# Patient Record
Sex: Male | Born: 1945
Health system: Southern US, Community
[De-identification: ages and names within clinical notes are randomized; demographics above are authoritative.]

## PROBLEM LIST (undated history)

## (undated) DIAGNOSIS — I2699 Other pulmonary embolism without acute cor pulmonale: Secondary | ICD-10-CM

## (undated) DIAGNOSIS — Z8489 Family history of other specified conditions: Secondary | ICD-10-CM

## (undated) DIAGNOSIS — R42 Dizziness and giddiness: Secondary | ICD-10-CM

## (undated) DIAGNOSIS — M87059 Idiopathic aseptic necrosis of unspecified femur: Secondary | ICD-10-CM

## (undated) DIAGNOSIS — Z7901 Long term (current) use of anticoagulants: Secondary | ICD-10-CM

## (undated) DIAGNOSIS — I214 Non-ST elevation (NSTEMI) myocardial infarction: Secondary | ICD-10-CM

## (undated) DIAGNOSIS — Z87442 Personal history of urinary calculi: Secondary | ICD-10-CM

## (undated) DIAGNOSIS — T883XXA Malignant hyperthermia due to anesthesia, initial encounter: Secondary | ICD-10-CM

## (undated) DIAGNOSIS — I82409 Acute embolism and thrombosis of unspecified deep veins of unspecified lower extremity: Secondary | ICD-10-CM

## (undated) DIAGNOSIS — E785 Hyperlipidemia, unspecified: Secondary | ICD-10-CM

## (undated) HISTORY — DX: Dizziness and giddiness: R42

## (undated) HISTORY — DX: Long term (current) use of anticoagulants: Z79.01

## (undated) HISTORY — PX: CATARACT EXTRACTION, BILATERAL: SHX1313

## (undated) HISTORY — DX: Other pulmonary embolism without acute cor pulmonale: I26.99

## (undated) HISTORY — DX: Acute embolism and thrombosis of unspecified deep veins of unspecified lower extremity: I82.409

---

## 2003-03-24 HISTORY — PX: COLONOSCOPY: SHX174

## 2004-02-28 ENCOUNTER — Ambulatory Visit (HOSPITAL_COMMUNITY): Admission: RE | Admit: 2004-02-28 | Discharge: 2004-02-28 | Payer: Self-pay | Admitting: Gastroenterology

## 2004-06-12 ENCOUNTER — Ambulatory Visit: Payer: Self-pay | Admitting: Cardiology

## 2004-06-17 ENCOUNTER — Ambulatory Visit: Payer: Self-pay

## 2006-03-23 HISTORY — PX: CERVICAL FUSION: SHX112

## 2006-07-15 ENCOUNTER — Inpatient Hospital Stay (HOSPITAL_COMMUNITY): Admission: RE | Admit: 2006-07-15 | Discharge: 2006-07-16 | Payer: Self-pay | Admitting: Neurosurgery

## 2007-03-24 DIAGNOSIS — I2699 Other pulmonary embolism without acute cor pulmonale: Secondary | ICD-10-CM

## 2007-03-24 DIAGNOSIS — I82409 Acute embolism and thrombosis of unspecified deep veins of unspecified lower extremity: Secondary | ICD-10-CM

## 2007-03-24 HISTORY — DX: Other pulmonary embolism without acute cor pulmonale: I26.99

## 2007-03-24 HISTORY — PX: BACK SURGERY: SHX140

## 2007-03-24 HISTORY — DX: Acute embolism and thrombosis of unspecified deep veins of unspecified lower extremity: I82.409

## 2007-11-08 ENCOUNTER — Emergency Department (HOSPITAL_COMMUNITY): Admission: EM | Admit: 2007-11-08 | Discharge: 2007-11-08 | Payer: Self-pay | Admitting: Emergency Medicine

## 2007-11-08 ENCOUNTER — Encounter: Admission: RE | Admit: 2007-11-08 | Discharge: 2007-11-08 | Payer: Self-pay | Admitting: Specialist

## 2007-11-11 ENCOUNTER — Inpatient Hospital Stay (HOSPITAL_COMMUNITY): Admission: AD | Admit: 2007-11-11 | Discharge: 2007-11-15 | Payer: Self-pay | Admitting: Family Medicine

## 2008-02-03 ENCOUNTER — Ambulatory Visit (HOSPITAL_COMMUNITY): Payer: Self-pay | Admitting: Oncology

## 2008-02-03 ENCOUNTER — Encounter (HOSPITAL_COMMUNITY): Admission: RE | Admit: 2008-02-03 | Discharge: 2008-03-04 | Payer: Self-pay | Admitting: Oncology

## 2008-02-13 ENCOUNTER — Ambulatory Visit: Payer: Self-pay | Admitting: Vascular Surgery

## 2008-02-28 ENCOUNTER — Ambulatory Visit: Payer: Self-pay | Admitting: Surgery

## 2008-02-28 ENCOUNTER — Ambulatory Visit (HOSPITAL_COMMUNITY): Admission: RE | Admit: 2008-02-28 | Discharge: 2008-02-28 | Payer: Self-pay | Admitting: Surgery

## 2008-03-02 ENCOUNTER — Ambulatory Visit: Payer: Self-pay | Admitting: Oncology

## 2008-03-02 ENCOUNTER — Inpatient Hospital Stay (HOSPITAL_COMMUNITY): Admission: AD | Admit: 2008-03-02 | Discharge: 2008-03-04 | Payer: Self-pay | Admitting: Specialist

## 2008-03-06 ENCOUNTER — Encounter (HOSPITAL_COMMUNITY): Admission: RE | Admit: 2008-03-06 | Discharge: 2008-04-05 | Payer: Self-pay | Admitting: Oncology

## 2008-03-23 HISTORY — PX: COLONOSCOPY: SHX174

## 2008-04-02 ENCOUNTER — Ambulatory Visit (HOSPITAL_COMMUNITY): Payer: Self-pay | Admitting: Oncology

## 2008-04-02 ENCOUNTER — Encounter (HOSPITAL_COMMUNITY): Admission: RE | Admit: 2008-04-02 | Discharge: 2008-05-02 | Payer: Self-pay | Admitting: Oncology

## 2008-04-16 ENCOUNTER — Ambulatory Visit: Payer: Self-pay | Admitting: Surgery

## 2008-05-15 ENCOUNTER — Ambulatory Visit (HOSPITAL_COMMUNITY): Admission: RE | Admit: 2008-05-15 | Discharge: 2008-05-15 | Payer: Self-pay | Admitting: Surgery

## 2008-05-15 ENCOUNTER — Ambulatory Visit: Payer: Self-pay | Admitting: Surgery

## 2008-09-01 ENCOUNTER — Emergency Department (HOSPITAL_COMMUNITY): Admission: EM | Admit: 2008-09-01 | Discharge: 2008-09-01 | Payer: Self-pay | Admitting: Emergency Medicine

## 2008-11-05 ENCOUNTER — Encounter: Payer: Self-pay | Admitting: Internal Medicine

## 2008-11-09 ENCOUNTER — Encounter (HOSPITAL_COMMUNITY): Admission: RE | Admit: 2008-11-09 | Discharge: 2008-12-09 | Payer: Self-pay | Admitting: Oncology

## 2008-11-09 ENCOUNTER — Ambulatory Visit (HOSPITAL_COMMUNITY): Payer: Self-pay | Admitting: Oncology

## 2008-11-23 ENCOUNTER — Ambulatory Visit: Payer: Self-pay | Admitting: Internal Medicine

## 2008-11-23 ENCOUNTER — Encounter: Payer: Self-pay | Admitting: Internal Medicine

## 2008-11-23 ENCOUNTER — Ambulatory Visit (HOSPITAL_COMMUNITY): Admission: RE | Admit: 2008-11-23 | Discharge: 2008-11-23 | Payer: Self-pay | Admitting: Internal Medicine

## 2008-11-29 ENCOUNTER — Encounter: Payer: Self-pay | Admitting: Internal Medicine

## 2009-06-27 IMAGING — CR DG CHEST 2V
2 series · 2 of 2 positions shown · non-contrast
Comparison: Portable chest x-ray of 11/12/2007

CLINICAL DATA: Preop for lumbar spine surgery

CHEST - 2 VIEW

[w chest pa *]
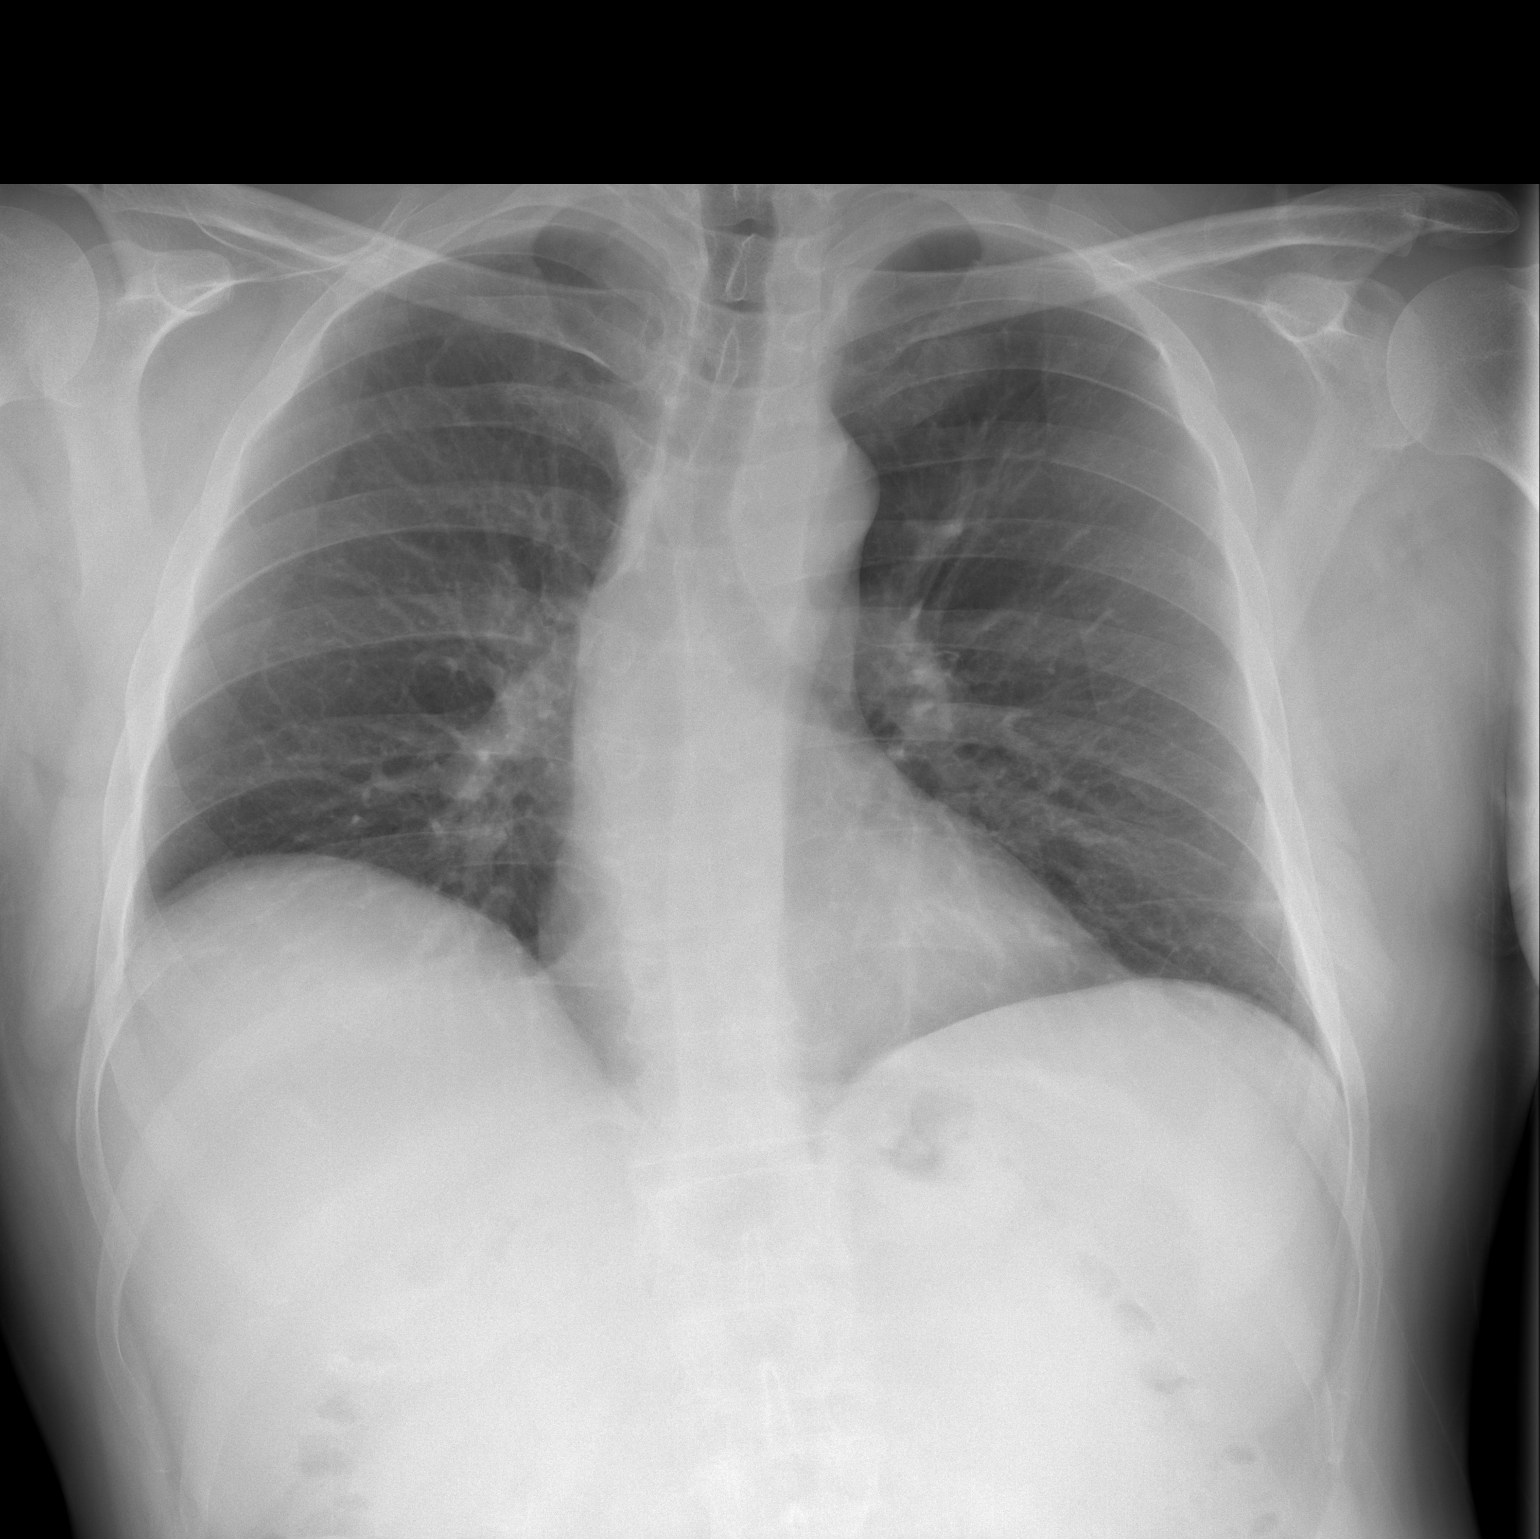

[w chest lat *]
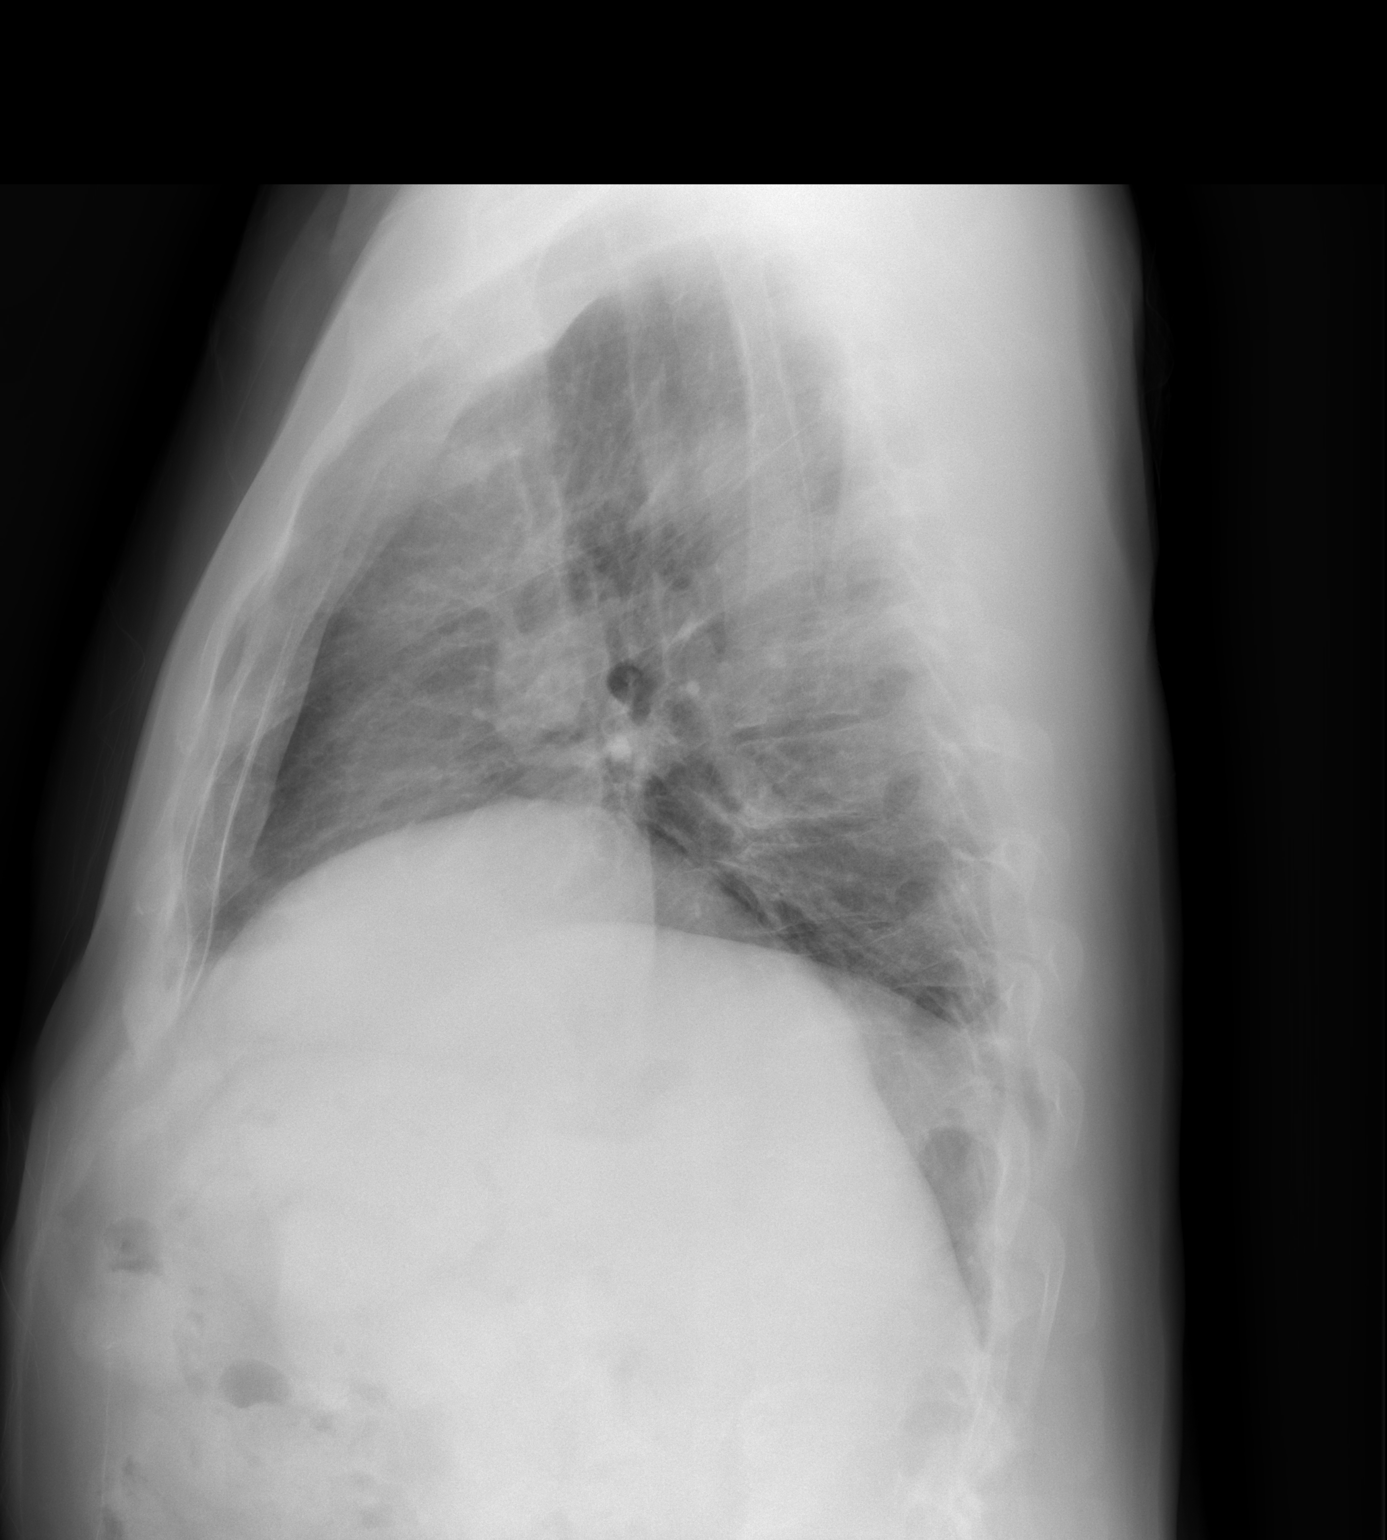

[2 of 2 positions shown; findings below may reference images not displayed]

FINDINGS: No active infiltrate or effusion is seen.  A vague
nodular opacity anteriorly in the retrosternal air space on the
lateral view probably is due to bony overlap, with no nodules seen
on the frontal view.  Attention on follow-up chest x-ray is
recommended.  The heart is within normal limits in size.  No acute
bony abnormality is seen.
IMPRESSION: No active lung disease.  Compare with follow-up chest x-ray.

## 2009-11-07 ENCOUNTER — Ambulatory Visit: Payer: Self-pay | Admitting: Surgery

## 2010-03-15 ENCOUNTER — Emergency Department (HOSPITAL_COMMUNITY)
Admission: EM | Admit: 2010-03-15 | Discharge: 2010-03-15 | Payer: Self-pay | Source: Home / Self Care | Admitting: Emergency Medicine

## 2010-06-02 LAB — DIFFERENTIAL
Basophils Relative: 0 % (ref 0–1)
Lymphocytes Relative: 31 % (ref 12–46)
Monocytes Absolute: 0.4 10*3/uL (ref 0.1–1.0)
Monocytes Relative: 9 % (ref 3–12)
Neutro Abs: 2.7 10*3/uL (ref 1.7–7.7)

## 2010-06-02 LAB — COMPREHENSIVE METABOLIC PANEL
Albumin: 3.9 g/dL (ref 3.5–5.2)
Alkaline Phosphatase: 60 U/L (ref 39–117)
BUN: 14 mg/dL (ref 6–23)
CO2: 29 mEq/L (ref 19–32)
Calcium: 9.1 mg/dL (ref 8.4–10.5)
Chloride: 104 mEq/L (ref 96–112)
GFR calc Af Amer: >60 mL/min (ref >60)
GFR calc non Af Amer: >60 mL/min (ref >60)
Glucose, Bld: 111 mg/dL — ABNORMAL HIGH (ref 70–99)

## 2010-06-02 LAB — CBC
Hemoglobin: 14.5 g/dL (ref 13.0–17.0)
MCHC: 35.1 g/dL (ref 30.0–36.0)
MCV: 84.8 fL (ref 78.0–100.0)
RBC: 4.87 MIL/uL (ref 4.22–5.81)
WBC: 4.7 10*3/uL (ref 4.0–10.5)

## 2010-06-28 LAB — PROTIME-INR
INR: 2.3 — ABNORMAL HIGH (ref 0.00–1.49)
Prothrombin Time: 25.4 seconds — ABNORMAL HIGH (ref 11.6–15.2)

## 2010-07-08 LAB — PROTIME-INR
INR: 1 (ref 0.00–1.49)
Prothrombin Time: 13.7 seconds (ref 11.6–15.2)

## 2010-07-08 LAB — POCT I-STAT, CHEM 8
Chloride: 104 mEq/L (ref 96–112)
HCT: 41 % (ref 39.0–52.0)
Hemoglobin: 13.9 g/dL (ref 13.0–17.0)
Sodium: 139 mEq/L (ref 135–145)
TCO2: 26 mmol/L (ref 0–100)

## 2010-08-05 NOTE — Procedures (Signed)
DUPLEX DEEP VENOUS EXAM - LOWER EXTREMITY   INDICATION:  Preop for IVC filter placement.   HISTORY:  Edema:  Yes.  Trauma/Surgery:  No.  Pain:  No.  PE:  Yes.  Previous DVT:  Yes.  Anticoagulants:  Yes (Coumadin).  Other:   DUPLEX EXAM:                CFV   SFV   PopV  PTV    GSV                R  L  R  L  R  L  R   L  R  L  Thrombosis    0  0  0  +  0  +  0   +  0  0  Spontaneous   +  +  +  0  +  0  +   0  +  +  Phasic        +  +  +  0  +  0  +   0  +  +  Augmentation  +  +  +  0  +  0  +   0  +  +  Compressible  +  +  +  0  +  0  +   0  +  +  Competent     +  +  +  0  +  0  +   0  +  +   Legend:  + - yes  o - no  p - partial  D - decreased   IMPRESSION:  1. Acute DVT with no flow noted in the left superficial femoral vein,      popliteal, posterior tibial and peroneal veins.  2. No evidence of DVT noted in the right leg.  3. Bilateral common femoral vein appears patent with phasic flow.     _____________________________  Quita Skye Hart Rochester, M.D.   MG/MEDQ  D:  02/13/2008  T:  02/13/2008  Job:  [id number]

## 2010-08-05 NOTE — H&P (Signed)
NAMEJACOBI, [name]               ACCOUNT NO.:  [id number]   MEDICAL RECORD NO.:  [id number]          PATIENT TYPE:  INP   LOCATION:  A335                          FACILITY:  [facility]   PHYSICIAN:  Donna Bernard, M.D.DATE OF BIRTH:  05-07-1945   DATE OF ADMISSION:  11/11/2007  DATE OF DISCHARGE:  LH                              HISTORY & PHYSICAL   CHIEF COMPLAINT:  Back pain, leg pain, and clots in legs.   SUBJECTIVE:  This patient is a 65 year old white male with relatively  benign prior medical history.  He presented to the office today of  admission as a new patient.  He had several concerns.  His most pressing  concern was unknown DVT in his left leg.  He had been seen several days  prior in [facility].  Ultrasound that time showed  extensive venous thrombosis in the leg and extending on through the  thigh.  The patient was started on Coumadin and Lovenox injections at  home.  He was followed at the family doctor's office in [place].  They  were still working on adjusting his dosaging.  The patient notes  compliance with medication, which include currently Coumadin 5 mg daily  and Lovenox 50 mg subcu b.i.d.  On further history, the patient notes he  has had significant sciatica pain since June.  He has been unable to  work since June, his usual job as a Archivist.  He has had severe  back pain radiating from his hip, went down to his left leg and foot.  At times, the pain is some quite severe.  He has received multiple  epidural injections for this without much help.  He recently has been  seen Dr. Jene Every in [place] with intention to do surgery on  this ruptured disk area soon.  On further history, the patient notes  that a week prior to his visit to the [facility], he had chest pain with  several days in duration posterior left side on the chest.  When pain  was difficult, it was quite sharp and definitely pleuritic.  The patient  notes over the  past week, he has had sporadic cough and when he does at  times it is productive of sputum and at times blood tinged.  The patient  currently feels no shortness of breath nor lightheadedness.   The patient 3 years ago had a reportedly negative colonoscopy.  He also  had a PSA, prostate, and lipid profile at that time   PRIOR SURGERIES:  None.   FAMILY HISTORY:  Positive for hypertension and coronary artery disease.  The patient has a sibling with cirrhosis from fatty liver.   ALLERGIES:  None known.   MEDICATIONS:  As noted above.   PRIOR SURGERIES:  None recent.   SOCIAL HISTORY:  The patient is married with children.  He is a Scientist, clinical (histocompatibility and immunogenetics).  He notes no tobacco or alcohol use.   REVIEW OF SYSTEMS:  Otherwise negative.   PHYSICAL EXAMINATION:  VITAL SIGNS:  Blood pressure 134/82, afebrile in  the office,  but the patient did spike a temperature of 100.8 on his  first measurement in the hospital, and pulse rate 90.  GENERAL:  Alert, some mild obesity.  HEENT:  Normal.  NECK:  Supple.  LUNGS:  Left basilar crackles.  No wheezes.  No tachypnea.  HEART:  No significant murmurs.  Heart rate in the range of 90, normal  rhythm.  ABDOMEN:  Benign.  EXTREMITIES:  Left leg, edema evident both in the upper leg and in the  calf.  Veins are distended diffusely.  Homans sign is negative.  There  is some tenderness along the medial thigh.  Straight leg raise is  distinctly positive on the left side.   LABORATORY DATA:  The patient was sent for labs.  CBC, white blood count  is normal and MET-7 is good.  INR 1.5.  EKG normal sinus rhythm.  No  significant ST-T changes.  CT angio of the chest reveal left lower lobe  pulmonary infarction, embolus, and a right lung pulmonary embolus, O2  sat 96% on room air.   IMPRESSION:  1. Multiple pulmonary emboli.  2. Severe deep venous thrombosis with significant changes of edema,      tenderness and pain.  3. Anticoagulation with having yet to  achieve full oral anticoagulant      status.  4. Ruptured disk.   PLAN:  Admit for Lovenox subcu, appropriate Coumadin dosaging, close  INR, strict bedrest, IV fluids, nasal cannula oxygen, and IV pain  medicine.  We will touch base with vascular surgeon to make sure we are  doing everything perfectly right.  Further orders as noted in the chart.      Donna Bernard, M.D.  Electronically Signed     WSL/MEDQ  D:  11/12/2007  T:  11/13/2007  Job:  [id number]   cc:   Jene Every, M.D.  Fax: [phone]

## 2010-08-05 NOTE — Op Note (Signed)
[name], [name]               ACCOUNT NO.:  [id number]   MEDICAL RECORD NO.:  [id number]          PATIENT TYPE:  OIB   LOCATION:  1611                         FACILITY:  [facility]   PHYSICIAN:  Jene Every, M.D.    DATE OF BIRTH:  1945-04-22   DATE OF PROCEDURE:  03/01/2008  DATE OF DISCHARGE:                               OPERATIVE REPORT   PREOPERATIVE DIAGNOSIS:  Spinal stenosis, HNP (herniated nucleus  pulposus) at 4-5 left.   POSTOPERATIVE DIAGNOSIS:  Spinal stenosis, HNP (herniated nucleus  pulposus) at 4-5 left.   PROCEDURE:  1. Central decompression L4-5.  2. Bilateral hemilaminotomy lateral recess decompression foraminotomy      of L5.  3. Microdiskectomy of 4-5.   ANESTHESIA:  General.   SURGEON:  Jene Every, M.D.   ASSISTANT:  Georges Lynch. Gioffre, M.D.   BRIEF HISTORY AND INDICATION:  A 65 year old with bilateral lower  extremity radiculopathy L4-5 nerve root distribution, spinal stenosis,  HNP, positive neural tension signs with EHL weakness refractory to  conservative treatment.  The patient had a pulmonary embolism  approximately 4 or 5 months ago, came off his Coumadin 1 week prior to  the procedure.  Coags normal.  Had a Greenfield filter placed.  Indicated for decompression.  Risks and benefits discussed including  bleeding, infection, damage to neurovascular structures, CSF leakage,  epidural fibrosis, adjacent segment disease, need for fusion in future,  anesthetic complications, etc.   TECHNIQUE:  With the patient in supine position after induction of  adequate general anesthesia and 1 gram of Kefzol lumbar region was  prepped and draped in the usual sterile fashion.  Two 18 gauge spinal  needles utilized to localize 4-5 interspace confirmed with x-ray.  Incision was made from the spinous process of 4-5.  Subcutaneous tissue  was dissected.  Electrocautery utilized to achieve hemostasis.  Dorsolumbar fascia identified and divided in line with skin  incision.  Paraspinous muscle elevated from lamina of 4 and 5.  Kochers were placed  and the spinous process of 4 and 5 confirmed by x-ray.  Operating  microscope draped and brought into the surgical field.  A central  decompression was performed and partial removal of the lamina of the  spinous process of 4 and 5.  Hemilaminotomy bilaterally was performed  with the 2 mL Kerrison detaching ligamentum flavum, preserving the pars  at 4, also detaching the cephalad edge of 5 bilaterally.  Severe  stenosis was noted bilaterally especially in the lateral recess of 4-5  on the left.  Epidural venous plexus was noted and then small HNP focal  compressing the 5 root.  The lateral recess was decompressed from the  medial border of the pedicle with the 2 mm Kerrison.  A foraminotomy of  5 was performed and in addition this was performed on the contralateral  side.  There was no disk herniation at 4-5 on the right.  Following this  there was good restoration of the sac and good mobility of the fiber at  least a centimeter medial to the pedicle and a hockey stick probe was  placed  freely out the foramen of 4 and 5 and cephalad to the 4 pedicle.  No residual stenosis was noted.  Disk space was copiously irrigated with  antibiotic irrigation.  Inspection revealed no evidence of CSF leakage  or active bleeding.  Bone wax placed in the cancellous surfaces.  Wound  was copiously irrigated.  Thrombin-soaked Gelfoam into the laminotomy  defect.  McCullough retractor was removed.  Paraspinous muscle was  inspected and no evidence of active bleeding.  Copiously irrigated.  Dorsolumbar fascia reapproximated with #1 Vicryl interrupted figure-of-  eight sutures.  Subcutaneous tissue reapproximated with 2-0 Vicryl  simple sutures.  Skin was reapproximated with staples.  The wound was  dressed sterilely.  The patient was placed supine on the hospital bed,  extubated without difficulty and transported to the  recovery room in  satisfactory condition.  The patient tolerated the procedure with no  complications.  Minimal blood loss.      Jene Every, M.D.  Electronically Signed     JB/MEDQ  D:  03/01/2008  T:  03/01/2008  Job:  [id number]   cc:   Ladona Horns. Mariel Sleet, MD  Fax: [phone]

## 2010-08-05 NOTE — Op Note (Signed)
[name], [name]               ACCOUNT NO.:  [id number]   MEDICAL RECORD NO.:  [id number]          PATIENT TYPE:  AMB   LOCATION:  SDS                          FACILITY:  [facility]   PHYSICIAN:  VDurene Cal IV, MDDATE OF BIRTH:  08/19/1945   DATE OF PROCEDURE:  DATE OF DISCHARGE:  02/28/2008                               OPERATIVE REPORT   PREOPERATIVE DIAGNOSIS:  Deep vein thrombosis/pulmonary embolism.   POSTOPERATIVE DIAGNOSIS:  Deep vein thrombosis/pulmonary embolism.   PROCEDURE PERFORMED:  1. Ultrasound access right common femoral vein.  2. Catheter in inferior vena cava.  3. Inferior vena cavogram.  4. Inferior vena cava filter placement.   INDICATIONS:  This is a 65 year old gentleman who comes in for  preoperative filter placement for back surgery.  He has been bridged  from his Coumadin with Lovenox.  Risks and benefits were discussed.  Informed consent was signed.   PROCEDURE:  The patient was identified in the holding area and taken to  room 8.  He was placed supine on the table.  The right groin was prepped  and draped in a standard sterile fashion.  The time-out was called.  The  right femoral vein was evaluated with ultrasound and was found to be  easily compressible and widely patent.  1% lidocaine was used for local  anesthesia.  Using an 18-gauge needle, the right common femoral vein was  accessed under ultrasound guidance.  A 035 Benson wire was advanced in  retrograde fashion into the inferior vena cava under fluoroscopic  visualization.  Over the wire, an Omni flush catheter was placed at the  IVC bifurcation and an IVC venogram was performed.  There was no IVC  thrombus.  There were no aberrant renal veins.  The diameter of the  inferior vena cava was acceptable.  Next, over a Rosen wire, the filter  introducing sheath was advanced at the level of L1.  The introducer and  wire were removed and a Bard G2x was advanced through the sheath.  The  filter  was then deployed with the tip of the filter between the L1-L2  vertebral body.  Filter was deployed in straight position.  Next, the  sheath was withdrawn and manual pressure was held for hemostasis.   The patient was taken to the holding area in stable condition.  There  were no complications.   IMPRESSION:  Successful placement of a Bard G2x IVC filter.  If the  filter needs to be removed, it should be done so within the next 3  months.           ______________________________  V. Charlena Cross, MD  Electronically Signed     VWB/MEDQ  D:  02/28/2008  T:  02/28/2008  Job:  [id number]   cc:   Ladona Horns. Mariel Sleet, MD  Leighton Roach Truett Perna, M.D.  Jene Every, M.D.

## 2010-08-05 NOTE — Op Note (Signed)
NAMEDEAKEN, JURGENS               ACCOUNT NO.:  [id number]   MEDICAL RECORD NO.:  [id number]          PATIENT TYPE:  AMB   LOCATION:  SDS                          FACILITY:  [facility]   PHYSICIAN:  VDurene Cal IV, MDDATE OF BIRTH:  1945/06/25   DATE OF PROCEDURE:  05/15/2008  DATE OF DISCHARGE:                               OPERATIVE REPORT   PREOPERATIVE DIAGNOSIS:  Deep venous thrombosis/pulmonary embolism.   POSTOPERATIVE DIAGNOSIS:  Deep venous thrombosis/pulmonary embolism.   PROCEDURE PERFORMED:  1. Inferior vena cava filter removal.  2. Right internal jugular vein access with ultrasound.  3. Inferior venacavogram.  4. Catheter in inferior vena cava.   INDICATIONS:  Mr. Platte is a 65 year old gentleman who had undergone  placement of a Bard G2X IVC filter prior to his back surgery.  He had  been maintained on Coumadin for the history of DVT and PE.  He has  successfully come through his operation and is now ready for IVC filter  removal.  Ultrasound in the office revealed no new DVT in his lower  extremities.  He comes in today for filter removal.   PROCEDURE IN DETAIL:  The patient was identified in the holding and  taken to room 8.  He was placed supine on the table.  The right neck was  prepped and draped in standard sterile fashion.  Time-out was called.  The right internal jugular vein was evaluated with ultrasound and found  to be widely patent, easily compressible, and was accessed under  ultrasound guidance with a micropuncture needle.  An 0.018 mandrill wire  was advanced into the right atrium under fluoroscopic visualization, and  a micropuncture sheath was placed.  Next, a Bentson wire was placed.  The micropuncture catheter was removed, and an Omni flush catheter was  advanced bareback over the wire and used to navigate the wire into the  inferior vena cava.  The Omni flush catheter was placed into the IVC  bifurcation, and IVC venogram was performed.  This  revealed no evidence  of IVC thrombus.   At this point, the Omni flush catheter was removed, and a 7-French  Terumo sheath was placed.  This was followed by 25-mm snare.  The snare  was used to grasp the hook of the filter, which was then withdrawn into  the sheath without difficulty.  The filter was then removed.  There were  no complications.  The 7-French sheath was removed at the bedside.  Manual compression was held until hemostasis was achieved.  There were  no complications.   IMPRESSION:  1. No evidence of inferior vena cava thrombus.  2. Successful removal inferior vena cava filter.           ______________________________  V. Charlena Cross, MD  Electronically Signed     VWB/MEDQ  D:  05/15/2008  T:  05/15/2008  Job:  [id number]   cc:   Ladona Horns. Mariel Sleet, MD  Leighton Roach Truett Perna, M.D.  Jene Every, M.D.

## 2010-08-05 NOTE — Discharge Summary (Signed)
[name], [name]               ACCOUNT NO.:  [id number]   MEDICAL RECORD NO.:  [id number]          PATIENT TYPE:  INP   LOCATION:  1611                         [facility]   PHYSICIAN:  Jene Every, M.D.    DATE OF BIRTH:  1945-08-23   DATE OF ADMISSION:  03/01/2008  DATE OF DISCHARGE:  03/04/2008                               DISCHARGE SUMMARY   ADMISSION DIAGNOSES:  1. Spinal stenosis L4-5.  2. Recent history of deep venous thrombosis and pulmonary embolism      currently with a filter placed.  3. Hypokalemia.   DISCHARGE DIAGNOSES:  1. Spinal stenosis L4-5.  2. Recent history of deep venous thrombosis and pulmonary embolism      currently with a filter placed.  3. Hypokalemia.  4. Status post lumbar decompression L4-5.   HISTORY:  [name] is a pleasant 65 year old gentleman who noted onset  of back and lower extremity symptoms.  Studies revealed fairly severe  stenosis at 4-5.  While waiting to undergo surgical intervention the  patient unfortunately developed a blood clot that progressed to  bilateral PEs.  He underwent anticoagulation therapy.  Unfortunately  noted worsening of symptoms.  Consulted with hematology and vascular  surgeon.  A Greenfield was placed.  He was weaned from his  anticoagulants and surgery was scheduled.   PROCEDURE:  The patient was taken to the OR, underwent lumbar  decompression L4-5.  Surgeon Dr. Jene Every.  Assistant Ranee Gosselin.  Anesthesia general.  Complications none.  Consults with PT,  OT, and Hematology.   LABORATORY:  Preoperative CBC showed a white cell count 4.7, hemoglobin  14.9, hematocrit 45.0.  These were monitored throughout the hospital  course.  White cell count remained within normal range.  Hemoglobin was  stable, at time of discharge 12.8, hematocrit 37.9.  Coagulation studies  done preoperatively which showed PT 12.8, INR of 1.0, PTT of 33.  This  was repeated at time of discharge with INR of 1.1.   Routine chemistries  were obtained preoperatively showed sodium 141, potassium 4.0, BUN and  creatinine within normal range.  These remained stable throughout the  hospital stay.  Routine liver function tests showed slightly elevated  ALT at 54.  Cardiac markers were negative.  Preoperative urinalysis was  negative.  Preoperative EKG showed normal sinus rhythm, left ventricular  hypertrophy noted, inferior infarct age undetermined.  Preoperative  chest x-ray showed no active lung disease.   HOSPITAL COURSE:  The patient was admitted, taken to the OR and  underwent the above stated procedure without difficulty.  He was then  transferred to the PACU and then to the orthopedic floor for continued  postoperative care.  Hematology was consulted due to his history of DVT  and PE and the resumption of his Lovenox and Coumadin.  On postop day #1  the patient did complain of some low back pain.  He had been out of bed  without significant difficulty.  He was voiding.  He did have some mild  nausea secondary to medication.  He denied any headache or dizziness.  Vital signs were stable.  He was afebrile.  Lab work was stable.  PT and  OT was initiated and slowly advanced.  Hematology did follow along with  [place].  Currently he has an IVC filter in place.  Coumadin and Lovenox were  started postop day #2.  Discharge planning was initiated.  During the  hospital stay the patient continued do well.  He was stable.  No signs  or symptoms of DVT.  Incision remained clean and dry.  Motor and  neurovascular function remained intact.  Compartments soft.  On postop  day #3 it was felt the patient was stable to be discharged home.   DISPOSITION:  The patient is stable for discharge home with any home  health therapy needs.   ACTIVITIES:  He is to walk as tolerated using back precautions.   DIET:  High fiber, low carb.   DISCHARGE MEDICATIONS:  1. Lovenox 40 mg subcu daily until therapeutic on Coumadin.   2. Currently he is on Coumadin 5 mg daily.   He is to follow up with Dr. Shelle [name] in approximately 10 to 14 days for  suture removal.  He is to follow up with Dr. Mariel [name] for management of  his Coumadin.   WOUND CARE:  Change dressing daily.  Okay for him to shower.   CONDITION ON DISCHARGE:  Stable.   FINAL DIAGNOSIS:  Doing well status post lumbar decompression L4-5.      [name], P.A.      Jene Every, M.D.  Electronically Signed    CS/MEDQ  D:  04/04/2008  T:  04/04/2008  Job:  [id number]

## 2010-08-05 NOTE — Procedures (Signed)
DUPLEX DEEP VENOUS EXAM - LOWER EXTREMITY   INDICATION:  Follow up left lower extremity DVT.   HISTORY:  Edema:  Left lower extremity.  Trauma/Surgery:  IVC filter on 02/28/08 by Dr. Myra Gianotti.  Pain:  No.  PE:  Yes.  Previous DVT:  On 02/13/08, left SFV, pop V, PTV, and peroneal V.  Anticoagulants:  Coumadin.  Other:   DUPLEX EXAM:                CFV   SFV   PopV   PTV   GSV                R  L  R  L  R  L   R  L  R  L  Thrombosis    o  o  o  +  o  +   o  +  o  o  Spontaneous   +  +  +  o  +  D   +  P  +  +  Phasic        +  +  +  o  +  D   +  P  +  +  Augmentation  +  +  +  o  +  D   +  P  +  +  Compressible  +  +  +  o  +  P   +  P  +  +  Competent     +  +  +  o  +  D   +  P  +  +   Legend:  + - yes  o - no  p - partial  D - decreased   IMPRESSION:  1. Right lower extremity shows no evidence of deep venous thrombosis.  2. Left lower extremity shows evidence of total occlusion, chronic      deep venous thrombosis in superficial femoral vein, and partially      occluding chronic in profunda, popliteal, posterior tibial, and      peroneal veins.  3. All other imaged veins appear patent.        _____________________________  V. Charlena Cross, MD   AS/MEDQ  D:  04/16/2008  T:  04/16/2008  Job:  [id number]

## 2010-08-05 NOTE — Assessment & Plan Note (Signed)
OFFICE VISIT   [name], Steven L  DOB:  1945/11/23                                       04/16/2008  CHART#:[id number]   REASON FOR VISIT:  Follow-up IVC filter.   HISTORY:  This is a 65 year old gentleman who is status post bilateral  pulmonary emboli and left leg DVT in August 2009.  He had been on  Coumadin.  He was scheduled to have lumbar laminectomy by Dr. Shelle Iron  which was performed on December 10th.  Prior to that, he underwent  placement of an IVC filter on February 28, 2008.  A Bard G2 X filter was  placed.  Patient successfully underwent his surgery and now comes in  today for possible filter removal.   PHYSICAL EXAMINATION:  Vital Signs:  Blood pressure is 152/88, pulse  106.  He is well-appearing, in no distress.  Legs are warm and well  perfused.  Cardiovascular:  Regular rate and rhythm.   DIAGNOSTIC STUDIES:  Ultrasound shows no evidence of DVT in the right  leg, there is evidence of a total occlusion and chronic DVT in the left  superficial femoral vein and a partially occluding chronic thrombus  within the profunda, popliteal, posterior tibial and peroneal veins.   ASSESSMENT/PLAN:  Deep vein thrombosis, pulmonary embolism.   Plan:  The patient has successfully recovered from his lumbar  laminectomy.  I think it is safe to proceed with IVC filter removal.  I  am scheduling this for February 23rd.  I have told him to stop taking  his Coumadin on February 18th, I have given him a prescription for  Lovenox, he will start taking his Lovenox on February 21st.  I have  given him 10 doses.  We will restart his Coumadin and Lovenox the night  following his filter removal.   Juleen China IV, MD  Electronically Signed   VWB/MEDQ  D:  04/16/2008  T:  04/17/2008  Job:  [id number]   cc:   Jene Every, M.D.  Ladona Horns. Mariel Sleet, MD  Donna Bernard, M.D.

## 2010-08-05 NOTE — Consult Note (Signed)
VASCULAR SURGERY CONSULTATION   Rainey, Juddson L  DOB:  26-Nov-1945                                       02/13/2008  CHART#:[id number]   The patient is a 65 year old male patient who suffered a pulmonary  embolism and left leg deep vein thrombosis in August of this year, 2009.  He was in [place] in early August and then drove by car to [place],  and then returned from [place] to [place] by plane.  Shortly  thereafter, he saw Dr. Shelle Iron on August 10, who scheduled lumbar  laminectomy surgery on August 26.  He developed chest pain and left leg  swelling, and diagnosis of pulmonary embolism by CT angiography of the  chest, as well as a left common superficial femoral, profunda femoral,  and popliteal DVT in the left leg.  He has been treated with Lovenox and  then Coumadin appropriately since that time, and now is scheduled for  his lumbar surgery by Dr. Shelle Iron.  He was referred for evaluation for  placement of a temporary IVC filter for protection in the perioperative  period, since the Coumadin will be discontinued.  He has no history of  previous clotting problems, pulmonary embolism, deep vein thrombosis, or  other vascular-type problems.   PAST MEDICAL HISTORY:  Negative for diabetes, hypertension, coronary  artery disease, chronic obstructive pulmonary disease, or stroke.   PREVIOUS SURGERY:  Includes cervical spine surgery by Dr. Franky Macho in  2008.   FAMILY HISTORY:  Positive for coronary artery disease, diabetes, and  stroke in his mother.  Otherwise, unremarkable.   SOCIAL HISTORY:  He is married and has 2 children.  Works as a Scientist, clinical (histocompatibility and immunogenetics).  He has never used tobacco.  He does not use alcohol.   REVIEW OF SYSTEMS:  Unremarkable.  No specific complaints, other than  the present illness.   MEDICATIONS:  Include Coumadin and potassium.   ALLERGIES:  None known.   PHYSICAL EXAM:  Blood pressure 141/91, heart rate is 87, respirations  14,  temperature 97.2.  Generally, he is a healthy-appearing middle-aged  male in no apparent distress.  Alert and oriented x3.  Neck is supple.  3+ carotid pulses palpable.  No bruits are audible.  Neurologic exam is  normal.  No palpable adenopathy in the neck.  Chest clear to  auscultation.  Cardiovascular exam is a regular rhythm.  No murmurs.  Abdomen is soft and nontender with no masses.  He has 3+ femoral,  popliteal, dorsalis pedis, and posterior tibial pulses palpable  bilaterally.  Left leg has 1 to 2+ edema from the mid thigh to the  ankle.  He has a lower leg elastic compression stocking in place on the  left.  The right leg is free of edema or ischemic changes, or chronic  venous insufficiency changes.   Venous duplex exam was performed in our office today, which reveals  continued thrombosis of the left superficial femoral, common femoral,  profunda femoris venous systems on the left side.  The right leg is free  of any venous obstruction, and there is phasic flow, suggesting a patent  proximal system (IVC).   I think he is a good candidate for insertion of a removable IVC filter  prior to his lumbar surgery.  I will coordinate this with Dr. Durene Cal, who will perform  the procedure, and Dr. Shelle Iron, and will  organize discontinuing his Coumadin about 7 days prior to his surgical  procedure.  The IVC filter can then be removed in a few months, barring  no embolic complications.   Quita Skye Hart Rochester, M.D.  Electronically Signed  JDL/MEDQ  D:  02/13/2008  T:  02/14/2008  Job:  [id number]   cc:   Ladona Horns. Mariel Sleet, MD  Leighton Roach Truett Perna, M.D.  Jene Every, M.D.

## 2010-08-08 NOTE — Discharge Summary (Signed)
[name], [name]               ACCOUNT NO.:  [id number]   MEDICAL RECORD NO.:  [id number]          PATIENT TYPE:  INP   LOCATION:  A335                          FACILITY:  [facility]   PHYSICIAN:  Donna Bernard, M.D.DATE OF BIRTH:  [birth year]   DATE OF ADMISSION:  11/11/2007  DATE OF DISCHARGE:  08/25/2009LH                               DISCHARGE SUMMARY   FINAL DIAGNOSES:  1. Pulmonary embolus.  2. Deep vein thrombosis.  3. Anticoagulation.  4. Fatty liver.  5. Sciatica with disk disease.   FINAL DISPOSITION:  The patient discharged to home.   DISCHARGE MEDICATIONS:  1. Coumadin 5 mg one-half tablets Tuesday, Thursday, Saturday, 1      tablet all other days.  2. Vicodin p.r.n. for pain.  3. Follow up in the office next week.  4. Avoid anti-inflammatories meds and education information given      regarding use of Coumadin.   INITIAL HISTORY AND PHYSICAL:  Please see H&P as dictated.   HOSPITAL COURSE:  This patient is a 65 year old male who presents to my  office the day of admission as a new patient.  He had several concerns  and history of DVT in his leg and extended all the way up to his thigh  and his groin.  On further history, the patient had intermittent  coughing and had noted some sharp pain.  The pain was worse with deep  breath.  The patient noted he had been coughing up some blood at times.  We went ahead and did a scan of the chest.  This revealed a small  pulmonary infarction in left base and pulmonary embolus in both lung  fields.  The patient was placed on therapeutic Lovenox injections.  Coumadin was initiated.  Appropriate analgesia was administered over the  next several days.  The patient improves on the day of discharge.      Donna Bernard, M.D.  Electronically Signed     WSL/MEDQ  D:  12/15/2007  T:  12/16/2007  Job:  [id number]

## 2010-08-08 NOTE — Op Note (Signed)
[name], [name]               ACCOUNT NO.:  [id number]   MEDICAL RECORD NO.:  [id number]          PATIENT TYPE:  INP   LOCATION:  3172                         FACILITY:  [facility]   PHYSICIAN:  Coletta Memos, M.D.     DATE OF BIRTH:  Jun 16, 1945   DATE OF PROCEDURE:  07/15/2006  DATE OF DISCHARGE:                               OPERATIVE REPORT   PREOPERATIVE DIAGNOSIS:  1. Displaced disc, left C6-C7.  2. Spondylosis C5-C6.  3. Left cervical C7 radiculopathy.   POSTOPERATIVE DIAGNOSES:  1. Displaced disc, left C6-C7.  2. Spondylosis C5-C6.  3. Left cervical C7 radiculopathy.   PROCEDURE:  1. Anterior cervical decompression C5-C6 and C6-C7.  2. Arthrodesis C5-C6 with 6 mm allograft and 7 mm allograft placed at      C6-C7.  3. Anterior instrumentation Vector plate with 14 mm screws two screws      at C5, C6 and C7.   COMPLICATIONS:  None.   SURGEON:  Coletta Memos, M.D.   ASSISTANT:  None.   ANESTHESIA:  General endotracheal.   INDICATIONS:  [name] is a 65 year old gentleman who presented  with severe pain in the left upper extremity and weakness in the left  triceps.  MRI showed a large herniated disc at C6-C7 and spurring and  foraminal narrowing on the left side at C5-C6.  I therefore recommended  and he agreed to undergo operative decompression at both levels.   OPERATIVE NOTE:  [name] was brought to the operating room,  intubated, and placed under a general anesthetic without difficulty.  He  had his neck positioned on a horseshoe headrest in a neutral position.  His neck was prepped and he was draped in a sterile fashion.  I  infiltrated 4 mL 0.5% lidocaine with 1:200,000 epinephrine into the  cervical region where my incision would be placed.  This started at the  midline and extended to the medial border of the sternocleidomastoid at  the level of the cricothyroid membrane and cricoid cartilage.  I opened  the skin with a #10 blade and took this  down to the platysma sharply.  I  dissected in the plane superior to the platysma rostrally and caudally.  I opened the platysma in a horizontal fashion using Metzenbaum scissors.  I then dissected in a plane inferior to the platysma rostrally and  caudally.  With both sharp and blunt dissection, I dissected an  avascular plane between the sternocleidomastoid muscle and the medial  strap muscles.  I retracted the omohyoid laterally.  I then was able to  expose the cervical spine.  I placed spinal needles for localization.  Once I confirmed the C5-C6 level, I then reflected the longus colli  muscles from C5 to C7 bilaterally.  I placed a self-retaining retractor.  I opened both disc spaces with a #15 blade and used a curet and  pituitary rongeurs to remove some disc material.  I then placed  distraction pins, one of C5 and the other at C6.  I brought the  microscope into the operative field and I proceeded with the  decompression of C5-C6.   I decompressed the C5-C6 disc space using a high speed drill, Kerrison  punches, and a pituitary rongeur.  I also used curets to remove soft  tissues from the bony edges.  There was a great deal of narrowing and  osteophyte formation present so I did quite a bit of drilling.  I was  able fully decompressed the disc space and both C6 nerve roots without  difficulty.  I irrigated the wound.  I then prepared the disc space for  arthrodesis.  I performed an arthrodesis at C5-C6 first by drilling down  the endplates and removing all soft tissue.  I then placed a 6 mm  Synthes ACF graft into the disc space.  I then turned my attention to  the C6-C7 level.   I removed the distraction pin at C5 and placed it into the vertebral  body of C7.  I distracted the C6-C7 disc space and proceeded with the  decompression of the C6-C7 level.  I decompressed the C6-C7 level using  a high speed drill, Kerrison punches, curets, along with pituitary  rongeurs.  I removed  multiple fragments of disc material overlying the  C7 nerve root on the left side.  I fully decompressed both C7 nerve  roots using the Kerrison punch and drill.  After thorough decompression  of the spinal canal, spinal cord, and of the C7 nerve roots, I then  turned my attention to arthrodesis.  I prepared the endplates for  arthrodesis by using a drill and curets.  I removed soft tissue.  I then  placed a 7 mm graft Synthes ACF into the disc space for the arthrodesis.   After placing both bone graft, I placed the anterior instrumentation.  I  used a 32 mm plate from [place] system.  This was by Synthes.  Two screws  were placed in C6, two screws in C5, two screws in C7.  The plate, plugs  and screws appeared to be in good position.  I could see the C5-C6 space  well on the x-ray but the C6-C7 space was obscured by soft tissue.  I  irrigated the wound.  I did achieve hemostasis.  I then closed the wound  in a layered fashion using Vicryl sutures to reapproximate the platysma  and subcuticular layer on the skin.  I used Dermabond for a sterile  dressing.  The patient was awakened, extubated, moving all extremities  postoperatively.           ______________________________  Coletta Memos, M.D.     KC/MEDQ  D:  07/15/2006  T:  07/15/2006  Job:  [id number]

## 2010-08-08 NOTE — Op Note (Signed)
NAMEKINGSTEN, ENFIELD               ACCOUNT NO.:  [id number]   MEDICAL RECORD NO.:  [id number]          PATIENT TYPE:  AMB   LOCATION:  ENDO                         FACILITY:  [facility]   PHYSICIAN:  John C. Madilyn Fireman, M.D.    DATE OF BIRTH:  1945/05/30   DATE OF PROCEDURE:  02/28/2004  DATE OF DISCHARGE:                                 OPERATIVE REPORT   PROCEDURE:  Colonoscopy.   INDICATIONS FOR PROCEDURE:  Average risk colon cancer screening in a 65-year-  old patient with no previous screening.   DESCRIPTION OF PROCEDURE:  The patient was placed in the left lateral  decubitus position and placed on the pulse monitor with continuous low-flow  oxygen delivered by nasal cannula.  He was sedated with 75 mcg IV fentanyl  and 8 mg IV Versed.  The Olympus video colonoscope was inserted into the  rectum and advanced to the cecum, confirmed by transillumination at  McBurney's point and visualization of the ileocecal valve and appendiceal  orifice.  The prep was good.  The cecum, ascending, transverse, descending,  and sigmoid colon all appeared normal with no masses, polyps, diverticula,  or other mucosal abnormalities.  The rectum likewise appeared normal, and  retroflexed view of the anus revealed no obvious internal hemorrhoids.  The  scope was then withdrawn, and the patient returned to the recovery room in  stable condition.  He tolerated the procedure well, and there were no  immediate complications.   IMPRESSION:  Normal colonoscopy.   PLAN:  Next colon screening by sigmoidoscopy in 5 years.      JCH/MEDQ  D:  02/28/2004  T:  02/28/2004  Job:  [id number]   cc:   Ernestina Penna, M.D.  [address] [place]  [place] [address]  Fax: [phone]

## 2010-12-23 LAB — COMPREHENSIVE METABOLIC PANEL
ALT: 26
Albumin: 4.1
BUN: 12
Chloride: 105
GFR calc non Af Amer: >60
Glucose, Bld: 89
Potassium: 3.3 — ABNORMAL LOW

## 2010-12-23 LAB — PROTEIN C, TOTAL: Protein C, Total: 50 % — ABNORMAL LOW (ref 70–140)

## 2010-12-23 LAB — CBC
HCT: 42.6
RBC: 5.03

## 2010-12-23 LAB — CARDIOLIPIN ANTIBODIES, IGG, IGM, IGA: Anticardiolipin IgA: 12 — ABNORMAL LOW (ref <13)

## 2010-12-23 LAB — LUPUS ANTICOAGULANT PANEL: dRVVT Incubated 1:1 Mix: 38.7 (ref 36.1–47.0)

## 2010-12-23 LAB — PROTEIN S, TOTAL: Protein S Ag, Total: 71 % (ref 70–140)

## 2010-12-23 LAB — BETA-2-GLYCOPROTEIN I ABS, IGG/M/A
Beta-2 Glyco I IgG: 5 U/mL (ref <20)
Beta-2-Glycoprotein I IgA: <4 U/mL (ref <10)
Beta-2-Glycoprotein I IgM: <4 U/mL (ref <10)

## 2010-12-23 LAB — DIFFERENTIAL
Basophils Absolute: 0
Eosinophils Relative: 2
Lymphocytes Relative: 42
Lymphs Abs: 2.2
Monocytes Absolute: 0.5

## 2010-12-23 LAB — ANTITHROMBIN III: AntiThromb III Func: 87 (ref 76–126)

## 2010-12-26 LAB — CBC
HCT: 39.8 % (ref 39.0–52.0)
HCT: 45 % (ref 39.0–52.0)
Hemoglobin: 12.8 g/dL — ABNORMAL LOW (ref 13.0–17.0)
Hemoglobin: 13.4 g/dL (ref 13.0–17.0)
Hemoglobin: 14.9 g/dL (ref 13.0–17.0)
MCHC: 33.1 g/dL (ref 30.0–36.0)
MCHC: 33.6 g/dL (ref 30.0–36.0)
MCHC: 33.7 g/dL (ref 30.0–36.0)
MCV: 84.9 fL (ref 78.0–100.0)
Platelets: 196 10*3/uL (ref 150–400)
Platelets: 200 10*3/uL (ref 150–400)
RBC: 4.47 MIL/uL (ref 4.22–5.81)
RBC: 5.27 MIL/uL (ref 4.22–5.81)
RDW: 12.8 % (ref 11.5–15.5)
RDW: 13.5 % (ref 11.5–15.5)
RDW: 13.6 % (ref 11.5–15.5)

## 2010-12-26 LAB — COMPREHENSIVE METABOLIC PANEL
ALT: 54 U/L — ABNORMAL HIGH (ref 0–53)
Albumin: 4 g/dL (ref 3.5–5.2)
Alkaline Phosphatase: 54 U/L (ref 39–117)
BUN: 13 mg/dL (ref 6–23)
Chloride: 104 mEq/L (ref 96–112)
Potassium: 4 mEq/L (ref 3.5–5.1)
Sodium: 141 mEq/L (ref 135–145)
Total Bilirubin: 0.5 mg/dL (ref 0.3–1.2)

## 2010-12-26 LAB — PROTIME-INR
INR: 1 (ref 0.00–1.49)
INR: 1.1 (ref 0.00–1.49)
INR: 1.5 (ref 0.00–1.49)
INR: 2.2 — ABNORMAL HIGH (ref 0.00–1.49)
Prothrombin Time: 14 seconds (ref 11.6–15.2)
Prothrombin Time: 14.2 seconds (ref 11.6–15.2)
Prothrombin Time: 24.3 seconds — ABNORMAL HIGH (ref 11.6–15.2)
Prothrombin Time: 25.7 seconds — ABNORMAL HIGH (ref 11.6–15.2)
Prothrombin Time: 28.4 seconds — ABNORMAL HIGH (ref 11.6–15.2)

## 2010-12-26 LAB — BASIC METABOLIC PANEL
BUN: 8 mg/dL (ref 6–23)
CO2: 26 mEq/L (ref 19–32)
Chloride: 98 mEq/L (ref 96–112)
GFR calc non Af Amer: >60 mL/min (ref >60)
Glucose, Bld: 128 mg/dL — ABNORMAL HIGH (ref 70–99)
Potassium: 3.6 mEq/L (ref 3.5–5.1)
Sodium: 136 mEq/L (ref 135–145)

## 2010-12-26 LAB — URINALYSIS, ROUTINE W REFLEX MICROSCOPIC
Bilirubin Urine: NEGATIVE
Glucose, UA: NEGATIVE mg/dL
Ketones, ur: NEGATIVE mg/dL
pH: 6 (ref 5.0–8.0)

## 2010-12-26 LAB — CK TOTAL AND CKMB (NOT AT ARMC): CK, MB: 1.1 ng/mL (ref 0.3–4.0)

## 2012-06-21 ENCOUNTER — Other Ambulatory Visit: Payer: Self-pay | Admitting: Family Medicine

## 2012-06-21 ENCOUNTER — Other Ambulatory Visit: Payer: Self-pay | Admitting: *Deleted

## 2012-06-21 MED ORDER — WARFARIN SODIUM 5 MG PO TABS: 5.0000 mg | ORAL_TABLET | Freq: Every day | ORAL | Status: DC

## 2012-06-21 NOTE — Telephone Encounter (Signed)
Pt last INR was 05/09/12, with return advice to recheck in 3-4 weeks.

## 2012-06-25 ENCOUNTER — Other Ambulatory Visit: Payer: Self-pay | Admitting: *Deleted

## 2012-06-25 DIAGNOSIS — Z7901 Long term (current) use of anticoagulants: Secondary | ICD-10-CM

## 2012-06-28 ENCOUNTER — Ambulatory Visit (INDEPENDENT_AMBULATORY_CARE_PROVIDER_SITE_OTHER): Payer: Medicare Other

## 2012-06-28 DIAGNOSIS — Z7901 Long term (current) use of anticoagulants: Secondary | ICD-10-CM

## 2012-06-28 LAB — POCT INR: INR: 2.8

## 2012-07-20 ENCOUNTER — Telehealth: Payer: Self-pay | Admitting: Family Medicine

## 2012-07-20 NOTE — Telephone Encounter (Signed)
Pt has refill request on chart for [facility], pt needs the script for 1 year, they will refill every 90 days if you do this. They have been called and have already got it noted on their system to be auto refill for one year. Questions of concerns please call the pt. Thank you

## 2012-07-20 NOTE — Telephone Encounter (Signed)
ok 

## 2012-07-21 ENCOUNTER — Other Ambulatory Visit: Payer: Self-pay | Admitting: *Deleted

## 2012-07-21 MED ORDER — WARFARIN SODIUM 5 MG PO TABS: ORAL_TABLET | ORAL | Status: DC

## 2012-07-22 NOTE — Telephone Encounter (Signed)
Coumadin 5mg  90 day supply with 1 year refills sent to mail order. Pt notified

## 2012-07-28 ENCOUNTER — Ambulatory Visit (INDEPENDENT_AMBULATORY_CARE_PROVIDER_SITE_OTHER): Payer: Medicare Other | Admitting: *Deleted

## 2012-07-28 DIAGNOSIS — Z7901 Long term (current) use of anticoagulants: Secondary | ICD-10-CM

## 2012-08-20 ENCOUNTER — Other Ambulatory Visit: Payer: Self-pay | Admitting: *Deleted

## 2012-08-20 DIAGNOSIS — Z7901 Long term (current) use of anticoagulants: Secondary | ICD-10-CM

## 2012-08-30 ENCOUNTER — Ambulatory Visit: Payer: Medicare Other

## 2012-08-30 LAB — PROTIME-INR
INR: 2.19 — ABNORMAL HIGH (ref <1.50)
Prothrombin Time: 23.6 seconds — ABNORMAL HIGH (ref 11.6–15.2)

## 2012-09-30 ENCOUNTER — Telehealth: Payer: Self-pay | Admitting: Family Medicine

## 2012-09-30 DIAGNOSIS — Z7901 Long term (current) use of anticoagulants: Secondary | ICD-10-CM

## 2012-09-30 NOTE — Telephone Encounter (Signed)
Patient needs BW paper to have INR done. Please call when complete.

## 2012-09-30 NOTE — Telephone Encounter (Signed)
Blood work papers printed and left up front for patient pick up. Patient notified. 

## 2012-10-04 LAB — PROTIME-INR
INR: 2.59 — ABNORMAL HIGH (ref <1.50)
Prothrombin Time: 26.7 seconds — ABNORMAL HIGH (ref 11.6–15.2)

## 2012-11-09 ENCOUNTER — Ambulatory Visit (INDEPENDENT_AMBULATORY_CARE_PROVIDER_SITE_OTHER): Payer: Medicare Other

## 2012-11-09 DIAGNOSIS — Z7901 Long term (current) use of anticoagulants: Secondary | ICD-10-CM

## 2012-11-09 LAB — POCT INR: INR: 3

## 2012-11-09 NOTE — Patient Instructions (Signed)
Continue same dose and recheck in 1 month

## 2012-12-13 ENCOUNTER — Ambulatory Visit: Payer: Medicare Other

## 2012-12-14 ENCOUNTER — Ambulatory Visit: Payer: Medicare Other

## 2012-12-15 ENCOUNTER — Ambulatory Visit (INDEPENDENT_AMBULATORY_CARE_PROVIDER_SITE_OTHER): Payer: Medicare Other

## 2012-12-15 ENCOUNTER — Telehealth: Payer: Self-pay

## 2012-12-15 DIAGNOSIS — Z7901 Long term (current) use of anticoagulants: Secondary | ICD-10-CM

## 2012-12-15 LAB — POCT INR: INR: 2.8

## 2012-12-15 NOTE — Telephone Encounter (Signed)
Patient needs bloodwork papers for his physical next month. He needs it coded as wellness so his insurance will pay 100% for it.

## 2012-12-15 NOTE — Patient Instructions (Signed)
Continue same treatment. Recheck in 1 month.

## 2012-12-29 ENCOUNTER — Telehealth: Payer: Self-pay | Admitting: Family Medicine

## 2012-12-29 DIAGNOSIS — Z79899 Other long term (current) drug therapy: Secondary | ICD-10-CM

## 2012-12-29 DIAGNOSIS — E782 Mixed hyperlipidemia: Secondary | ICD-10-CM

## 2012-12-29 DIAGNOSIS — Z125 Encounter for screening for malignant neoplasm of prostate: Secondary | ICD-10-CM

## 2012-12-30 NOTE — Telephone Encounter (Signed)
Pt states he needs his BW ordered for his visit on 10/14 this was requested already but has not been done.

## 2012-12-30 NOTE — Telephone Encounter (Signed)
Blood work ordered in [employer]. PSA ordered on old lab requisition due to not being able to put in the system. Left message on voicemail notifying patient to come pick up papers.

## 2012-12-30 NOTE — Telephone Encounter (Signed)
Lip liv mt7 psa

## 2013-01-03 ENCOUNTER — Encounter: Payer: Self-pay | Admitting: Family Medicine

## 2013-01-03 ENCOUNTER — Ambulatory Visit (INDEPENDENT_AMBULATORY_CARE_PROVIDER_SITE_OTHER): Payer: Medicare Other | Admitting: Family Medicine

## 2013-01-03 VITALS — BP 142/82 | Ht 67.5 in | Wt 171.0 lb

## 2013-01-03 DIAGNOSIS — N529 Male erectile dysfunction, unspecified: Secondary | ICD-10-CM

## 2013-01-03 DIAGNOSIS — Z7901 Long term (current) use of anticoagulants: Secondary | ICD-10-CM

## 2013-01-03 DIAGNOSIS — Z125 Encounter for screening for malignant neoplasm of prostate: Secondary | ICD-10-CM

## 2013-01-03 DIAGNOSIS — Z0000 Encounter for general adult medical examination without abnormal findings: Secondary | ICD-10-CM

## 2013-01-03 DIAGNOSIS — Z23 Encounter for immunization: Secondary | ICD-10-CM

## 2013-01-03 LAB — BASIC METABOLIC PANEL
CO2: 28 mEq/L (ref 19–32)
Calcium: 9.3 mg/dL (ref 8.4–10.5)
Chloride: 103 mEq/L (ref 96–112)
Creat: 0.91 mg/dL (ref 0.50–1.35)
Glucose, Bld: 96 mg/dL (ref 70–99)

## 2013-01-03 LAB — HEPATIC FUNCTION PANEL
AST: 14 U/L (ref 0–37)
Albumin: 4.4 g/dL (ref 3.5–5.2)
Alkaline Phosphatase: 67 U/L (ref 39–117)
Bilirubin, Direct: 0.1 mg/dL (ref 0.0–0.3)
Indirect Bilirubin: 0.3 mg/dL (ref 0.0–0.9)
Total Bilirubin: 0.4 mg/dL (ref 0.3–1.2)

## 2013-01-03 LAB — LIPID PANEL: LDL Cholesterol: 80 mg/dL (ref 0–99)

## 2013-01-03 NOTE — Progress Notes (Signed)
  Subjective:    Patient ID: [name], male    DOB: June 25, 1945, 67 y.o.   MRN: [id number]  HPI Patient is here today for annual physical.   Complains of hip pain. He is seeing an orthopedic doctor.   Pass cognitive test and no falls.   Exercise --not allowed to walk, etc. Stationary bicycle.ortho talking hip replacement--avasc necrosis per specialist.  Self grades diet as peretty fgood.  Tries not to miss coumadin dose.  Due colonoscopy 2015  Patient also notes significant difficulty with erections. We had talked about it several times before. He like to go ahead and start medication.  Results for orders placed in visit on 12/15/12  POCT INR      Result Value Range   INR 2.8       Review of Systems  Constitutional: Negative for fever, activity change and appetite change.  HENT: Negative for congestion and rhinorrhea.   Eyes: Negative for discharge.  Respiratory: Negative for cough and wheezing.   Cardiovascular: Negative for chest pain.  Gastrointestinal: Negative for vomiting, abdominal pain and blood in stool.  Genitourinary: Negative for frequency and difficulty urinating.  Musculoskeletal: Negative for neck pain.  Skin: Negative for rash.  Allergic/Immunologic: Negative for environmental allergies and food allergies.  Neurological: Negative for weakness and headaches.  Psychiatric/Behavioral: Negative for agitation.       Objective:   Physical Exam  Vitals reviewed. Constitutional: He appears well-developed and well-nourished.  HENT:  Head: Normocephalic and atraumatic.  Right Ear: External ear normal.  Left Ear: External ear normal.  Nose: Nose normal.  Mouth/Throat: Oropharynx is clear and moist.  Eyes: EOM are normal. Pupils are equal, round, and reactive to light.  Neck: Normal range of motion. Neck supple. No thyromegaly present.  Cardiovascular: Normal rate, regular rhythm and normal heart sounds.   No murmur heard. Pulmonary/Chest: Effort  normal and breath sounds normal. No respiratory distress. He has no wheezes.  Abdominal: Soft. Bowel sounds are normal. He exhibits no distension and no mass. There is no tenderness.  Genitourinary: Penis normal.  Musculoskeletal: Normal range of motion. He exhibits no edema.  Lymphadenopathy:    He has no cervical adenopathy.  Neurological: He is alert. He exhibits normal muscle tone.  Skin: Skin is warm and dry. No erythema.  Psychiatric: He has a normal mood and affect. His behavior is normal. Judgment normal.          Assessment & Plan:  Impression #1 wellness exam #2 chronic anticoagulation discussed #3 erectile dysfunction discussed. Plan trial of Viagra 100 mg one half tablet when necessary. Maintain same Coumadin dose. Diet exercise discussed. Flu shot and pneumonia shot. Recheck as scheduled.

## 2013-01-18 ENCOUNTER — Encounter: Payer: Self-pay | Admitting: Family Medicine

## 2013-01-18 ENCOUNTER — Ambulatory Visit (INDEPENDENT_AMBULATORY_CARE_PROVIDER_SITE_OTHER): Payer: BC Managed Care – PPO | Admitting: Family Medicine

## 2013-01-18 VITALS — BP 118/78 | Ht 67.5 in | Wt 171.9 lb

## 2013-01-18 DIAGNOSIS — R972 Elevated prostate specific antigen [PSA]: Secondary | ICD-10-CM | POA: Insufficient documentation

## 2013-01-18 DIAGNOSIS — Z7901 Long term (current) use of anticoagulants: Secondary | ICD-10-CM

## 2013-01-18 NOTE — Patient Instructions (Signed)
Take one tablet every day. Recheck in 2 weeks.

## 2013-01-18 NOTE — Progress Notes (Signed)
  Subjective:    Patient ID: [name], male    DOB: 1945-08-06, 67 y.o.   MRN: [id number]  HPIFollow up on bloodwork.   INR today 4.1. Repeated 3.9. Taking 5mg . One tablet every day except tues takes one and a half tablet  Patient arrives for discussion of blood work.  Review of Systems No chest pain no back pain no abdominal pain no change in urinary habits no nocturia no dysuria no abdominal or pelvic pain. ROS otherwise negative    Objective:   Physical Exam  Vital stable lungs clear heart regular in rhythm      Assessment & Plan:  Impression #1 elevated PSA this is the main reason for patient's discussion today. Generally patient has run into the low to mid twos historically up until this year. Now at 6.5. Exam unremarkable at last visit. #2 anticoagulation INR high plan Coumadin adjusted. Check INR in 2 weeks. Urology consult rationale discussed 25 minutes spent most in discussion. WSL

## 2013-01-25 ENCOUNTER — Telehealth: Payer: Self-pay | Admitting: Family Medicine

## 2013-01-25 DIAGNOSIS — R972 Elevated prostate specific antigen [PSA]: Secondary | ICD-10-CM

## 2013-01-25 NOTE — Telephone Encounter (Signed)
Patient called today to check on his referral that was discussed at last visit regarding prostate. I did not see an order for a referral in the system. But patient says that he wanted to let us know that he cannot make an appointment for the days of Nov. 19, 20, 21 due to wife being out of town.

## 2013-01-25 NOTE — Telephone Encounter (Signed)
Ok. [name], see 10 29 note. Needs visit with [facility] either wk before or wk after days that he has marked off. Reason is elevated psa

## 2013-01-25 NOTE — Telephone Encounter (Signed)
Referral initiated in the system. 

## 2013-01-25 NOTE — Telephone Encounter (Signed)
Nurses plz do ref to uro

## 2013-01-25 NOTE — Telephone Encounter (Signed)
No referral in my "Q", please initiate referral so I may process.

## 2013-02-01 ENCOUNTER — Telehealth: Payer: Self-pay | Admitting: Family Medicine

## 2013-02-01 ENCOUNTER — Ambulatory Visit (INDEPENDENT_AMBULATORY_CARE_PROVIDER_SITE_OTHER): Payer: BC Managed Care – PPO | Admitting: Family Medicine

## 2013-02-01 DIAGNOSIS — Z7901 Long term (current) use of anticoagulants: Secondary | ICD-10-CM

## 2013-02-01 LAB — POCT INR: INR: 3.2

## 2013-02-01 NOTE — Telephone Encounter (Signed)
Let's do 

## 2013-02-01 NOTE — Telephone Encounter (Signed)
Patient states he was seen 01/03/2013 for a Wellness Exam.  States he is getting a bill from [facility] for his blood work and Engineer, civil (consulting) with our office informed him to leave a message and they would look into why he was getting this bill.  Please call patient

## 2013-02-05 NOTE — Progress Notes (Signed)
  Subjective:    Patient ID: [name], male    DOB: [birth year], 67 y.o.   MRN: [id number]  HPI  Patient reports that this was only for a nurse's visit in the way. And not a actual visit. With the physician.  Review of Systems     Objective:   Physical Exam  Not performed.  Results for orders placed in visit on 02/01/13  POCT INR      Result Value Range   INR 3.2    '    Assessment & Plan:  Impression anticoagulation discussed with patient plan adjusted as per nurse's. WSL

## 2013-03-03 ENCOUNTER — Ambulatory Visit (INDEPENDENT_AMBULATORY_CARE_PROVIDER_SITE_OTHER): Payer: BC Managed Care – PPO | Admitting: *Deleted

## 2013-03-03 DIAGNOSIS — Z7901 Long term (current) use of anticoagulants: Secondary | ICD-10-CM

## 2013-03-03 NOTE — Patient Instructions (Signed)
SKIP COUMADIN DOSE TONIGHT (FRIDAY). Take 5 mg on Sat, 2.5mg  on Sun. Then take 5 mg on all other days. Repeat in 7-10 days.

## 2013-03-06 ENCOUNTER — Ambulatory Visit: Payer: BC Managed Care – PPO | Admitting: Family Medicine

## 2013-03-10 ENCOUNTER — Ambulatory Visit (INDEPENDENT_AMBULATORY_CARE_PROVIDER_SITE_OTHER): Payer: BC Managed Care – PPO

## 2013-03-10 DIAGNOSIS — Z7901 Long term (current) use of anticoagulants: Secondary | ICD-10-CM

## 2013-03-10 NOTE — Patient Instructions (Signed)
Take 1/2 tab Friday and Sunday, 1 tab all other days. Recheck in 3 weeks.

## 2013-03-14 ENCOUNTER — Encounter (INDEPENDENT_AMBULATORY_CARE_PROVIDER_SITE_OTHER): Payer: Self-pay

## 2013-03-14 ENCOUNTER — Ambulatory Visit (INDEPENDENT_AMBULATORY_CARE_PROVIDER_SITE_OTHER): Payer: BC Managed Care – PPO | Admitting: Urology

## 2013-03-14 DIAGNOSIS — N529 Male erectile dysfunction, unspecified: Secondary | ICD-10-CM

## 2013-03-14 DIAGNOSIS — R972 Elevated prostate specific antigen [PSA]: Secondary | ICD-10-CM

## 2013-03-31 ENCOUNTER — Ambulatory Visit (INDEPENDENT_AMBULATORY_CARE_PROVIDER_SITE_OTHER): Payer: BC Managed Care – PPO | Admitting: *Deleted

## 2013-03-31 DIAGNOSIS — Z7901 Long term (current) use of anticoagulants: Secondary | ICD-10-CM

## 2013-03-31 LAB — POCT INR: INR: 2.2

## 2013-03-31 NOTE — Patient Instructions (Signed)
Take 1/2 tab on Fridays and Sundays then 1 tab all other days. Recheck in 1 month with a regular office visit

## 2013-05-01 ENCOUNTER — Other Ambulatory Visit: Payer: Self-pay | Admitting: Family Medicine

## 2013-05-02 ENCOUNTER — Ambulatory Visit (INDEPENDENT_AMBULATORY_CARE_PROVIDER_SITE_OTHER): Payer: BC Managed Care – PPO | Admitting: *Deleted

## 2013-05-02 DIAGNOSIS — Z7901 Long term (current) use of anticoagulants: Secondary | ICD-10-CM

## 2013-05-02 LAB — POCT INR: INR: 1.6

## 2013-05-02 NOTE — Patient Instructions (Signed)
Take coumadin 5mg  every day and recheck INR in 3 weeks.

## 2013-05-23 ENCOUNTER — Ambulatory Visit: Payer: Medicare Other

## 2013-05-25 ENCOUNTER — Ambulatory Visit (INDEPENDENT_AMBULATORY_CARE_PROVIDER_SITE_OTHER): Payer: BC Managed Care – PPO | Admitting: *Deleted

## 2013-05-25 DIAGNOSIS — Z7901 Long term (current) use of anticoagulants: Secondary | ICD-10-CM

## 2013-05-25 LAB — POCT INR: INR: 2.9

## 2013-05-25 NOTE — Patient Instructions (Signed)
Take one tablet (5mg ) every day. Recheck in 4 weeks.

## 2013-06-28 ENCOUNTER — Ambulatory Visit (INDEPENDENT_AMBULATORY_CARE_PROVIDER_SITE_OTHER): Payer: BC Managed Care – PPO

## 2013-06-28 DIAGNOSIS — Z7901 Long term (current) use of anticoagulants: Secondary | ICD-10-CM

## 2013-06-28 LAB — POCT INR: INR: 2.7

## 2013-07-28 ENCOUNTER — Ambulatory Visit (INDEPENDENT_AMBULATORY_CARE_PROVIDER_SITE_OTHER): Payer: BC Managed Care – PPO

## 2013-07-28 DIAGNOSIS — Z7901 Long term (current) use of anticoagulants: Secondary | ICD-10-CM

## 2013-07-28 LAB — POCT INR: INR: 3.4

## 2013-08-11 ENCOUNTER — Ambulatory Visit (INDEPENDENT_AMBULATORY_CARE_PROVIDER_SITE_OTHER): Payer: BC Managed Care – PPO | Admitting: *Deleted

## 2013-08-11 DIAGNOSIS — Z7901 Long term (current) use of anticoagulants: Secondary | ICD-10-CM

## 2013-08-11 LAB — POCT INR: INR: 3

## 2013-08-11 NOTE — Patient Instructions (Signed)
Continue same treatment. Take 1 tab all days, except on Friday's, take 1/2 tab. Recheck in 4 weeks.

## 2013-08-29 ENCOUNTER — Other Ambulatory Visit: Payer: Self-pay | Admitting: *Deleted

## 2013-08-29 MED ORDER — WARFARIN SODIUM 5 MG PO TABS: ORAL_TABLET | ORAL | Status: DC

## 2013-09-08 ENCOUNTER — Ambulatory Visit: Payer: Medicare Other

## 2013-09-12 ENCOUNTER — Ambulatory Visit (INDEPENDENT_AMBULATORY_CARE_PROVIDER_SITE_OTHER): Payer: BC Managed Care – PPO | Admitting: *Deleted

## 2013-09-12 DIAGNOSIS — Z7901 Long term (current) use of anticoagulants: Secondary | ICD-10-CM

## 2013-09-12 LAB — POCT INR: INR: 2.1

## 2013-09-12 NOTE — Patient Instructions (Signed)
Take 1/2 tablet on Friday and 1 tablet all other days. Recheck in 4 weeks.

## 2013-10-13 ENCOUNTER — Ambulatory Visit (HOSPITAL_COMMUNITY): Payer: BC Managed Care – PPO

## 2013-10-13 ENCOUNTER — Other Ambulatory Visit (HOSPITAL_COMMUNITY): Payer: Self-pay | Admitting: Orthopedic Surgery

## 2013-10-13 DIAGNOSIS — R609 Edema, unspecified: Secondary | ICD-10-CM

## 2013-10-13 DIAGNOSIS — L539 Erythematous condition, unspecified: Secondary | ICD-10-CM

## 2013-10-13 DIAGNOSIS — M79605 Pain in left leg: Principal | ICD-10-CM

## 2013-10-13 DIAGNOSIS — M79604 Pain in right leg: Secondary | ICD-10-CM

## 2013-10-16 ENCOUNTER — Ambulatory Visit (HOSPITAL_COMMUNITY)
Admission: RE | Admit: 2013-10-16 | Discharge: 2013-10-16 | Disposition: A | Discharge status: Home / Self Care | Payer: BC Managed Care – PPO | Source: Ambulatory Visit | Attending: Orthopedic Surgery | Admitting: Orthopedic Surgery

## 2013-10-16 DIAGNOSIS — L539 Erythematous condition, unspecified: Secondary | ICD-10-CM | POA: Insufficient documentation

## 2013-10-16 DIAGNOSIS — M79604 Pain in right leg: Secondary | ICD-10-CM

## 2013-10-16 DIAGNOSIS — R609 Edema, unspecified: Secondary | ICD-10-CM

## 2013-10-16 DIAGNOSIS — M79609 Pain in unspecified limb: Secondary | ICD-10-CM | POA: Insufficient documentation

## 2013-10-16 DIAGNOSIS — M79605 Pain in left leg: Secondary | ICD-10-CM

## 2013-10-17 ENCOUNTER — Ambulatory Visit (INDEPENDENT_AMBULATORY_CARE_PROVIDER_SITE_OTHER): Payer: BC Managed Care – PPO | Admitting: *Deleted

## 2013-10-17 DIAGNOSIS — Z7901 Long term (current) use of anticoagulants: Secondary | ICD-10-CM

## 2013-10-17 LAB — POCT INR: INR: 2.8

## 2013-10-17 NOTE — Patient Instructions (Signed)
Take 1/2 tablet on Friday and 1 tablet all other days. Recheck in 4 weeks.  

## 2013-10-24 ENCOUNTER — Other Ambulatory Visit: Payer: Self-pay | Admitting: *Deleted

## 2013-10-24 DIAGNOSIS — Z7901 Long term (current) use of anticoagulants: Secondary | ICD-10-CM

## 2013-11-14 ENCOUNTER — Encounter: Payer: Self-pay | Admitting: Family Medicine

## 2013-11-14 ENCOUNTER — Ambulatory Visit (INDEPENDENT_AMBULATORY_CARE_PROVIDER_SITE_OTHER): Payer: BC Managed Care – PPO | Admitting: Family Medicine

## 2013-11-14 VITALS — BP 130/72 | Ht 67.5 in | Wt 181.0 lb

## 2013-11-14 DIAGNOSIS — Z7901 Long term (current) use of anticoagulants: Secondary | ICD-10-CM

## 2013-11-14 LAB — POCT INR: INR: 4.3

## 2013-11-14 NOTE — Progress Notes (Signed)
   Subjective:    Patient ID: [name], male    DOB: 01/20/1946, 68 y.o.   MRN: [id number]  HPI Patient arrives to get form filled out for surgical clearance for right hip replacement. Patient's INR 4.3 today .  Chronic right hip pain  Not exercing regularly due to the pain  Patient has substantial difficulty with his hip.  Long-term history a tendency towards DVTs and pulmonary emboli.  Now on lifelong anticoagulation.  Has had this therapy in the past.  Review of Systems Some fatigue no headache no chest pain no abdominal pain no change in bowel habits no blood in stool ROS otherwise negative    Objective:   Physical Exam  Alert vitals stable. Lungs clear. Heart rare in rhythm. HEENT normal. Ankles without edema. Positive pain with hip rotation.  I and R. elevated 4.3    Assessment & Plan:  Impression 1 chronic anticoagulation discussed. Coumadin adjusted. #2 need for bridge therapy. Patient definitely warrants a. Rationale discussed. Plan see patient instructions. Lovenox to cover.

## 2013-11-17 ENCOUNTER — Telehealth: Payer: Self-pay | Admitting: *Deleted

## 2013-11-17 NOTE — Patient Instructions (Signed)
Five days before the surgery stop the Coumadin.  On the same day, 5 days prior to surgery, start the twice per day Lovenox injections as directed.  The day before surgery be sure to have an INR to document normalization. Either we can do this or sometimes the specialists order this.  The evening before the surgery, take the last pre-surgery dose of Lovenox  Day of surgery take nothing  The day following surgery, resume twice per day Lovenox and resume coumadin daily at usual dose  Three or four days after surgery, come to our office for an INR for further recommendations

## 2013-11-17 NOTE — Telephone Encounter (Signed)
Pt notified lovenox rx ready to be picked up. lovenox #20 80mg  sub q q12 hours. Pt notified he can send to mail order if he wishes. Patient instructions on coumadin and lovenox discussed with patient and copy of instructions printed for pt to pick up with script.   Five days before the surgery stop the Coumadin.  On the same day, 5 days prior to surgery, start the twice per day Lovenox injections as directed.  The day before surgery be sure to have an INR to document normalization. Either we can do this or sometimes the specialists order this.  The evening before the surgery, take the last pre-surgery dose of Lovenox  Day of surgery take nothing  The day following surgery, resume twice per day Lovenox and resume coumadin daily at usual dose  Three or four days after surgery, come to our office for an INR for further recommendations.

## 2013-11-28 ENCOUNTER — Encounter (HOSPITAL_COMMUNITY): Payer: Self-pay | Admitting: Pharmacy Technician

## 2013-11-28 ENCOUNTER — Ambulatory Visit (INDEPENDENT_AMBULATORY_CARE_PROVIDER_SITE_OTHER): Payer: BC Managed Care – PPO

## 2013-11-28 DIAGNOSIS — Z7901 Long term (current) use of anticoagulants: Secondary | ICD-10-CM

## 2013-11-28 LAB — POCT INR: INR: 2.9

## 2013-12-03 NOTE — H&P (Signed)
TOTAL HIP ADMISSION H&P  Patient is admitted for right total hip arthroplasty, anterior approach.  Subjective:  Chief Complaint:    Right hip OA / pain  HPI: [name], 68 y.o. male, has a history of pain and functional disability in the right hip(s) due to arthritis and patient has failed non-surgical conservative treatments for greater than 12 weeks to include NSAID's and/or analgesics and activity modification.  Onset of symptoms was gradual starting 1+ years ago with gradually worsening course since that time.The patient noted no past surgery on the right hip(s).  Patient currently rates pain in the right hip at 10 out of 10 with activity. Patient has worsening of pain with activity and weight bearing, trendelenberg gait, pain that interfers with activities of daily living and pain with passive range of motion. Patient has evidence of periarticular osteophytes and joint space narrowing by imaging studies. This condition presents safety issues increasing the risk of falls. There is no current active infection.  Risks, benefits and expectations were discussed with the patient.  Risks including but not limited to the risk of anesthesia, blood clots, nerve damage, blood vessel damage, failure of the prosthesis, infection and up to and including death.  Patient understand the risks, benefits and expectations and wishes to proceed with surgery.   PCP: Harlow Asa, MD  D/C Plans:      Home with HHPT  Post-op Meds:       No Rx given  Tranexamic Acid:      To be given - topically (previous DVT / PE)  Decadron:      Is to be given  FYI:     Lovenox 80 mg bid post-op with Coumadin  Norco post-op   Patient Active Problem List   Diagnosis Date Noted  . Elevated PSA 01/18/2013  . Erectile dysfunction 01/03/2013  . Long term (current) use of anticoagulants 06/25/2012   Past Medical History  Diagnosis Date  . Back pain   . DVT (deep venous thrombosis)   . Pulmonary embolism   .  Hyperlipidemia   . Anticoagulated on Coumadin   . Vertigo     Past Surgical History  Procedure Laterality Date  . Cervical fusion      No prescriptions prior to admission   No Known Allergies   History  Substance Use Topics  . Smoking status: Never Smoker   . Smokeless tobacco: Not on file  . Alcohol Use: Not on file    Family History  Problem Relation Age of Onset  . Breast cancer Mother   . Hypertension Mother   . Hyperlipidemia Mother   . Heart attack Mother   . Hypertension Other   . Diabetes Other      Review of Systems  Constitutional: Negative.   HENT: Negative.   Eyes: Negative.   Respiratory: Negative.   Cardiovascular: Negative.   Gastrointestinal: Negative.   Genitourinary: Negative.   Musculoskeletal: Positive for back pain and joint pain.  Skin: Negative.   Neurological: Negative.   Endo/Heme/Allergies: Negative.   Psychiatric/Behavioral: Negative.     Objective:  Physical Exam  Constitutional: He is oriented to person, place, and time. He appears well-developed and well-nourished.  HENT:  Head: Normocephalic and atraumatic.  Mouth/Throat: Oropharynx is clear and moist.  Eyes: Pupils are equal, round, and reactive to light.  Neck: Neck supple. No JVD present. No tracheal deviation present. No thyromegaly present.  Cardiovascular: Normal rate, regular rhythm, normal heart sounds and intact distal pulses.  Respiratory: Effort normal and breath sounds normal. No stridor. No respiratory distress. He has no wheezes.  GI: Soft. There is no tenderness. There is no guarding.  Musculoskeletal:       Right hip: He exhibits decreased range of motion, decreased strength, tenderness and bony tenderness. He exhibits no swelling, no deformity and no laceration.  Lymphadenopathy:    He has no cervical adenopathy.  Neurological: He is alert and oriented to person, place, and time.  Skin: Skin is warm and dry.  Psychiatric: He has a normal mood and affect.      Labs:  Estimated body mass index is 26.51 kg/(m^2) as calculated from the following:   Height as of 01/18/13: 5' 7.5" (1.715 m).   Weight as of 01/18/13: 77.973 kg (171 lb 14.4 oz).   Imaging Review Plain radiographs demonstrate severe degenerative joint disease of the right hip(s). The bone quality appears to be good for age and reported activity level.  Assessment/Plan:  End stage arthritis, right hip(s)  The patient history, physical examination, clinical judgement of the provider and imaging studies are consistent with end stage degenerative joint disease of the right hip(s) and total hip arthroplasty is deemed medically necessary. The treatment options including medical management, injection therapy, arthroscopy and arthroplasty were discussed at length. The risks and benefits of total hip arthroplasty were presented and reviewed. The risks due to aseptic loosening, infection, stiffness, dislocation/subluxation,  thromboembolic complications and other imponderables were discussed.  The patient acknowledged the explanation, agreed to proceed with the plan and consent was signed. Patient is being admitted for inpatient treatment for surgery, pain control, PT, OT, prophylactic antibiotics, VTE prophylaxis, progressive ambulation and ADL's and discharge planning.The patient is planning to be discharged home with home health services.    Anastasio Auerbach Demarius Archila   PA-C  12/03/2013, 12:41 PM

## 2013-12-04 ENCOUNTER — Telehealth: Payer: Self-pay | Admitting: Family Medicine

## 2013-12-04 NOTE — Telephone Encounter (Signed)
Patient needs surgery clearance form signed and refaxed to [facility], because they said they did not receive anything. The original was attached to chart at visit, nurse told patient it was faxed, but unable to locate original or patients chart.

## 2013-12-04 NOTE — Telephone Encounter (Signed)
From was faxed this am- patient was notified.

## 2013-12-14 ENCOUNTER — Inpatient Hospital Stay (HOSPITAL_COMMUNITY): Admission: RE | Admit: 2013-12-14 | Payer: BC Managed Care – PPO | Source: Ambulatory Visit

## 2013-12-14 NOTE — Patient Instructions (Addendum)
[name]  12/15/2013                           YOUR PROCEDURE IS SCHEDULED ON: 12/19/13               ENTER THRU [facility] MAIN [facility] ENTRANCE AND                           FOLLOW  SIGNS TO SHORT STAY CENTER                 ARRIVE AT SHORT STAY AT: 5:15 am               CALL THIS NUMBER IF ANY PROBLEMS THE DAY OF SURGERY :               [phone]                                REMEMBER:   Do not eat food or drink liquids AFTER MIDNIGHT                  Take these medicines the morning of surgery with               A SIPS OF WATER :     MAY TAKE VICODIN OR ROBAXIN IF NEEDED    Do not wear jewelry, make-up   Do not wear lotions, powders, or perfumes.   Do not shave legs or underarms 12 hrs. before surgery (men may shave face)  Do not bring valuables to the hospital.  Contacts, dentures or bridgework may not be worn into surgery.  Leave suitcase in the car. After surgery it may be brought to your room.  For patients admitted to the hospital more than one night, checkout time is            11:00 AM                                                         ________________________________________________________________________                                                                                                  [facility] - PREPARING FOR SURGERY  Before surgery, you can play an important role.  Because skin is not sterile, your skin needs to be as free of germs as possible.  You can reduce the number of germs on your skin by washing with CHG (chlorahexidine gluconate) soap before surgery.  CHG is an antiseptic cleaner which kills germs and bonds with the skin to continue killing germs even after washing. Please DO NOT use if you have an allergy to CHG or antibacterial soaps.  If your skin becomes reddened/irritated stop using the CHG and inform  your nurse when you arrive at Short Stay. Do not shave (including legs and underarms) for at least 48  hours prior to the first CHG shower.  You may shave your face. Please follow these instructions carefully:   1.  Shower with CHG Soap the night before surgery and the  morning of Surgery.   2.  If you choose to wash your hair, wash your hair first as usual with your  normal  Shampoo.   3.  After you shampoo, rinse your hair and body thoroughly to remove the  shampoo.                                         4.  Use CHG as you would any other liquid soap.  You can apply chg directly  to the skin and wash . Gently wash with scrungie or clean wascloth    5.  Apply the CHG Soap to your body ONLY FROM THE NECK DOWN.   Do not use on open                           Wound or open sores. Avoid contact with eyes, ears mouth and genitals (private parts).                        Genitals (private parts) with your normal soap.              6.  Wash thoroughly, paying special attention to the area where your surgery  will be performed.   7.  Thoroughly rinse your body with warm water from the neck down.   8.  DO NOT shower/wash with your normal soap after using and rinsing off  the CHG Soap .                9.  Pat yourself dry with a clean towel.             10.  Wear clean pajamas.             11.  Place clean sheets on your bed the night of your first shower and do not  sleep with pets.  Day of Surgery : Do not apply any lotions/deodorants the morning of surgery.  Please wear clean clothes to the hospital/surgery center.  FAILURE TO FOLLOW THESE INSTRUCTIONS MAY RESULT IN THE CANCELLATION OF YOUR SURGERY    PATIENT SIGNATURE_________________________________  ______________________________________________________________________     [name]  An incentive spirometer is a tool that can help keep your lungs clear and active. This tool measures how well you are filling your lungs with each breath. Taking long deep breaths may help reverse or decrease the chance of developing  breathing (pulmonary) problems (especially infection) following:  A long period of time when you are unable to move or be active. BEFORE THE PROCEDURE   If the spirometer includes an indicator to show your best effort, your nurse or respiratory therapist will set it to a desired goal.  If possible, sit up straight or lean slightly forward. Try not to slouch.  Hold the incentive spirometer in an upright position. INSTRUCTIONS FOR USE  1. Sit on the edge of your bed if possible, or sit up as far as you can in bed or on a chair. 2. Hold the incentive spirometer  in an upright position. 3. Breathe out normally. 4. Place the mouthpiece in your mouth and seal your lips tightly around it. 5. Breathe in slowly and as deeply as possible, raising the piston or the ball toward the top of the column. 6. Hold your breath for 3-5 seconds or for as long as possible. Allow the piston or ball to fall to the bottom of the column. 7. Remove the mouthpiece from your mouth and breathe out normally. 8. Rest for a few seconds and repeat Steps 1 through 7 at least 10 times every 1-2 hours when you are awake. Take your time and take a few normal breaths between deep breaths. 9. The spirometer may include an indicator to show your best effort. Use the indicator as a goal to work toward during each repetition. 10. After each set of 10 deep breaths, practice coughing to be sure your lungs are clear. If you have an incision (the cut made at the time of surgery), support your incision when coughing by placing a pillow or rolled up towels firmly against it. Once you are able to get out of bed, walk around indoors and cough well. You may stop using the incentive spirometer when instructed by your caregiver.  RISKS AND COMPLICATIONS  Take your time so you do not get dizzy or light-headed.  If you are in pain, you may need to take or ask for pain medication before doing incentive spirometry. It is harder to take a deep  breath if you are having pain. AFTER USE  Rest and breathe slowly and easily.  It can be helpful to keep track of a log of your progress. Your caregiver can provide you with a simple table to help with this. If you are using the spirometer at home, follow these instructions: SEEK MEDICAL CARE IF:   You are having difficultly using the spirometer.  You have trouble using the spirometer as often as instructed.  Your pain medication is not giving enough relief while using the spirometer.  You develop fever of 100.5 F (38.1 C) or higher. SEEK IMMEDIATE MEDICAL CARE IF:   You cough up bloody sputum that had not been present before.  You develop fever of 102 F (38.9 C) or greater.  You develop worsening pain at or near the incision site. MAKE SURE YOU:   Understand these instructions.  Will watch your condition.  Will get help right away if you are not doing well or get worse. Document Released: 07/20/2006 Document Revised: 06/01/2011 Document Reviewed: 09/20/2006 Exit[employer] Patient Information 2014 [employer], [place].   ________________________________________________________________________  WHAT IS A BLOOD TRANSFUSION? Blood Transfusion Information  A transfusion is the replacement of blood or some of its parts. Blood is made up of multiple cells which provide different functions.  Red blood cells carry oxygen and are used for blood loss replacement.  White blood cells fight against infection.  Platelets control bleeding.  Plasma helps clot blood.  Other blood products are available for specialized needs, such as hemophilia or other clotting disorders. BEFORE THE TRANSFUSION  Who gives blood for transfusions?   Healthy volunteers who are fully evaluated to make sure their blood is safe. This is blood bank blood. Transfusion therapy is the safest it has ever been in the practice of medicine. Before blood is taken from a donor, a complete history is taken to make sure  that person has no history of diseases nor engages in risky social behavior (examples are intravenous drug use or sexual  activity with multiple partners). The donor's travel history is screened to minimize risk of transmitting infections, such as malaria. The donated blood is tested for signs of infectious diseases, such as HIV and hepatitis. The blood is then tested to be sure it is compatible with you in order to minimize the chance of a transfusion reaction. If you or a relative donates blood, this is often done in anticipation of surgery and is not appropriate for emergency situations. It takes many days to process the donated blood. RISKS AND COMPLICATIONS Although transfusion therapy is very safe and saves many lives, the main dangers of transfusion include:   Getting an infectious disease.  Developing a transfusion reaction. This is an allergic reaction to something in the blood you were given. Every precaution is taken to prevent this. The decision to have a blood transfusion has been considered carefully by your caregiver before blood is given. Blood is not given unless the benefits outweigh the risks. AFTER THE TRANSFUSION  Right after receiving a blood transfusion, you will usually feel much better and more energetic. This is especially true if your red blood cells have gotten low (anemic). The transfusion raises the level of the red blood cells which carry oxygen, and this usually causes an energy increase.  The nurse administering the transfusion will monitor you carefully for complications. HOME CARE INSTRUCTIONS  No special instructions are needed after a transfusion. You may find your energy is better. Speak with your caregiver about any limitations on activity for underlying diseases you may have. SEEK MEDICAL CARE IF:   Your condition is not improving after your transfusion.  You develop redness or irritation at the intravenous (IV) site. SEEK IMMEDIATE MEDICAL CARE IF:  Any of  the following symptoms occur over the next 12 hours:  Shaking chills.  You have a temperature by mouth above 102 F (38.9 C), not controlled by medicine.  Chest, back, or muscle pain.  People around you feel you are not acting correctly or are confused.  Shortness of breath or difficulty breathing.  Dizziness and fainting.  You get a rash or develop hives.  You have a decrease in urine output.  Your urine turns a dark color or changes to pink, red, or brown. Any of the following symptoms occur over the next 10 days:  You have a temperature by mouth above 102 F (38.9 C), not controlled by medicine.  Shortness of breath.  Weakness after normal activity.  The white part of the eye turns yellow (jaundice).  You have a decrease in the amount of urine or are urinating less often.  Your urine turns a dark color or changes to pink, red, or brown. Document Released: 03/06/2000 Document Revised: 06/01/2011 Document Reviewed: 10/24/2007 [facility] Patient Information 2014 [place], [place].  _______________________________________________________________________

## 2013-12-15 ENCOUNTER — Encounter (HOSPITAL_COMMUNITY)
Admission: RE | Admit: 2013-12-15 | Discharge: 2013-12-15 | Disposition: A | Discharge status: Home / Self Care | Payer: BC Managed Care – PPO | Source: Ambulatory Visit | Attending: Orthopedic Surgery | Admitting: Orthopedic Surgery

## 2013-12-15 ENCOUNTER — Encounter (INDEPENDENT_AMBULATORY_CARE_PROVIDER_SITE_OTHER): Payer: Self-pay

## 2013-12-15 ENCOUNTER — Ambulatory Visit (HOSPITAL_COMMUNITY)
Admission: RE | Admit: 2013-12-15 | Discharge: 2013-12-15 | Disposition: A | Discharge status: Home / Self Care | Payer: BC Managed Care – PPO | Source: Ambulatory Visit | Attending: Anesthesiology | Admitting: Anesthesiology

## 2013-12-15 ENCOUNTER — Encounter (HOSPITAL_COMMUNITY): Payer: Self-pay

## 2013-12-15 DIAGNOSIS — Z01818 Encounter for other preprocedural examination: Secondary | ICD-10-CM | POA: Diagnosis present

## 2013-12-15 DIAGNOSIS — Z86718 Personal history of other venous thrombosis and embolism: Secondary | ICD-10-CM | POA: Insufficient documentation

## 2013-12-15 HISTORY — DX: Personal history of urinary calculi: Z87.442

## 2013-12-15 HISTORY — DX: Family history of other specified conditions: Z84.89

## 2013-12-15 LAB — URINALYSIS, ROUTINE W REFLEX MICROSCOPIC
Bilirubin Urine: NEGATIVE
Glucose, UA: NEGATIVE mg/dL
Hgb urine dipstick: NEGATIVE
Ketones, ur: NEGATIVE mg/dL
LEUKOCYTES UA: NEGATIVE
NITRITE: NEGATIVE
PROTEIN: NEGATIVE mg/dL
SPECIFIC GRAVITY, URINE: 1.019 (ref 1.005–1.030)
UROBILINOGEN UA: 0.2 mg/dL (ref 0.0–1.0)
pH: 5.5 (ref 5.0–8.0)

## 2013-12-15 LAB — CBC
HEMATOCRIT: 38.8 % — AB (ref 39.0–52.0)
Hemoglobin: 13 g/dL (ref 13.0–17.0)
MCH: 28.6 pg (ref 26.0–34.0)
MCHC: 33.5 g/dL (ref 30.0–36.0)
MCV: 85.3 fL (ref 78.0–100.0)
Platelets: 227 10*3/uL (ref 150–400)
RBC: 4.55 MIL/uL (ref 4.22–5.81)
RDW: 13 % (ref 11.5–15.5)
WBC: 4.9 10*3/uL (ref 4.0–10.5)

## 2013-12-15 LAB — BASIC METABOLIC PANEL
Anion gap: 11 (ref 5–15)
BUN: 12 mg/dL (ref 6–23)
CO2: 27 meq/L (ref 19–32)
Calcium: 9.1 mg/dL (ref 8.4–10.5)
Chloride: 98 mEq/L (ref 96–112)
Creatinine, Ser: 0.99 mg/dL (ref 0.50–1.35)
GFR calc Af Amer: >90 mL/min (ref >90)
GFR, EST NON AFRICAN AMERICAN: 83 mL/min — AB (ref >90)
GLUCOSE: 93 mg/dL (ref 70–99)
POTASSIUM: 4 meq/L (ref 3.7–5.3)
Sodium: 136 mEq/L — ABNORMAL LOW (ref 137–147)

## 2013-12-15 LAB — SURGICAL PCR SCREEN
MRSA, PCR: NEGATIVE
STAPHYLOCOCCUS AUREUS: NEGATIVE

## 2013-12-15 LAB — PROTIME-INR
INR: 2.08 — ABNORMAL HIGH (ref 0.00–1.49)
Prothrombin Time: 23.4 seconds — ABNORMAL HIGH (ref 11.6–15.2)

## 2013-12-15 LAB — APTT: aPTT: 56 seconds — ABNORMAL HIGH (ref 24–37)

## 2013-12-15 LAB — ABO/RH: ABO/RH(D): O POS

## 2013-12-15 NOTE — Progress Notes (Signed)
Spoke with Dr. Leta Jungling concerning pt's family hx of Malignant hypothermia - also documented on OR schedule

## 2013-12-18 ENCOUNTER — Ambulatory Visit (INDEPENDENT_AMBULATORY_CARE_PROVIDER_SITE_OTHER): Payer: BC Managed Care – PPO

## 2013-12-18 DIAGNOSIS — Z7901 Long term (current) use of anticoagulants: Secondary | ICD-10-CM

## 2013-12-18 LAB — POCT INR: INR: 1.2

## 2013-12-18 NOTE — Anesthesia Preprocedure Evaluation (Addendum)
Anesthesia Evaluation  Patient identified by MRN, date of birth, ID band Patient awake    Reviewed: Allergy & Precautions, H&P , NPO status , Patient's Chart, lab work & pertinent test results  History of Anesthesia Complications (+) Family history of anesthesia reaction and history of anesthetic complications (Family history of malignant hyperthermia)  Airway Mallampati: II TM Distance: >3 FB Neck ROM: Full    Dental no notable dental hx.    Pulmonary PE Hx of PE and DVT in left leg, chronic anticoagulation. Last dose of lovenox was 10/1 at 8am, last dose of warfarin was 9/23 and INR on 9/28 was 1.2 breath sounds clear to auscultation  Pulmonary exam normal       Cardiovascular negative cardio ROS  Rhythm:Regular Rate:Normal     Neuro/Psych negative neurological ROS  negative psych ROS   GI/Hepatic negative GI ROS, Neg liver ROS,   Endo/Other  negative endocrine ROS  Renal/GU negative Renal ROS     Musculoskeletal negative musculoskeletal ROS (+)   Abdominal   Peds  Hematology negative hematology ROS (+)   Anesthesia Other Findings   Reproductive/Obstetrics                         Anesthesia Physical Anesthesia Plan  ASA: II  Anesthesia Plan: Spinal   Post-op Pain Management:    Induction: Intravenous  Airway Management Planned: Nasal Cannula  Additional Equipment:   Intra-op Plan:   Post-operative Plan:   Informed Consent: I have reviewed the patients History and Physical, chart, labs and discussed the procedure including the risks, benefits and alternatives for the proposed anesthesia with the patient or authorized representative who has indicated his/her understanding and acceptance.   Dental advisory given  Plan Discussed with: CRNA  Anesthesia Plan Comments: (Plan for spinal anesthesia per patient preference and to avoid general anesthesia. If general anesthesia  required, will avoid succinylcholine and volatile anesthetics as patient has a family history of malignant hyperthermia. Will place filters on ventilator, removed soda lime)       Anesthesia Quick Evaluation

## 2013-12-19 ENCOUNTER — Other Ambulatory Visit: Payer: Self-pay | Admitting: *Deleted

## 2013-12-19 ENCOUNTER — Encounter (HOSPITAL_COMMUNITY): Payer: Self-pay | Admitting: *Deleted

## 2013-12-19 ENCOUNTER — Ambulatory Visit (HOSPITAL_COMMUNITY)
Admission: RE | Admit: 2013-12-19 | Discharge: 2013-12-19 | Disposition: A | Discharge status: Home / Self Care | Payer: BC Managed Care – PPO | Source: Ambulatory Visit | Attending: Orthopedic Surgery | Admitting: Orthopedic Surgery

## 2013-12-19 ENCOUNTER — Encounter (HOSPITAL_COMMUNITY)
Admission: RE | Disposition: A | Discharge status: Home / Self Care | Payer: Self-pay | Source: Ambulatory Visit | Attending: Orthopedic Surgery

## 2013-12-19 ENCOUNTER — Telehealth: Payer: Self-pay | Admitting: Family Medicine

## 2013-12-19 DIAGNOSIS — M169 Osteoarthritis of hip, unspecified: Secondary | ICD-10-CM | POA: Diagnosis present

## 2013-12-19 DIAGNOSIS — M1610 Unilateral primary osteoarthritis, unspecified hip: Secondary | ICD-10-CM | POA: Diagnosis not present

## 2013-12-19 DIAGNOSIS — Z538 Procedure and treatment not carried out for other reasons: Secondary | ICD-10-CM | POA: Insufficient documentation

## 2013-12-19 DIAGNOSIS — Z86718 Personal history of other venous thrombosis and embolism: Secondary | ICD-10-CM | POA: Insufficient documentation

## 2013-12-19 DIAGNOSIS — M8700 Idiopathic aseptic necrosis of unspecified bone: Secondary | ICD-10-CM | POA: Insufficient documentation

## 2013-12-19 DIAGNOSIS — E785 Hyperlipidemia, unspecified: Secondary | ICD-10-CM | POA: Insufficient documentation

## 2013-12-19 LAB — TYPE AND SCREEN
ABO/RH(D): O POS
ANTIBODY SCREEN: NEGATIVE

## 2013-12-19 SURGERY — ARTHROPLASTY, HIP, TOTAL, ANTERIOR APPROACH
Anesthesia: General

## 2013-12-19 MED ORDER — PROPOFOL 10 MG/ML IV BOLUS
INTRAVENOUS | Status: AC
  Filled 2013-12-19: qty 20

## 2013-12-19 MED ORDER — CEFAZOLIN SODIUM-DEXTROSE 2-3 GM-% IV SOLR
INTRAVENOUS | Status: AC
  Filled 2013-12-19: qty 50

## 2013-12-19 MED ORDER — MIDAZOLAM HCL 2 MG/2ML IJ SOLN
INTRAMUSCULAR | Status: AC
  Filled 2013-12-19: qty 2

## 2013-12-19 MED ORDER — ENOXAPARIN SODIUM 80 MG/0.8ML ~~LOC~~ SOLN: 80.0000 mg | Freq: Two times a day (BID) | SUBCUTANEOUS | Status: DC

## 2013-12-19 MED ORDER — DEXAMETHASONE SODIUM PHOSPHATE 10 MG/ML IJ SOLN: 10.0000 mg | Freq: Once | INTRAMUSCULAR | Status: DC

## 2013-12-19 MED ORDER — CEFAZOLIN SODIUM-DEXTROSE 2-3 GM-% IV SOLR
2.0000 g | INTRAVENOUS | Status: DC
Start: 2013-12-19 — End: 2013-12-21

## 2013-12-19 MED ORDER — ONDANSETRON HCL 4 MG/2ML IJ SOLN
INTRAMUSCULAR | Status: AC
  Filled 2013-12-19: qty 2

## 2013-12-19 MED ORDER — TRANEXAMIC ACID 100 MG/ML IV SOLN
2000.0000 mg | Freq: Once | INTRAVENOUS | Status: DC
  Filled 2013-12-19: qty 20

## 2013-12-19 MED ORDER — FENTANYL CITRATE 0.05 MG/ML IJ SOLN
INTRAMUSCULAR | Status: AC
  Filled 2013-12-19: qty 2

## 2013-12-19 MED ORDER — CHLORHEXIDINE GLUCONATE 4 % EX LIQD
60.0000 mL | Freq: Once | CUTANEOUS | Status: DC
Start: 2013-12-19 — End: 2013-12-21

## 2013-12-19 NOTE — Telephone Encounter (Signed)
Dr. Brett Canales spoke with the patient.

## 2013-12-19 NOTE — Telephone Encounter (Signed)
Patient said that he was due to have surgery today, but it got cancelled due to his lovenox. He is requesting for Dr. Brett Canales to call him.

## 2013-12-19 NOTE — Progress Notes (Signed)
Surgery cancelled related to high dose lovenox taken by patient 12/18/13 between 1800-1900. Dr. Charlann Boxer updated and to try to reschedule for this Friday. Patient transported back to short stay to discharge home.  Lyda Kalata CRNA

## 2013-12-19 NOTE — Telephone Encounter (Signed)
Left message on voicemail to return call.

## 2013-12-19 NOTE — Telephone Encounter (Signed)
Nurse call pt

## 2013-12-19 NOTE — Interval H&P Note (Signed)
History and Physical Interval Note:  12/19/2013 6:37 AM  [name]  has presented today for surgery, with the diagnosis of RIGHT HIP AVN  The various methods of treatment have been discussed with the patient and family. After consideration of risks, benefits and other options for treatment, the patient has consented to  Procedure(s): RIGHT TOTAL HIP ARTHROPLASTY ANTERIOR APPROACH (Right) as a surgical intervention .  The patient's history has been reviewed, patient examined, no change in status, stable for surgery.  I have reviewed the patient's chart and labs.  Questions were answered to the patient's satisfaction.     Shelda Pal

## 2013-12-22 ENCOUNTER — Encounter (HOSPITAL_COMMUNITY): Payer: BC Managed Care – PPO | Admitting: Certified Registered Nurse Anesthetist

## 2013-12-22 ENCOUNTER — Inpatient Hospital Stay (HOSPITAL_COMMUNITY): Payer: BC Managed Care – PPO | Admitting: Certified Registered Nurse Anesthetist

## 2013-12-22 ENCOUNTER — Inpatient Hospital Stay (HOSPITAL_COMMUNITY)
Admission: RE | Admit: 2013-12-22 | Discharge: 2013-12-23 | DRG: 470 | Disposition: A | Discharge status: Home Health | Payer: BC Managed Care – PPO | Source: Ambulatory Visit | Attending: Orthopedic Surgery | Admitting: Orthopedic Surgery

## 2013-12-22 ENCOUNTER — Inpatient Hospital Stay (HOSPITAL_COMMUNITY): Payer: BC Managed Care – PPO

## 2013-12-22 ENCOUNTER — Encounter (HOSPITAL_COMMUNITY): Payer: Self-pay | Admitting: *Deleted

## 2013-12-22 ENCOUNTER — Encounter (HOSPITAL_COMMUNITY)
Admission: RE | Disposition: A | Discharge status: Home Health | Payer: Self-pay | Source: Ambulatory Visit | Attending: Orthopedic Surgery

## 2013-12-22 DIAGNOSIS — E785 Hyperlipidemia, unspecified: Secondary | ICD-10-CM | POA: Diagnosis present

## 2013-12-22 DIAGNOSIS — Z86711 Personal history of pulmonary embolism: Secondary | ICD-10-CM | POA: Diagnosis not present

## 2013-12-22 DIAGNOSIS — M1611 Unilateral primary osteoarthritis, right hip: Principal | ICD-10-CM | POA: Diagnosis present

## 2013-12-22 DIAGNOSIS — M25551 Pain in right hip: Secondary | ICD-10-CM | POA: Diagnosis present

## 2013-12-22 DIAGNOSIS — Z86718 Personal history of other venous thrombosis and embolism: Secondary | ICD-10-CM | POA: Diagnosis not present

## 2013-12-22 DIAGNOSIS — Z96643 Presence of artificial hip joint, bilateral: Secondary | ICD-10-CM

## 2013-12-22 DIAGNOSIS — Z96641 Presence of right artificial hip joint: Secondary | ICD-10-CM

## 2013-12-22 HISTORY — PX: TOTAL HIP ARTHROPLASTY: SHX124

## 2013-12-22 LAB — CBC
HCT: 34.2 % — ABNORMAL LOW (ref 39.0–52.0)
Hemoglobin: 11.4 g/dL — ABNORMAL LOW (ref 13.0–17.0)
MCH: 28.4 pg (ref 26.0–34.0)
MCHC: 33.3 g/dL (ref 30.0–36.0)
MCV: 85.1 fL (ref 78.0–100.0)
Platelets: 251 10*3/uL (ref 150–400)
RBC: 4.02 MIL/uL — AB (ref 4.22–5.81)
RDW: 12.7 % (ref 11.5–15.5)
WBC: 8.4 10*3/uL (ref 4.0–10.5)

## 2013-12-22 LAB — CREATININE, SERUM
Creatinine, Ser: 0.93 mg/dL (ref 0.50–1.35)
GFR, EST NON AFRICAN AMERICAN: 85 mL/min — AB (ref >90)

## 2013-12-22 SURGERY — ARTHROPLASTY, HIP, TOTAL, ANTERIOR APPROACH
Anesthesia: Spinal | Site: Hip | Laterality: Right

## 2013-12-22 MED ORDER — METOCLOPRAMIDE HCL 10 MG PO TABS: 5.0000 mg | ORAL_TABLET | Freq: Three times a day (TID) | ORAL | Status: DC | PRN

## 2013-12-22 MED ORDER — PHENYLEPHRINE HCL 10 MG/ML IJ SOLN
10.0000 mg | INTRAVENOUS | Status: DC | PRN
  Administered 2013-12-22: 25 ug/min via INTRAVENOUS

## 2013-12-22 MED ORDER — DSS 100 MG PO CAPS: 100.0000 mg | ORAL_CAPSULE | Freq: Two times a day (BID) | ORAL | Status: DC

## 2013-12-22 MED ORDER — POLYETHYLENE GLYCOL 3350 17 G PO PACK
17.0000 g | PACK | Freq: Two times a day (BID) | ORAL | Status: DC
  Administered 2013-12-23: 17 g via ORAL

## 2013-12-22 MED ORDER — LIDOCAINE HCL (CARDIAC) 20 MG/ML IV SOLN
INTRAVENOUS | Status: AC
  Filled 2013-12-22: qty 5

## 2013-12-22 MED ORDER — ALUM & MAG HYDROXIDE-SIMETH 200-200-20 MG/5ML PO SUSP: 30.0000 mL | ORAL | Status: DC | PRN

## 2013-12-22 MED ORDER — ONDANSETRON HCL 4 MG/2ML IJ SOLN
INTRAMUSCULAR | Status: AC
  Filled 2013-12-22: qty 2

## 2013-12-22 MED ORDER — PROPOFOL 10 MG/ML IV BOLUS
INTRAVENOUS | Status: AC
  Filled 2013-12-22: qty 20

## 2013-12-22 MED ORDER — FENTANYL CITRATE 0.05 MG/ML IJ SOLN: 25.0000 ug | INTRAMUSCULAR | Status: DC | PRN

## 2013-12-22 MED ORDER — WARFARIN SODIUM 7.5 MG PO TABS
7.5000 mg | ORAL_TABLET | Freq: Once | ORAL | Status: AC
  Administered 2013-12-22: 7.5 mg via ORAL
  Filled 2013-12-22: qty 1

## 2013-12-22 MED ORDER — HYDROCODONE-ACETAMINOPHEN 7.5-325 MG PO TABS
1.0000 | ORAL_TABLET | Freq: Four times a day (QID) | ORAL | Status: DC
Start: 2013-12-22 — End: 2013-12-23

## 2013-12-22 MED ORDER — SODIUM CHLORIDE 0.9 % IV SOLN
INTRAVENOUS | Status: DC
  Filled 2013-12-22 (×5): qty 1000

## 2013-12-22 MED ORDER — HYDROMORPHONE HCL 1 MG/ML IJ SOLN
0.5000 mg | INTRAMUSCULAR | Status: DC | PRN
  Filled 2013-12-22: qty 1

## 2013-12-22 MED ORDER — DOCUSATE SODIUM 100 MG PO CAPS
100.0000 mg | ORAL_CAPSULE | Freq: Two times a day (BID) | ORAL | Status: DC
  Administered 2013-12-22 – 2013-12-23 (×2): 100 mg via ORAL

## 2013-12-22 MED ORDER — CEFAZOLIN SODIUM-DEXTROSE 2-3 GM-% IV SOLR
INTRAVENOUS | Status: DC | PRN
  Administered 2013-12-22: 2 g via INTRAVENOUS

## 2013-12-22 MED ORDER — WARFARIN - PHARMACIST DOSING INPATIENT: Freq: Every day | Status: DC

## 2013-12-22 MED ORDER — DEXAMETHASONE SODIUM PHOSPHATE 10 MG/ML IJ SOLN
10.0000 mg | Freq: Once | INTRAMUSCULAR | Status: AC
  Administered 2013-12-23: 10 mg via INTRAVENOUS
  Filled 2013-12-22: qty 1

## 2013-12-22 MED ORDER — FERROUS SULFATE 325 (65 FE) MG PO TABS: 325.0000 mg | ORAL_TABLET | Freq: Two times a day (BID) | ORAL | Status: DC

## 2013-12-22 MED ORDER — ONDANSETRON HCL 4 MG/2ML IJ SOLN
INTRAMUSCULAR | Status: DC | PRN
  Administered 2013-12-22: 4 mg via INTRAVENOUS

## 2013-12-22 MED ORDER — LACTATED RINGERS IV SOLN
INTRAVENOUS | Status: DC
  Administered 2013-12-22 (×2): via INTRAVENOUS

## 2013-12-22 MED ORDER — METOCLOPRAMIDE HCL 5 MG/ML IJ SOLN: 5.0000 mg | Freq: Three times a day (TID) | INTRAMUSCULAR | Status: DC | PRN

## 2013-12-22 MED ORDER — HYDROMORPHONE HCL 1 MG/ML IJ SOLN
INTRAMUSCULAR | Status: AC
  Administered 2013-12-22: 1 mg
  Filled 2013-12-22: qty 1

## 2013-12-22 MED ORDER — MIDAZOLAM HCL 5 MG/5ML IJ SOLN
INTRAMUSCULAR | Status: DC | PRN
  Administered 2013-12-22: 2 mg via INTRAVENOUS

## 2013-12-22 MED ORDER — ONDANSETRON HCL 4 MG/2ML IJ SOLN: 4.0000 mg | Freq: Four times a day (QID) | INTRAMUSCULAR | Status: DC | PRN

## 2013-12-22 MED ORDER — PROPOFOL INFUSION 10 MG/ML OPTIME
INTRAVENOUS | Status: DC | PRN
  Administered 2013-12-22: 140 ug/kg/min via INTRAVENOUS

## 2013-12-22 MED ORDER — MIDAZOLAM HCL 2 MG/2ML IJ SOLN
INTRAMUSCULAR | Status: AC
  Filled 2013-12-22: qty 2

## 2013-12-22 MED ORDER — WARFARIN SODIUM 5 MG PO TABS: 5.0000 mg | ORAL_TABLET | ORAL | Status: DC

## 2013-12-22 MED ORDER — METHOCARBAMOL 500 MG PO TABS
500.0000 mg | ORAL_TABLET | Freq: Four times a day (QID) | ORAL | Status: DC | PRN
  Administered 2013-12-22 – 2013-12-23 (×2): 500 mg via ORAL
  Filled 2013-12-22 (×2): qty 1

## 2013-12-22 MED ORDER — CEFAZOLIN SODIUM 1-5 GM-% IV SOLN
1.0000 g | Freq: Four times a day (QID) | INTRAVENOUS | Status: AC
  Administered 2013-12-22 – 2013-12-23 (×2): 1 g via INTRAVENOUS
  Filled 2013-12-22 (×2): qty 50

## 2013-12-22 MED ORDER — FENTANYL CITRATE 0.05 MG/ML IJ SOLN
INTRAMUSCULAR | Status: AC
  Filled 2013-12-22: qty 2

## 2013-12-22 MED ORDER — ENOXAPARIN SODIUM 40 MG/0.4ML ~~LOC~~ SOLN
40.0000 mg | SUBCUTANEOUS | Status: DC
  Administered 2013-12-23: 40 mg via SUBCUTANEOUS
  Filled 2013-12-22 (×2): qty 0.4

## 2013-12-22 MED ORDER — DEXAMETHASONE SODIUM PHOSPHATE 10 MG/ML IJ SOLN
INTRAMUSCULAR | Status: DC | PRN
  Administered 2013-12-22: 10 mg via INTRAVENOUS

## 2013-12-22 MED ORDER — ACETAMINOPHEN 325 MG PO TABS: 650.0000 mg | ORAL_TABLET | Freq: Four times a day (QID) | ORAL | Status: DC | PRN

## 2013-12-22 MED ORDER — ONDANSETRON HCL 4 MG PO TABS: 4.0000 mg | ORAL_TABLET | Freq: Four times a day (QID) | ORAL | Status: DC | PRN

## 2013-12-22 MED ORDER — PROMETHAZINE HCL 25 MG/ML IJ SOLN: 6.2500 mg | INTRAMUSCULAR | Status: DC | PRN

## 2013-12-22 MED ORDER — FERROUS SULFATE 325 (65 FE) MG PO TABS
325.0000 mg | ORAL_TABLET | Freq: Two times a day (BID) | ORAL | Status: DC
  Administered 2013-12-23: 325 mg via ORAL
  Filled 2013-12-22 (×3): qty 1

## 2013-12-22 MED ORDER — SODIUM CHLORIDE 0.9 % IR SOLN
Status: DC | PRN
  Administered 2013-12-22: 1

## 2013-12-22 MED ORDER — HYDROCODONE-ACETAMINOPHEN 7.5-325 MG PO TABS
1.0000 | ORAL_TABLET | Freq: Four times a day (QID) | ORAL | Status: DC
  Administered 2013-12-22 – 2013-12-23 (×3): 2 via ORAL
  Filled 2013-12-22 (×3): qty 2

## 2013-12-22 MED ORDER — REMIFENTANIL HCL 1 MG IV SOLR
INTRAVENOUS | Status: AC
  Filled 2013-12-22: qty 1000

## 2013-12-22 MED ORDER — FENTANYL CITRATE 0.05 MG/ML IJ SOLN
INTRAMUSCULAR | Status: DC | PRN
  Administered 2013-12-22 (×2): 50 ug via INTRAVENOUS

## 2013-12-22 MED ORDER — BUPIVACAINE IN DEXTROSE 0.75-8.25 % IT SOLN
INTRATHECAL | Status: DC | PRN
  Administered 2013-12-22: 2 mL via INTRATHECAL

## 2013-12-22 MED ORDER — MENTHOL 3 MG MT LOZG: 1.0000 | LOZENGE | OROMUCOSAL | Status: DC | PRN

## 2013-12-22 MED ORDER — LIDOCAINE HCL (CARDIAC) 20 MG/ML IV SOLN
INTRAVENOUS | Status: DC | PRN
  Administered 2013-12-22: 100 mg via INTRAVENOUS

## 2013-12-22 MED ORDER — PHENOL 1.4 % MT LIQD: 1.0000 | OROMUCOSAL | Status: DC | PRN

## 2013-12-22 MED ORDER — METHOCARBAMOL 500 MG PO TABS: 500.0000 mg | ORAL_TABLET | Freq: Three times a day (TID) | ORAL | Status: DC

## 2013-12-22 MED ORDER — METHOCARBAMOL 1000 MG/10ML IJ SOLN
500.0000 mg | Freq: Four times a day (QID) | INTRAMUSCULAR | Status: DC | PRN
  Administered 2013-12-22: 500 mg via INTRAVENOUS
  Filled 2013-12-22: qty 5

## 2013-12-22 MED ORDER — ACETAMINOPHEN 650 MG RE SUPP: 650.0000 mg | Freq: Four times a day (QID) | RECTAL | Status: DC | PRN

## 2013-12-22 MED ORDER — CEFAZOLIN SODIUM-DEXTROSE 2-3 GM-% IV SOLR
INTRAVENOUS | Status: AC
Start: 2013-12-22 — End: 2013-12-22
  Filled 2013-12-22: qty 50

## 2013-12-22 MED ORDER — HYDROMORPHONE HCL 1 MG/ML IJ SOLN
0.2500 mg | INTRAMUSCULAR | Status: DC | PRN
  Administered 2013-12-22 (×2): 0.5 mg via INTRAVENOUS

## 2013-12-22 SURGICAL SUPPLY — 40 items
ADH SKN CLS APL DERMABOND .7 (GAUZE/BANDAGES/DRESSINGS) ×1
BAG SPEC THK2 15X12 ZIP CLS (MISCELLANEOUS)
BAG ZIPLOCK 12X15 (MISCELLANEOUS) IMPLANT
CAPT HIP PF MOP ×2 IMPLANT
COVER PERINEAL POST (MISCELLANEOUS) ×3 IMPLANT
DERMABOND ADVANCED (GAUZE/BANDAGES/DRESSINGS) ×2
DERMABOND ADVANCED .7 DNX12 (GAUZE/BANDAGES/DRESSINGS) ×1 IMPLANT
DRAPE C-ARM 42X120 X-RAY (DRAPES) ×3 IMPLANT
DRAPE STERI IOBAN 125X83 (DRAPES) ×3 IMPLANT
DRAPE U-SHAPE 47X51 STRL (DRAPES) ×9 IMPLANT
DRSG AQUACEL AG ADV 3.5X10 (GAUZE/BANDAGES/DRESSINGS) ×3 IMPLANT
DURAPREP 26ML APPLICATOR (WOUND CARE) ×3 IMPLANT
ELECT BLADE TIP CTD 4 INCH (ELECTRODE) ×3 IMPLANT
ELECT PENCIL ROCKER SW 15FT (MISCELLANEOUS) IMPLANT
ELECT REM PT RETURN 15FT ADLT (MISCELLANEOUS) IMPLANT
ELECT REM PT RETURN 9FT ADLT (ELECTROSURGICAL) ×3
ELECTRODE REM PT RTRN 9FT ADLT (ELECTROSURGICAL) ×1 IMPLANT
FACESHIELD WRAPAROUND (MASK) ×12 IMPLANT
FACESHIELD WRAPAROUND OR TEAM (MASK) ×4 IMPLANT
GLOVE BIOGEL PI IND STRL 7.5 (GLOVE) ×1 IMPLANT
GLOVE BIOGEL PI IND STRL 8.5 (GLOVE) ×1 IMPLANT
GLOVE BIOGEL PI INDICATOR 7.5 (GLOVE) ×2
GLOVE BIOGEL PI INDICATOR 8.5 (GLOVE) ×2
GLOVE ECLIPSE 8.0 STRL XLNG CF (GLOVE) ×3 IMPLANT
GLOVE ORTHO TXT STRL SZ7.5 (GLOVE) ×6 IMPLANT
GOWN SPEC L3 XXLG W/TWL (GOWN DISPOSABLE) ×3 IMPLANT
GOWN STRL REUS W/TWL LRG LVL3 (GOWN DISPOSABLE) ×3 IMPLANT
HOLDER FOLEY CATH W/STRAP (MISCELLANEOUS) ×3 IMPLANT
KIT BASIN OR (CUSTOM PROCEDURE TRAY) ×3 IMPLANT
PACK TOTAL JOINT (CUSTOM PROCEDURE TRAY) ×3 IMPLANT
SAW OSC TIP CART 19.5X105X1.3 (SAW) ×3 IMPLANT
SUT MNCRL AB 4-0 PS2 18 (SUTURE) ×3 IMPLANT
SUT VIC AB 1 CT1 36 (SUTURE) ×9 IMPLANT
SUT VIC AB 2-0 CT1 27 (SUTURE) ×6
SUT VIC AB 2-0 CT1 TAPERPNT 27 (SUTURE) ×2 IMPLANT
SUT VLOC 180 0 24IN GS25 (SUTURE) ×3 IMPLANT
TOWEL OR 17X26 10 PK STRL BLUE (TOWEL DISPOSABLE) ×3 IMPLANT
TOWEL OR NON WOVEN STRL DISP B (DISPOSABLE) ×2 IMPLANT
TRAY FOLEY CATH 14FRSI W/METER (CATHETERS) ×3 IMPLANT
WATER STERILE IRR 1500ML POUR (IV SOLUTION) ×3 IMPLANT

## 2013-12-22 NOTE — Anesthesia Postprocedure Evaluation (Signed)
  Anesthesia Post-op Note  Patient: [name]  Procedure(s) Performed: Procedure(s) (LRB): RIGHT TOTAL HIP ARTHROPLASTY ANTERIOR APPROACH (Right)  Patient Location: PACU  Anesthesia Type: Spinal  Level of Consciousness: awake and alert   Airway and Oxygen Therapy: Patient Spontanous Breathing  Post-op Pain: mild  Post-op Assessment: Post-op Vital signs reviewed, Patient's Cardiovascular Status Stable, Respiratory Function Stable, Patent Airway and No signs of Nausea or vomiting  Last Vitals:  Filed Vitals:   12/22/13 1325  BP: 142/79  Pulse: 72  Temp: 36.7 C  Resp: 18    Post-op Vital Signs: stable   Complications: No apparent anesthesia complications

## 2013-12-22 NOTE — Plan of Care (Signed)
Problem: Consults Goal: Diagnosis- Total Joint Replacement Right anterior hip     

## 2013-12-22 NOTE — Discharge Instructions (Signed)
Weight bearing as tolerated. Home Health Agency will follow you at home for your therapy and to manage your Coumadin. Shower only, no tub bath. Do not remove dressing Call if any temperatures greater than 101 or any wound complications: [phone]

## 2013-12-22 NOTE — Progress Notes (Signed)
ANTICOAGULATION CONSULT NOTE - Initial Consult  Pharmacy Consult for Warfarin Indication: VTE prophylaxis  No Known Allergies  Patient Measurements: Height: 5\' 8"  (172.7 cm) Weight: 176 lb 6 oz (80.003 kg) (copied forward per protocol) IBW/kg (Calculated) : 68.4   Vital Signs: Temp: 97.8 F (36.6 C) (10/02 1803) Temp Source: Oral (10/02 1325) BP: 125/77 mmHg (10/02 1803) Pulse Rate: 63 (10/02 1803)  Labs: No results found for this basename: HGB, HCT, PLT, APTT, LABPROT, INR, HEPARINUNFRC, CREATININE, CKTOTAL, CKMB, TROPONINI,  in the last 72 hours  Estimated Creatinine Clearance: 70.1 ml/min (by C-G formula based on Cr of 0.99).   Medical History: Past Medical History  Diagnosis Date  . Back pain   . DVT (deep venous thrombosis)   . Pulmonary embolism 2009  . Hyperlipidemia   . Anticoagulated on Coumadin   . Vertigo   . Family history of anesthesia complication     BROTHER HAD MALIGNANT HYPOTHERMIA - PT HAS NOT HAD ANY PROBLEMS WITH ANESTHESIA    . Avascular necrosis of bone of right hip   . History of kidney stones       Assessment: 33 yoM on chronic warfarin PTA for history of DVT/PE and now s/p right THA 10/2.  Pharmacy consulted to dose warfarin post-op.  Home warfarin dose is 5 mg daily except takes 2.5 mg on Tuesdays and Fridays per anticoagulation clinic notes.  Last dose documented as 9/23.  Has been on Lovenox 80 mg BID for bridge and last dose was 10/1 at 0800.  Lovenox 40 mg daily will start AM of POD1 per ortho.  Goal of Therapy:  INR 2-3  Plan:  1.  Warfarin 7.5 mg once tonight then plan to resume home dosing. 2.  Daily INR.  Clance Boll 12/22/2013,6:17 PM

## 2013-12-22 NOTE — H&P (View-Only) (Signed)
TOTAL HIP ADMISSION H&P  Patient is admitted for right total hip arthroplasty, anterior approach.  Subjective:  Chief Complaint:    Right hip OA / pain  HPI: [name], 68 y.o. male, has a history of pain and functional disability in the right hip(s) due to arthritis and patient has failed non-surgical conservative treatments for greater than 12 weeks to include NSAID's and/or analgesics and activity modification.  Onset of symptoms was gradual starting 1+ years ago with gradually worsening course since that time.The patient noted no past surgery on the right hip(s).  Patient currently rates pain in the right hip at 10 out of 10 with activity. Patient has worsening of pain with activity and weight bearing, trendelenberg gait, pain that interfers with activities of daily living and pain with passive range of motion. Patient has evidence of periarticular osteophytes and joint space narrowing by imaging studies. This condition presents safety issues increasing the risk of falls. There is no current active infection.  Risks, benefits and expectations were discussed with the patient.  Risks including but not limited to the risk of anesthesia, blood clots, nerve damage, blood vessel damage, failure of the prosthesis, infection and up to and including death.  Patient understand the risks, benefits and expectations and wishes to proceed with surgery.   PCP: Harlow Asa, MD  D/C Plans:      Home with HHPT  Post-op Meds:       No Rx given  Tranexamic Acid:      To be given - topically (previous DVT / PE)  Decadron:      Is to be given  FYI:     Lovenox 80 mg bid post-op with Coumadin  Norco post-op   Patient Active Problem List   Diagnosis Date Noted  . Elevated PSA 01/18/2013  . Erectile dysfunction 01/03/2013  . Long term (current) use of anticoagulants 06/25/2012   Past Medical History  Diagnosis Date  . Back pain   . DVT (deep venous thrombosis)   . Pulmonary embolism   .  Hyperlipidemia   . Anticoagulated on Coumadin   . Vertigo     Past Surgical History  Procedure Laterality Date  . Cervical fusion      No prescriptions prior to admission   No Known Allergies   History  Substance Use Topics  . Smoking status: Never Smoker   . Smokeless tobacco: Not on file  . Alcohol Use: Not on file    Family History  Problem Relation Age of Onset  . Breast cancer Mother   . Hypertension Mother   . Hyperlipidemia Mother   . Heart attack Mother   . Hypertension Other   . Diabetes Other      Review of Systems  Constitutional: Negative.   HENT: Negative.   Eyes: Negative.   Respiratory: Negative.   Cardiovascular: Negative.   Gastrointestinal: Negative.   Genitourinary: Negative.   Musculoskeletal: Positive for back pain and joint pain.  Skin: Negative.   Neurological: Negative.   Endo/Heme/Allergies: Negative.   Psychiatric/Behavioral: Negative.     Objective:  Physical Exam  Constitutional: He is oriented to person, place, and time. He appears well-developed and well-nourished.  HENT:  Head: Normocephalic and atraumatic.  Mouth/Throat: Oropharynx is clear and moist.  Eyes: Pupils are equal, round, and reactive to light.  Neck: Neck supple. No JVD present. No tracheal deviation present. No thyromegaly present.  Cardiovascular: Normal rate, regular rhythm, normal heart sounds and intact distal pulses.  Respiratory: Effort normal and breath sounds normal. No stridor. No respiratory distress. He has no wheezes.  GI: Soft. There is no tenderness. There is no guarding.  Musculoskeletal:       Right hip: He exhibits decreased range of motion, decreased strength, tenderness and bony tenderness. He exhibits no swelling, no deformity and no laceration.  Lymphadenopathy:    He has no cervical adenopathy.  Neurological: He is alert and oriented to person, place, and time.  Skin: Skin is warm and dry.  Psychiatric: He has a normal mood and affect.      Labs:  Estimated body mass index is 26.51 kg/(m^2) as calculated from the following:   Height as of 01/18/13: 5' 7.5" (1.715 m).   Weight as of 01/18/13: 77.973 kg (171 lb 14.4 oz).   Imaging Review Plain radiographs demonstrate severe degenerative joint disease of the right hip(s). The bone quality appears to be good for age and reported activity level.  Assessment/Plan:  End stage arthritis, right hip(s)  The patient history, physical examination, clinical judgement of the provider and imaging studies are consistent with end stage degenerative joint disease of the right hip(s) and total hip arthroplasty is deemed medically necessary. The treatment options including medical management, injection therapy, arthroscopy and arthroplasty were discussed at length. The risks and benefits of total hip arthroplasty were presented and reviewed. The risks due to aseptic loosening, infection, stiffness, dislocation/subluxation,  thromboembolic complications and other imponderables were discussed.  The patient acknowledged the explanation, agreed to proceed with the plan and consent was signed. Patient is being admitted for inpatient treatment for surgery, pain control, PT, OT, prophylactic antibiotics, VTE prophylaxis, progressive ambulation and ADL's and discharge planning.The patient is planning to be discharged home with home health services.    Anastasio AuerbachMatthew S. Fawaz Borquez   PA-C  12/03/2013, 12:41 PM

## 2013-12-22 NOTE — Transfer of Care (Signed)
Immediate Anesthesia Transfer of Care Note  Patient: [name]  Procedure(s) Performed: Procedure(s) (LRB): RIGHT TOTAL HIP ARTHROPLASTY ANTERIOR APPROACH (Right)  Patient Location: PACU  Anesthesia Type: Spinal  Level of Consciousness: sedated, patient cooperative and responds to stimulation  Airway & Oxygen Therapy: Patient Spontanous Breathing and Patient connected to face mask oxgen  Post-op Assessment: Report given to PACU RN and Post -op Vital signs reviewed and stable  Post vital signs: Reviewed and stable  Complications: No apparent anesthesia complications

## 2013-12-22 NOTE — Interval H&P Note (Signed)
History and Physical Interval Note:  12/22/2013 2:55 PM  [name]  has presented today for surgery, with the diagnosis of right hip avn  The various methods of treatment have been discussed with the patient and family. After consideration of risks, benefits and other options for treatment, the patient has consented to  Procedure(s): RIGHT TOTAL HIP ARTHROPLASTY ANTERIOR APPROACH (Right) as a surgical intervention .  The patient's history has been reviewed, patient examined, no change in status, stable for surgery.  I have reviewed the patient's chart and labs.  Questions were answered to the patient's satisfaction.     Shelda Pal

## 2013-12-22 NOTE — Progress Notes (Signed)
Utilization review completed.  

## 2013-12-22 NOTE — Op Note (Signed)
NAME:  [name]                ACCOUNT NO.: [id number]      MEDICAL RECORD NO.: [id number]      FACILITY:  [facility]      PHYSICIAN:  Durene Romans D  DATE OF BIRTH:  [birth year]     DATE OF PROCEDURE:  12/22/2013                                 OPERATIVE REPORT         PREOPERATIVE DIAGNOSIS: Right  hip avascular necrosis.      POSTOPERATIVE DIAGNOSIS:  Right hip avascular necrosis.      PROCEDURE:  Right total hip replacement through an anterior approach   utilizing DePuy THR system, component size 52mm pinnacle cup, a size 36+4 neutral   Altrex liner, a size 6 Hi Tri Lock stem with a 36+1.5 Articuleze metal head   ball.      SURGEON:  Madlyn Frankel. Charlann Boxer, M.D.      ASSISTANT:  Leilani Able, PA-C      ANESTHESIA:  Spinal.      SPECIMENS:  None.      COMPLICATIONS:  None.      BLOOD LOSS:  250 cc     DRAINS:  None.      INDICATION OF THE PROCEDURE:  [name] is a 68 y.o. male who had   presented to office for evaluation of right hip pain.  Radiographs revealed   progressive degenerative changes with bone-on-bone   articulation to the  hip joint.  The patient had painful limited range of   motion significantly affecting their overall quality of life.  The patient was failing to    respond to conservative measures, and at this point was ready   to proceed with more definitive measures.  The patient has noted progressive   degenerative changes in his hip, progressive problems and dysfunction   with regarding the hip prior to surgery.  Consent was obtained for   benefit of pain relief.  Specific risk of infection, DVT, component   failure, dislocation, need for revision surgery, as well discussion of   the anterior versus posterior approach were reviewed.  Consent was   obtained for benefit of anterior pain relief through an anterior   approach.      PROCEDURE IN DETAIL:  The patient was brought to operative theater.   Once adequate  anesthesia, preoperative antibiotics, 2gm of Ancef administered.   The patient was positioned supine on the OSI Hanna table.  Once adequate   padding of boney process was carried out, we had predraped out the hip, and  used fluoroscopy to confirm orientation of the pelvis and position.      The right hip was then prepped and draped from proximal iliac crest to   mid thigh with shower curtain technique.      Time-out was performed identifying the patient, planned procedure, and   extremity.     An incision was then made 2 cm distal and lateral to the   anterior superior iliac spine extending over the orientation of the   tensor fascia lata muscle and sharp dissection was carried down to the   fascia of the muscle and protractor placed in the soft tissues.      The fascia was then incised.  The muscle belly  was identified and swept   laterally and retractor placed along the superior neck.  Following   cauterization of the circumflex vessels and removing some pericapsular   fat, a second cobra retractor was placed on the inferior neck.  A third   retractor was placed on the anterior acetabulum after elevating the   anterior rectus.  A L-capsulotomy was along the line of the   superior neck to the trochanteric fossa, then extended proximally and   distally.  Tag sutures were placed and the retractors were then placed   intracapsular.  We then identified the trochanteric fossa and   orientation of my neck cut, confirmed this radiographically   and then made a neck osteotomy with the femur on traction.  The femoral   head was removed without difficulty or complication.  Traction was let   off and retractors were placed posterior and anterior around the   acetabulum.      The labrum and foveal tissue were debrided.  I began reaming with a 47mm   reamer and reamed up to 51mm reamer with good bony bed preparation and a 52mm   cup was chosen.  The final 52mm Pinnacle cup was then impacted  under fluoroscopy  to confirm the depth of penetration and orientation with respect to   abduction.  A screw was placed followed by the hole eliminator.  The final   36+4 neutral Altrex liner was impacted with good visualized rim fit.  The cup was positioned anatomically within the acetabular portion of the pelvis.      At this point, the femur was rolled at 80 degrees.  Further capsule was   released off the inferior aspect of the femoral neck.  I then   released the superior capsule proximally.  The hook was placed laterally   along the femur and elevated manually and held in position with the bed   hook.  The leg was then extended and adducted with the leg rolled to 100   degrees of external rotation.  Once the proximal femur was fully   exposed, I used a box osteotome to set orientation.  I then began   broaching with the starting chili pepper broach and passed this by hand and then broached up to 6.  With the 6 broach in place I chose a high offset neck and did a trial reduction.  The offset was appropriate, leg lengths   appeared to be equal, confirmed radiographically.   Given these findings, I went ahead and dislocated the hip, repositioned all   retractors and positioned the right hip in the extended and abducted position.  The final 6 Hi Tri Lock stem was   chosen and it was impacted down to the level of neck cut.  Based on this   and the trial reduction, a 36+1.5 Articuleze metal head ball was chosen and   impacted onto a clean and dry trunnion, and the hip was reduced.  The   hip had been irrigated throughout the case again at this point.  I did   reapproximate the superior capsular leaflet to the anterior leaflet   using #1 Vicryl.  The fascia of the   tensor fascia lata muscle was then reapproximated using #1 Vicryl.  The   remaining wound was closed with 2-0 Vicryl and running 4-0 Monocryl.   The hip was cleaned, dried, and dressed sterilely using Dermabond and   Aquacel  dressing.  He was then brought  to recovery room in stable condition tolerating the procedure well.    Leilani Able, PA-C was present for the entirety of the case involved from   preoperative positioning, perioperative retractor management, general   facilitation of the case, as well as primary wound closure as assistant.            Madlyn Frankel Charlann Boxer, M.D.        12/22/2013 3:07 PM

## 2013-12-22 NOTE — Addendum Note (Signed)
Addendum created 12/22/13 1706 by Felipe Drone, MD   Modules edited: Orders, PRL Based Order Sets

## 2013-12-22 NOTE — Anesthesia Procedure Notes (Signed)
Spinal  Patient location during procedure: OR Start time: 12/22/2013 3:20 PM End time: 12/22/2013 3:25 PM Staffing Anesthesiologist: Felipe Drone Performed by: anesthesiologist  Preanesthetic Checklist Completed: patient identified, site marked, surgical consent, pre-op evaluation, timeout performed, IV checked, risks and benefits discussed and monitors and equipment checked Spinal Block Patient position: sitting Prep: Betadine Patient monitoring: continuous pulse ox, blood pressure and heart rate Approach: midline Location: L3-4 Injection technique: single-shot Needle Needle type: Sprotte  Needle gauge: 24 G Needle length: 9 cm Additional Notes Functioning IV was confirmed and monitors were applied. Sterile prep and drape, including hand hygiene and sterile gloves were used. The patient was positioned and the spine was prepped. The skin was anesthetized with lidocaine.  Free flow of clear CSF was obtained prior to injecting local anesthetic into the CSF.  The spinal needle aspirated freely following injection.  The needle was carefully withdrawn.  The patient tolerated the procedure well. Karie Schwalbe, MD

## 2013-12-23 LAB — CBC
HEMATOCRIT: 31.4 % — AB (ref 39.0–52.0)
Hemoglobin: 10.7 g/dL — ABNORMAL LOW (ref 13.0–17.0)
MCH: 28.8 pg (ref 26.0–34.0)
MCHC: 34.1 g/dL (ref 30.0–36.0)
MCV: 84.4 fL (ref 78.0–100.0)
Platelets: 255 10*3/uL (ref 150–400)
RBC: 3.72 MIL/uL — ABNORMAL LOW (ref 4.22–5.81)
RDW: 12.7 % (ref 11.5–15.5)
WBC: 8.5 10*3/uL (ref 4.0–10.5)

## 2013-12-23 LAB — PROTIME-INR
INR: 1.1 (ref 0.00–1.49)
PROTHROMBIN TIME: 14.3 s (ref 11.6–15.2)

## 2013-12-23 MED ORDER — WARFARIN SODIUM 5 MG PO TABS
5.0000 mg | ORAL_TABLET | Freq: Once | ORAL | Status: DC
  Filled 2013-12-23: qty 1

## 2013-12-23 MED ORDER — METHOCARBAMOL 500 MG PO TABS: 500.0000 mg | ORAL_TABLET | Freq: Three times a day (TID) | ORAL | Status: DC

## 2013-12-23 MED ORDER — POLYETHYLENE GLYCOL 3350 17 G PO PACK: 17.0000 g | PACK | Freq: Two times a day (BID) | ORAL | Status: DC

## 2013-12-23 MED ORDER — DSS 100 MG PO CAPS: 100.0000 mg | ORAL_CAPSULE | Freq: Two times a day (BID) | ORAL | Status: DC

## 2013-12-23 MED ORDER — HYDROCODONE-ACETAMINOPHEN 7.5-325 MG PO TABS: 1.0000 | ORAL_TABLET | Freq: Four times a day (QID) | ORAL | Status: DC

## 2013-12-23 NOTE — Progress Notes (Signed)
Patient ID: [name], male   DOB: 1945-11-15, 68 y.o.   MRN: [id number] Subjective: 1 Day Post-Op Procedure(s) (LRB): RIGHT TOTAL HIP ARTHROPLASTY ANTERIOR APPROACH (Right)    Patient reports pain as mild to moderate.  Did not get out of bed last night.  Ready to try activity  Objective:   VITALS:   Filed Vitals:   12/23/13 0400  BP:   Pulse:   Temp:   Resp: 16    Neurovascular intact Incision: dressing C/D/I  LABS  Recent Labs  12/22/13 1843 12/23/13 0438  HGB 11.4* 10.7*  HCT 34.2* 31.4*  WBC 8.4 8.5  PLT 251 255     Recent Labs  12/22/13 1843  CREATININE 0.93     Recent Labs  12/23/13 0438  INR 1.10     Assessment/Plan: 1 Day Post-Op Procedure(s) (LRB): RIGHT TOTAL HIP ARTHROPLASTY ANTERIOR APPROACH (Right)   Up with therapy Discharge home with home health today if progresses appropriately   Reviewed discharge plans, goals RTC in 2 weeks

## 2013-12-23 NOTE — Evaluation (Signed)
Occupational Therapy Evaluation Patient Details Name: [name] MRN: [id number] DOB: 1945-08-11 Today's Date: 12/23/2013    History of Present Illness R THR   Clinical Impression   Pt. And wife ed. On safe commode transfers with elevated commode. Pt. Was able to stand without A from commode in BR. Pt. Does not need 3-1 and pt. Is an agreement. Pt. Was ed. On increasing ability with shower transfer with pt. [facility]. Pt. And wife state that pt. Has built in low shower seat at home. Advised pt. And wife to wait until HHPT attempts transfer before trying it them selves. Pt. Wife to A with LE ADL's as needed. Pt. Does not require further skilled OT at this time.     Follow Up Recommendations  No OT follow up    Equipment Recommendations  None recommended by OT    Recommendations for Other Services       Precautions / Restrictions Precautions Precautions: Fall Precaution Comments: WBAT Restrictions Weight Bearing Restrictions: No Other Position/Activity Restrictions: Anterior precautions, WBAT      Mobility Bed Mobility Overal bed mobility: Needs Assistance Bed Mobility: Supine to Sit     Supine to sit: Min assist     General bed mobility comments: cues for sequence and use of L LE to self assist  Transfers Overall transfer level: Needs assistance Equipment used: Rolling walker (2 wheeled) Transfers: Sit to/from Stand Sit to Stand: Supervision         General transfer comment:  (Pt. with cues for hand placement for sit to stand. )    Balance                                            ADL Overall ADL's : Needs assistance/impaired Eating/Feeding: Independent   Grooming: Wash/dry hands;Supervision/safety   Upper Body Bathing: Set up   Lower Body Bathing: Minimal assistance   Upper Body Dressing : Set up   Lower Body Dressing: Moderate assistance   Toilet Transfer: Supervision/safety   Toileting- Clothing  Manipulation and Hygiene: Supervision/safety   Tub/ Shower Transfer: Min guard   Functional mobility during ADLs: Supervision/safety General ADL Comments: Pt. ed. on anterior hip precautions.     Vision                     Perception     Praxis      Pertinent Vitals/Pain Pain Assessment: 0-10 Pain Score: 3  Pain Location:  (R hip) Pain Descriptors / Indicators: Aching Pain Intervention(s): Monitored during session     Hand Dominance     Extremity/Trunk Assessment Upper Extremity Assessment Upper Extremity Assessment: Overall WFL for tasks assessed   Lower Extremity Assessment Lower Extremity Assessment: RLE deficits/detail RLE Deficits / Details: 2+/5 hip with AAROM at hip to 90 flex and 20 abd   Cervical / Trunk Assessment Cervical / Trunk Assessment: Normal   Communication Communication Communication: No difficulties   Cognition Arousal/Alertness: Awake/alert Behavior During Therapy: WFL for tasks assessed/performed Overall Cognitive Status: Within Functional Limits for tasks assessed                     General Comments       Exercises Exercises: Total Joint     Shoulder Instructions      Home Living Family/patient expects to be discharged to:: Private  residence Living Arrangements: Spouse/significant other Available Help at Discharge: Family Type of Home: House Home Access: Stairs to enter Secretary/administrator of Steps: 2 Entrance Stairs-Rails: None Home Layout: One level     Bathroom Shower/Tub: Walk-in shower;Curtain   Bathroom Toilet: Handicapped height     Home Equipment: Environmental consultant - 2 wheels;Cane - single point   Additional Comments: Pt. has 4 inch step for shower.      Prior Functioning/Environment Level of Independence: Independent        Comments: using RW prior to surg    OT Diagnosis:     OT Problem List:     OT Treatment/Interventions:      OT Goals(Current goals can be found in the care plan section)  Acute Rehab OT Goals Patient Stated Goal: home today  OT Frequency:     Barriers to D/C:            Co-evaluation              End of Session Equipment Utilized During Treatment: Rolling walker Nurse Communication: Mobility status  Activity Tolerance: Patient tolerated treatment well Patient left: in chair;with call bell/phone within reach;with family/visitor present   Time: 1011-1054 OT Time Calculation (min): 43 min Charges:  OT General Charges $OT Visit: 1 Procedure OT Evaluation $Initial OT Evaluation Tier I: 1 Procedure OT Treatments $Self Care/Home Management : 23-37 mins G-Codes:    [name] January 12, 2014, 10:55 AM

## 2013-12-23 NOTE — Progress Notes (Signed)
Physical Therapy Treatment Patient Details Name: [name] MRN: [id number] DOB: [birth year] Today's Date: 12-29-13    History of Present Illness R THR    PT Comments    Reviewed stairs and car transfers with pt and spouse  Follow Up Recommendations  Home health PT     Equipment Recommendations  None recommended by PT    Recommendations for Other Services OT consult     Precautions / Restrictions Precautions Precautions: Fall Precaution Comments: WBAT Restrictions Weight Bearing Restrictions: No    Mobility  Bed Mobility Overal bed mobility: Needs Assistance Bed Mobility: Sit to Supine       Sit to supine: Min assist   General bed mobility comments: cues for sequence and use of L LE to self assist  Transfers Overall transfer level: Needs assistance Equipment used: Rolling walker (2 wheeled) Transfers: Sit to/from Stand Sit to Stand: Supervision         General transfer comment: cues for LE management and use of UEs to self assist  Ambulation/Gait Ambulation/Gait assistance: Min guard;Supervision Ambulation Distance (Feet): 200 Feet Assistive device: Rolling walker (2 wheeled) Gait Pattern/deviations: Step-through pattern;Shuffle     General Gait Details: cues for posture, position from RW, ER on R and initial sequence   Stairs Stairs: Yes Stairs assistance: Min assist Stair Management: No rails;Step to pattern;Backwards;With walker Number of Stairs: 3 General stair comments: cues for sequence and foot/RW placment  Wheelchair Mobility    Modified Rankin (Stroke Patients Only)       Balance                                    Cognition Arousal/Alertness: Awake/alert Behavior During Therapy: WFL for tasks assessed/performed Overall Cognitive Status: Within Functional Limits for tasks assessed                      Exercises      General Comments        Pertinent Vitals/Pain Pain Assessment: 0-10 Pain  Score: 3  Pain Location: R hip Pain Descriptors / Indicators: Aching;Burning Pain Intervention(s): Limited activity within patient's tolerance;Monitored during session;Premedicated before session;Ice applied    Home Living                      Prior Function            PT Goals (current goals can now be found in the care plan section) Acute Rehab PT Goals Patient Stated Goal: home today PT Goal Formulation: With patient Time For Goal Achievement: 12/26/13 Potential to Achieve Goals: Good Progress towards PT goals: Progressing toward goals    Frequency  7X/week    PT Plan Current plan remains appropriate    Co-evaluation             End of Session Equipment Utilized During Treatment: Gait belt Activity Tolerance: Patient tolerated treatment well Patient left: in bed;with call bell/phone within reach;with family/visitor present     Time: 1340-1400 PT Time Calculation (min): 20 min  Charges:  $Gait Training: 8-22 mins                    G Codes:      [name] December 29, 2013, 2:02 PM

## 2013-12-23 NOTE — Evaluation (Signed)
Physical Therapy Evaluation Patient Details Name: [name] MRN: [id number] DOB: 1946-03-04 Today's Date: 12/23/2013   History of Present Illness  R THR  Clinical Impression  Pt s/p R THR presents with decreased R LE strength/ROM and post op pain limiting functional mobility.  Pt should progress well to d/c home with family assist and HHPT follow up.    Follow Up Recommendations Home health PT    Equipment Recommendations  None recommended by PT    Recommendations for Other Services OT consult     Precautions / Restrictions Precautions Precautions: Fall Restrictions Weight Bearing Restrictions: No      Mobility  Bed Mobility Overal bed mobility: Needs Assistance Bed Mobility: Supine to Sit     Supine to sit: Min assist     General bed mobility comments: cues for sequence and use of L LE to self assist  Transfers Overall transfer level: Needs assistance Equipment used: Rolling walker (2 wheeled) Transfers: Sit to/from Stand Sit to Stand: Min assist         General transfer comment: cues for LE management and use of UEs to self assist  Ambulation/Gait Ambulation/Gait assistance: Min assist Ambulation Distance (Feet): 140 Feet Assistive device: Rolling walker (2 wheeled) Gait Pattern/deviations: Step-to pattern;Step-through pattern;Shuffle;Trunk flexed;Decreased step length - right     General Gait Details: cues for posture, position from RW, ER on R and initial sequence  Stairs            Wheelchair Mobility    Modified Rankin (Stroke Patients Only)       Balance                                             Pertinent Vitals/Pain Pain Assessment: 0-10 Pain Score: 3  Pain Location: R hip Pain Descriptors / Indicators: Aching;Burning Pain Intervention(s): Limited activity within patient's tolerance;Monitored during session;Premedicated before session;Ice applied    Home Living Family/patient expects to be discharged  to:: Private residence Living Arrangements: Spouse/significant other Available Help at Discharge: Family Type of Home: House Home Access: Stairs to enter Entrance Stairs-Rails: None Entrance Stairs-Number of Steps: 2 Home Layout: One level Home Equipment: Environmental consultant - 2 wheels;Cane - single point      Prior Function Level of Independence: Independent with assistive device(s)         Comments: using RW prior to surg     Hand Dominance        Extremity/Trunk Assessment   Upper Extremity Assessment: Overall WFL for tasks assessed           Lower Extremity Assessment: RLE deficits/detail RLE Deficits / Details: 2+/5 hip with AAROM at hip to 90 flex and 20 abd    Cervical / Trunk Assessment: Normal  Communication   Communication: No difficulties  Cognition Arousal/Alertness: Awake/alert Behavior During Therapy: WFL for tasks assessed/performed Overall Cognitive Status: Within Functional Limits for tasks assessed                      General Comments      Exercises Total Joint Exercises Ankle Circles/Pumps: AROM;Both;15 reps;Supine Quad Sets: AROM;Both;10 reps;Supine Heel Slides: AAROM;Right;15 reps;Supine Hip ABduction/ADduction: AAROM;Right;15 reps;Supine      Assessment/Plan    PT Assessment Patient needs continued PT services  PT Diagnosis Difficulty walking   PT Problem List Decreased strength;Decreased range of motion;Decreased activity tolerance;Decreased mobility;Decreased knowledge  of use of DME;Pain  PT Treatment Interventions DME instruction;Gait training;Stair training;Functional mobility training;Therapeutic activities;Therapeutic exercise;Patient/family education   PT Goals (Current goals can be found in the Care Plan section) Acute Rehab PT Goals Patient Stated Goal: Home to resume previous lifestyle with decreased pain PT Goal Formulation: With patient Time For Goal Achievement: 12/26/13 Potential to Achieve Goals: Good    Frequency  7X/week   Barriers to discharge        Co-evaluation               End of Session Equipment Utilized During Treatment: Gait belt Activity Tolerance: Patient tolerated treatment well Patient left: in chair;with call bell/phone within reach;with family/visitor present Nurse Communication: Mobility status         Time: [id number] PT Time Calculation (min): 33 min   Charges:   PT Evaluation $Initial PT Evaluation Tier I: 1 Procedure PT Treatments $Gait Training: 8-22 mins $Therapeutic Exercise: 8-22 mins   PT G Codes:          Pearlie Lafosse 12/23/2013, 9:30 AM

## 2013-12-23 NOTE — Progress Notes (Signed)
ANTICOAGULATION CONSULT NOTE  Pharmacy Consult for Warfarin Indication: VTE prophylaxis  No Known Allergies  Patient Measurements: Height: 5\' 8"  (172.7 cm) Weight: 176 lb (79.833 kg) IBW/kg (Calculated) : 68.4   Vital Signs: Temp: 98.4 F (36.9 C) (10/03 0135) Temp Source: Oral (10/03 0135) BP: 116/73 mmHg (10/03 0135) Pulse Rate: 95 (10/03 0135)  Labs:  Recent Labs  12/22/13 1843 12/23/13 0438  HGB 11.4* 10.7*  HCT 34.2* 31.4*  PLT 251 255  LABPROT  --  14.3  INR  --  1.10  CREATININE 0.93  --     Estimated Creatinine Clearance: 74.6 ml/min (by C-G formula based on Cr of 0.93).   Medical History: Past Medical History  Diagnosis Date  . Back pain   . DVT (deep venous thrombosis)   . Pulmonary embolism 2009  . Hyperlipidemia   . Anticoagulated on Coumadin   . Vertigo   . Family history of anesthesia complication     BROTHER HAD MALIGNANT HYPOTHERMIA - PT HAS NOT HAD ANY PROBLEMS WITH ANESTHESIA    . Avascular necrosis of bone of right hip   . History of kidney stones       Assessment: 7 yoM on chronic warfarin PTA for history of DVT/PE and now s/p right THA 10/2.  Pharmacy consulted to dose warfarin post-op. Pt has been on Lovenox 80 mg BID for bridge and last dose was 10/1 at 0800.  Lovenox 40 mg daily will start AM of POD1 per ortho.   Home warfarin dose is 5 mg daily except takes 2.5 mg on Tuesdays and Fridays per anticoagulation clinic notes.  Diet: regular. No intake documented  Drug interactions: none noted  Warfarin doses: 10/2: warfarin 7.5mg   Today 10/3:  INR 1.1  No bleeding noted  CBC WNL  Goal of Therapy:  INR 2-3  Plan:  1. Give warfarin 5mg  po x1 today 2.  Daily INR.  Loma Boston, PharmD Pager: [phone] 12/23/2013 8:17 AM

## 2013-12-23 NOTE — Progress Notes (Signed)
Pt discharged home with wife, discharge instructions and prescriptions given. Pt. And wife verbalized signs/symptoms of worsening condition.   Ok Anis, RN 12/23/2013 2:57 PM

## 2013-12-23 NOTE — Progress Notes (Addendum)
CARE MANAGEMENT NOTE 12/23/2013  Patient:  [name], [name]   Account Number:  [id number]  Date Initiated:  12/23/2013  Documentation initiated by:  [facility]  Subjective/Objective Assessment:   RIGHT TOTAL HIP ARTHROPLASTY ANTERIOR APPROACH     Action/Plan:   lives at home with wife   Anticipated DC Date:  12/23/2013   Anticipated DC Plan:  HOME W HOME HEALTH SERVICES      DC Planning Services  CM consult      [facility]  HOME HEALTH   Choice offered to / List presented to:  C-3 Spouse        HH arranged  HH-2 PT      [facility] agency  [facility]   Status of service:  Completed, signed off Medicare Important Message given?   (If response is "NO", the following Medicare IM given date fields will be blank) Date Medicare IM given:   Medicare IM given by:   Date Additional Medicare IM given:   Additional Medicare IM given by:    Discharge Disposition:  HOME W HOME HEALTH SERVICES  Per UR Regulation:    If discussed at Long Length of Stay Meetings, dates discussed:    Comments:  Elliot Cousin, RN Case Manager Signed CASE MANAGEMENT Progress Notes Service date: 12/23/2013 10:34 AM NCM spoke to pt and gave permission to speak to wife, [name]. Offered choice for [facility] and wife agreeable to [place] for [facility]. Notified Genevieve Norlander and they can accept referral. 3n1 bedside commode is not needed. Order cancelled. Wife states pt has RW, elevated commode seat and shower chair at home. Isidoro Donning RN CCM Case Mgmt phone [phone]

## 2013-12-25 ENCOUNTER — Encounter (HOSPITAL_COMMUNITY): Payer: Self-pay | Admitting: Orthopedic Surgery

## 2013-12-27 ENCOUNTER — Other Ambulatory Visit: Payer: Self-pay | Admitting: *Deleted

## 2013-12-27 MED ORDER — ENOXAPARIN SODIUM 80 MG/0.8ML ~~LOC~~ SOLN: 80.0000 mg | Freq: Two times a day (BID) | SUBCUTANEOUS | Status: DC

## 2013-12-29 ENCOUNTER — Telehealth: Payer: Self-pay | Admitting: *Deleted

## 2013-12-29 NOTE — Telephone Encounter (Signed)
Home health called to state INR 2.2- consult with Dr. Lorin Picket STOP lovenox and go to Coumadin 5mg  one tablet a day and recheck INR on Tuesday 01/02/14. Home Health verbalized understanding.

## 2013-12-29 NOTE — Telephone Encounter (Signed)
This patient has been able to maintain his INR in a good range with just one per day so therefore we can we will do one per day and recheck INR on Tuesday.

## 2014-01-02 ENCOUNTER — Telehealth: Payer: Self-pay | Admitting: *Deleted

## 2014-01-02 NOTE — Telephone Encounter (Signed)
ok 

## 2014-01-02 NOTE — Telephone Encounter (Signed)
Home health nurse states she was suppose to do INR today but pt had an appt and could not get done today. Nurse states she will go out in the morning and do INR.

## 2014-01-03 ENCOUNTER — Telehealth: Payer: Self-pay | Admitting: *Deleted

## 2014-01-03 NOTE — Telephone Encounter (Signed)
[facility] nurse calling to report INR done today. 2.4. Takes 5mg  every day. Angel's number [phone].

## 2014-01-03 NOTE — Telephone Encounter (Signed)
Great cst re ck in two wks either here or hh nurse

## 2014-01-03 NOTE — Telephone Encounter (Signed)
Discussed with pt and with angela from home health. Pt scheduled office visit here in two weeks for INR.

## 2014-01-10 NOTE — Discharge Summary (Signed)
Physician Discharge Summary  Patient ID: [name] MRN: [id number] DOB/AGE: Dec 25, 1945 68 y.o.  Admit date: 12/22/2013 Discharge date: 12/23/2013   Procedures:  Procedure(s) (LRB): RIGHT TOTAL HIP ARTHROPLASTY ANTERIOR APPROACH (Right)  Attending Physician:  Dr. Durene Romans   Admission Diagnoses:   Right hip OA / pain  Discharge Diagnoses:  Active Problems:   Status post total replacement of right hip  Past Medical History  Diagnosis Date  . Back pain   . DVT (deep venous thrombosis)   . Pulmonary embolism 2009  . Hyperlipidemia   . Anticoagulated on Coumadin   . Vertigo   . Family history of anesthesia complication     BROTHER HAD MALIGNANT HYPOTHERMIA - PT HAS NOT HAD ANY PROBLEMS WITH ANESTHESIA    . Avascular necrosis of bone of right hip   . History of kidney stones     HPI: [name], 68 y.o. male, has a history of pain and functional disability in the right hip(s) due to arthritis and patient has failed non-surgical conservative treatments for greater than 12 weeks to include NSAID's and/or analgesics and activity modification. Onset of symptoms was gradual starting 1+ years ago with gradually worsening course since that time.The patient noted no past surgery on the right hip(s). Patient currently rates pain in the right hip at 10 out of 10 with activity. Patient has worsening of pain with activity and weight bearing, trendelenberg gait, pain that interfers with activities of daily living and pain with passive range of motion. Patient has evidence of periarticular osteophytes and joint space narrowing by imaging studies. This condition presents safety issues increasing the risk of falls. There is no current active infection. Risks, benefits and expectations were discussed with the patient. Risks including but not limited to the risk of anesthesia, blood clots, nerve damage, blood vessel damage, failure of the prosthesis, infection and up to and including death.  Patient understand the risks, benefits and expectations and wishes to proceed with surgery.  PCP: Harlow Asa, MD   Discharged Condition: good  Hospital Course:  Patient underwent the above stated procedure on 12/22/2013. Patient tolerated the procedure well and brought to the recovery room in good condition and subsequently to the floor.  POD #1 Patient reports pain as mild to moderate. Did not get out of bed last night. Ready to try activity. Ready to be discharged home. Dorsiflexion/plantar flexion intact, incision: dressing C/D/I, no cellulitis present and compartment soft.   LABS  Basename    HGB  10.7  HCT  31.4    Discharge Exam: General appearance: alert, cooperative and no distress Extremities: Homans sign is negative, no sign of DVT, no edema, redness or tenderness in the calves or thighs and no ulcers, gangrene or trophic changes  Disposition: Home with follow up in 2 weeks   Follow-up Information   Follow up with Shelda Pal, MD. Schedule an appointment as soon as possible for a visit in 2 weeks.   Specialty:  Orthopedic Surgery   Contact information:   [address] [place] [address] [phone]       Follow up In 2 weeks. (For wound check)       Follow up with [facility]. [facility] RN and Physical Therapy)    Contact information:   [address] [place] [address] [phone]       Discharge Instructions   Call MD / Call 911    Complete by:  As  directed   If you experience chest pain or shortness of breath, CALL 911 and be transported to the hospital emergency room.  If you develope a fever above 101 F, pus (white drainage) or increased drainage or redness at the wound, or calf pain, call your surgeon's office.     Constipation Prevention    Complete by:  As directed   Drink plenty of fluids.  Prune juice may be helpful.  You may use a stool softener, such as Colace (over the counter) 100 mg twice a day.   Use MiraLax (over the counter) for constipation as needed.     Diet general    Complete by:  As directed      Discharge instructions    Complete by:  As directed   Weight bearing as tolerated. Home Health Agency will follow you at home for your therapy and to manage your Coumadin. Shower only, no tub bath. Do not remove dressing Call if any temperatures greater than 101 or any wound complications: [phone]     Driving restrictions    Complete by:  As directed   No driving for 2 weeks     Increase activity slowly as tolerated    Complete by:  As directed              Medication List    STOP taking these medications       acetaminophen 500 MG tablet  Commonly known as:  TYLENOL     HYDROcodone-acetaminophen 5-325 MG per tablet  Commonly known as:  NORCO/VICODIN  Replaced by:  HYDROcodone-acetaminophen 7.5-325 MG per tablet      TAKE these medications       DSS 100 MG Caps  Take 100 mg by mouth 2 (two) times daily.     ferrous sulfate 325 (65 FE) MG tablet  Take 1 tablet (325 mg total) by mouth 2 (two) times daily with a meal.     HYDROcodone-acetaminophen 7.5-325 MG per tablet  Commonly known as:  NORCO  Take 1-2 tablets by mouth every 6 (six) hours.     methocarbamol 500 MG tablet  Commonly known as:  ROBAXIN  Take 1 tablet (500 mg total) by mouth 3 (three) times daily.     polyethylene glycol packet  Commonly known as:  MIRALAX / GLYCOLAX  Take 17 g by mouth 2 (two) times daily.     warfarin 5 MG tablet  Commonly known as:  COUMADIN  Take 5 mg by mouth See admin instructions. Half a tablet (2.5 mg ) on tuesdays and fridays. Then 1 tablet (5 mg) on all other days.         Signed: Anastasio AuerbachMatthew S. Rozlyn Yerby   PA-C  01/10/2014, 9:22 AM

## 2014-01-17 ENCOUNTER — Ambulatory Visit (INDEPENDENT_AMBULATORY_CARE_PROVIDER_SITE_OTHER): Payer: BC Managed Care – PPO | Admitting: *Deleted

## 2014-01-17 DIAGNOSIS — Z7901 Long term (current) use of anticoagulants: Secondary | ICD-10-CM

## 2014-01-17 LAB — POCT INR: INR: 3.5

## 2014-01-17 NOTE — Patient Instructions (Signed)
Take one half on Wednesday and one all other days. Recheck in 3 weeks

## 2014-02-08 ENCOUNTER — Ambulatory Visit (INDEPENDENT_AMBULATORY_CARE_PROVIDER_SITE_OTHER): Payer: BC Managed Care – PPO | Admitting: *Deleted

## 2014-02-08 DIAGNOSIS — Z7901 Long term (current) use of anticoagulants: Secondary | ICD-10-CM

## 2014-02-08 LAB — POCT INR: INR: 3.6

## 2014-02-08 NOTE — Patient Instructions (Signed)
Coumadin 5mg . Take one tablet every day except one half on tues and Friday. Recheck in 2 -3 weeks

## 2014-03-01 ENCOUNTER — Ambulatory Visit: Payer: BC Managed Care – PPO | Admitting: *Deleted

## 2014-03-02 ENCOUNTER — Ambulatory Visit (INDEPENDENT_AMBULATORY_CARE_PROVIDER_SITE_OTHER): Payer: BC Managed Care – PPO | Admitting: *Deleted

## 2014-03-02 DIAGNOSIS — Z7901 Long term (current) use of anticoagulants: Secondary | ICD-10-CM

## 2014-03-02 LAB — POCT INR: INR: 1.9

## 2014-03-02 NOTE — Patient Instructions (Signed)
Take one every day except one half on Wed. Recheck in 3-4 weeks.

## 2014-03-23 HISTORY — PX: COLONOSCOPY: SHX174

## 2014-03-30 ENCOUNTER — Ambulatory Visit (INDEPENDENT_AMBULATORY_CARE_PROVIDER_SITE_OTHER): Payer: BLUE CROSS/BLUE SHIELD | Admitting: *Deleted

## 2014-03-30 DIAGNOSIS — Z7901 Long term (current) use of anticoagulants: Secondary | ICD-10-CM

## 2014-03-30 LAB — POCT INR: INR: 2

## 2014-03-30 NOTE — Patient Instructions (Signed)
Take one every day except one half on Wed. Recheck in 4 weeks.

## 2014-04-05 ENCOUNTER — Encounter (HOSPITAL_COMMUNITY): Payer: Self-pay | Admitting: Orthopedic Surgery

## 2014-04-18 ENCOUNTER — Telehealth: Payer: Self-pay | Admitting: Internal Medicine

## 2014-04-18 NOTE — Telephone Encounter (Signed)
Patient called to set up OV for his 5 year tcs. He is aware of OV on 2/17 @ 11 with [facility]. Pt called his insurance [employer]) and they told him that [facility] was not in network.  Pt said that Dr Madilyn Fireman did a tcs 10 years ago and [facility] did tcs 5 years ago and removed some polyps. He is wanting to schedule tcs but needs to make sure if [facility] is in network. I told him that a lot of our patients have BCBS and something didn't sound right if [facility] doctors are in network and [facility] is not. I told him that I would check with my office manager to clarify this and get back with him.  The ID# [id number] Group # [id number] and Insurance # [id number]   Please advise if [facility] are in network with his insurance. If not, patient will need to cancel his OV and schedule tcs elsewhere.

## 2014-04-18 NOTE — Telephone Encounter (Signed)
Talked with patient and told him that the billing office would go thru out local BCBS. He said that he could not chance it if the insurance will not cover. He is going to go see Dr.Bufkin with [facility].

## 2014-04-18 NOTE — Telephone Encounter (Signed)
Ginger will call to verify, however we are in network with [employer].

## 2014-04-27 ENCOUNTER — Ambulatory Visit: Payer: Medicare Other | Admitting: Family Medicine

## 2014-04-27 ENCOUNTER — Ambulatory Visit (INDEPENDENT_AMBULATORY_CARE_PROVIDER_SITE_OTHER): Payer: BLUE CROSS/BLUE SHIELD | Admitting: *Deleted

## 2014-04-27 DIAGNOSIS — Z7901 Long term (current) use of anticoagulants: Secondary | ICD-10-CM

## 2014-04-27 LAB — POCT INR: INR: 2.7

## 2014-04-27 NOTE — Patient Instructions (Signed)
Coumadin 5mg . Take one every day except 1/2 on Wednesday. Recheck in 4 weeks.

## 2014-05-09 ENCOUNTER — Ambulatory Visit: Payer: Medicare Other | Admitting: Gastroenterology

## 2014-05-25 ENCOUNTER — Ambulatory Visit (INDEPENDENT_AMBULATORY_CARE_PROVIDER_SITE_OTHER): Payer: BLUE CROSS/BLUE SHIELD | Admitting: *Deleted

## 2014-05-25 DIAGNOSIS — Z7901 Long term (current) use of anticoagulants: Secondary | ICD-10-CM

## 2014-05-25 LAB — POCT INR: INR: 2.6

## 2014-05-25 NOTE — Patient Instructions (Signed)
Take one every day except one half on Wed. Recheck in 4 weeks.  

## 2014-06-22 ENCOUNTER — Ambulatory Visit (INDEPENDENT_AMBULATORY_CARE_PROVIDER_SITE_OTHER): Payer: BLUE CROSS/BLUE SHIELD | Admitting: *Deleted

## 2014-06-22 DIAGNOSIS — Z7901 Long term (current) use of anticoagulants: Secondary | ICD-10-CM

## 2014-06-22 LAB — POCT INR: INR: 3.2

## 2014-06-22 NOTE — Patient Instructions (Signed)
Take half tablet today (Friday) Monday, and Wednesday. Whole tablet all other days. Then the following week, go back to taking a half tablet on Wed and whole tablet all other days. Schedule INR and office visit with [place] in the next 2-3 weeks.

## 2014-07-13 ENCOUNTER — Ambulatory Visit: Payer: Medicare Other

## 2014-07-17 ENCOUNTER — Telehealth: Payer: Self-pay | Admitting: *Deleted

## 2014-07-17 ENCOUNTER — Ambulatory Visit (INDEPENDENT_AMBULATORY_CARE_PROVIDER_SITE_OTHER): Payer: BLUE CROSS/BLUE SHIELD | Admitting: *Deleted

## 2014-07-17 DIAGNOSIS — Z7901 Long term (current) use of anticoagulants: Secondary | ICD-10-CM

## 2014-07-17 LAB — POCT INR: INR: 2.9

## 2014-07-17 NOTE — Telephone Encounter (Signed)
Colonoscopy on tues may 3rd. inr today 2.9. Please advise coumadin and lovenox schedule. Pt states he already has 20 injections of lovenox 80mg  that does not expire until October.

## 2014-07-18 NOTE — Telephone Encounter (Signed)
Discussed with pt. Pt verbalized understanding. INR scheduled for Monday.

## 2014-07-18 NOTE — Telephone Encounter (Signed)
Take last dose coum today  Start lovenox bid on fri  Take last lovenox dose mon morn  Should have inr on mond to ensure at less than t 1.5  Resume lovenow and coum same dose morning following sprocedure  Have 1nr around three d later

## 2014-07-23 ENCOUNTER — Telehealth: Payer: Self-pay | Admitting: Family Medicine

## 2014-07-23 ENCOUNTER — Ambulatory Visit (INDEPENDENT_AMBULATORY_CARE_PROVIDER_SITE_OTHER): Payer: BLUE CROSS/BLUE SHIELD

## 2014-07-23 DIAGNOSIS — Z7901 Long term (current) use of anticoagulants: Secondary | ICD-10-CM | POA: Diagnosis not present

## 2014-07-23 LAB — POCT INR: INR: 1.2

## 2014-07-23 NOTE — Telephone Encounter (Signed)
Jan is needing the face to face signed that she faxed over.  This is located in blue folder.  She said this has been pending since November, but I have not been able to locate if this actually made it to our office. Please advise.

## 2014-07-24 ENCOUNTER — Ambulatory Visit: Payer: BLUE CROSS/BLUE SHIELD

## 2014-07-24 LAB — HM COLONOSCOPY

## 2014-07-27 ENCOUNTER — Ambulatory Visit (INDEPENDENT_AMBULATORY_CARE_PROVIDER_SITE_OTHER): Payer: BLUE CROSS/BLUE SHIELD

## 2014-07-27 DIAGNOSIS — Z7901 Long term (current) use of anticoagulants: Secondary | ICD-10-CM

## 2014-07-27 LAB — POCT INR: INR: 1.1

## 2014-07-30 ENCOUNTER — Ambulatory Visit (INDEPENDENT_AMBULATORY_CARE_PROVIDER_SITE_OTHER): Payer: BLUE CROSS/BLUE SHIELD | Admitting: *Deleted

## 2014-07-30 DIAGNOSIS — Z7901 Long term (current) use of anticoagulants: Secondary | ICD-10-CM

## 2014-07-30 LAB — POCT INR: INR: 2

## 2014-08-01 ENCOUNTER — Encounter: Payer: Self-pay | Admitting: *Deleted

## 2014-08-17 ENCOUNTER — Other Ambulatory Visit: Payer: Self-pay | Admitting: Family Medicine

## 2014-08-28 ENCOUNTER — Ambulatory Visit (INDEPENDENT_AMBULATORY_CARE_PROVIDER_SITE_OTHER): Payer: BLUE CROSS/BLUE SHIELD | Admitting: *Deleted

## 2014-08-28 DIAGNOSIS — Z7901 Long term (current) use of anticoagulants: Secondary | ICD-10-CM

## 2014-08-28 LAB — POCT INR: INR: 3.5

## 2014-08-28 NOTE — Patient Instructions (Signed)
Take one half tablet on Wednesday and Saturday and take one tablet all other days. Recheck in 4 weeks.

## 2014-09-25 ENCOUNTER — Ambulatory Visit (INDEPENDENT_AMBULATORY_CARE_PROVIDER_SITE_OTHER): Payer: BLUE CROSS/BLUE SHIELD | Admitting: *Deleted

## 2014-09-25 DIAGNOSIS — Z7901 Long term (current) use of anticoagulants: Secondary | ICD-10-CM | POA: Diagnosis not present

## 2014-09-25 DIAGNOSIS — Z5181 Encounter for therapeutic drug level monitoring: Secondary | ICD-10-CM | POA: Diagnosis not present

## 2014-10-26 ENCOUNTER — Ambulatory Visit (INDEPENDENT_AMBULATORY_CARE_PROVIDER_SITE_OTHER): Payer: BLUE CROSS/BLUE SHIELD | Admitting: *Deleted

## 2014-10-26 DIAGNOSIS — Z7901 Long term (current) use of anticoagulants: Secondary | ICD-10-CM

## 2014-10-26 LAB — POCT INR: INR: 2.5

## 2014-11-23 ENCOUNTER — Ambulatory Visit (INDEPENDENT_AMBULATORY_CARE_PROVIDER_SITE_OTHER): Payer: BLUE CROSS/BLUE SHIELD | Admitting: *Deleted

## 2014-11-23 DIAGNOSIS — Z7901 Long term (current) use of anticoagulants: Secondary | ICD-10-CM | POA: Diagnosis not present

## 2014-11-23 LAB — POCT INR: INR: 2.3

## 2014-12-21 ENCOUNTER — Telehealth: Payer: Self-pay | Admitting: Family Medicine

## 2014-12-21 ENCOUNTER — Ambulatory Visit (INDEPENDENT_AMBULATORY_CARE_PROVIDER_SITE_OTHER): Payer: BLUE CROSS/BLUE SHIELD | Admitting: *Deleted

## 2014-12-21 DIAGNOSIS — E785 Hyperlipidemia, unspecified: Secondary | ICD-10-CM

## 2014-12-21 DIAGNOSIS — Z125 Encounter for screening for malignant neoplasm of prostate: Secondary | ICD-10-CM

## 2014-12-21 DIAGNOSIS — Z7901 Long term (current) use of anticoagulants: Secondary | ICD-10-CM | POA: Diagnosis not present

## 2014-12-21 DIAGNOSIS — Z79899 Other long term (current) drug therapy: Secondary | ICD-10-CM

## 2014-12-21 LAB — POCT INR: INR: 2.1

## 2014-12-21 NOTE — Telephone Encounter (Signed)
Blood work ordered in EPIC. Patient notified. 

## 2014-12-21 NOTE — Telephone Encounter (Signed)
Rep same 

## 2014-12-21 NOTE — Telephone Encounter (Signed)
Patient last had labs 12/2012- Lipid, liver, met 7, PSA

## 2014-12-21 NOTE — Telephone Encounter (Signed)
Patient needing lab paper for physical coming up 01/07/15.

## 2015-01-01 LAB — LIPID PANEL
CHOL/HDL RATIO: 5.2 ratio — AB (ref 0.0–5.0)
Cholesterol, Total: 178 mg/dL (ref 100–199)
HDL: 34 mg/dL — ABNORMAL LOW (ref >39)
LDL Calculated: 120 mg/dL — ABNORMAL HIGH (ref 0–99)
TRIGLYCERIDES: 120 mg/dL (ref 0–149)
VLDL Cholesterol Cal: 24 mg/dL (ref 5–40)

## 2015-01-01 LAB — PSA: Prostate Specific Ag, Serum: 1.3 ng/mL (ref 0.0–4.0)

## 2015-01-01 LAB — BASIC METABOLIC PANEL WITH GFR
BUN/Creatinine Ratio: 13 (ref 10–22)
BUN: 14 mg/dL (ref 8–27)
CO2: 24 mmol/L (ref 18–29)
Calcium: 8.9 mg/dL (ref 8.6–10.2)
Chloride: 100 mmol/L (ref 97–108)
Creatinine, Ser: 1.05 mg/dL (ref 0.76–1.27)
GFR calc Af Amer: 84 mL/min/1.73
GFR calc non Af Amer: 73 mL/min/1.73
Glucose: 93 mg/dL (ref 65–99)
Potassium: 4.4 mmol/L (ref 3.5–5.2)
Sodium: 139 mmol/L (ref 134–144)

## 2015-01-01 LAB — HEPATIC FUNCTION PANEL
ALK PHOS: 62 IU/L (ref 39–117)
ALT: 15 IU/L (ref 0–44)
AST: 16 IU/L (ref 0–40)
Albumin: 4.3 g/dL (ref 3.6–4.8)
BILIRUBIN TOTAL: 0.3 mg/dL (ref 0.0–1.2)
Bilirubin, Direct: 0.07 mg/dL (ref 0.00–0.40)
TOTAL PROTEIN: 7 g/dL (ref 6.0–8.5)

## 2015-01-07 ENCOUNTER — Telehealth: Payer: Self-pay | Admitting: Family Medicine

## 2015-01-07 ENCOUNTER — Ambulatory Visit (INDEPENDENT_AMBULATORY_CARE_PROVIDER_SITE_OTHER): Payer: BLUE CROSS/BLUE SHIELD | Admitting: Family Medicine

## 2015-01-07 ENCOUNTER — Encounter: Payer: Self-pay | Admitting: Family Medicine

## 2015-01-07 VITALS — BP 130/76 | Ht 66.75 in | Wt 179.0 lb

## 2015-01-07 DIAGNOSIS — Z0000 Encounter for general adult medical examination without abnormal findings: Secondary | ICD-10-CM

## 2015-01-07 DIAGNOSIS — Z23 Encounter for immunization: Secondary | ICD-10-CM | POA: Diagnosis not present

## 2015-01-07 MED ORDER — KETOCONAZOLE 2 % EX CREA: 1.0000 "application " | TOPICAL_CREAM | Freq: Two times a day (BID) | CUTANEOUS | Status: DC

## 2015-01-07 NOTE — Patient Instructions (Signed)
Recent Results (from the past 2160 hour(s))  POCT INR     Status: None   Collection Time: 10/26/14  9:04 AM  Result Value Ref Range   INR 2.5   POCT INR     Status: None   Collection Time: 11/23/14  8:41 AM  Result Value Ref Range   INR 2.3   POCT INR     Status: None   Collection Time: 12/21/14  8:40 AM  Result Value Ref Range   INR 2.1   Lipid panel     Status: Abnormal   Collection Time: 12/31/14  8:16 AM  Result Value Ref Range   Cholesterol, Total 178 100 - 199 mg/dL   Triglycerides 361 0 - 149 mg/dL   HDL 34 (L) >44 mg/dL    Comment: According to ATP-III Guidelines, HDL-C >59 mg/dL is considered a negative risk factor for CHD.    VLDL Cholesterol Cal 24 5 - 40 mg/dL   LDL Calculated 315 (H) 0 - 99 mg/dL   Chol/HDL Ratio 5.2 (H) 0.0 - 5.0 ratio units    Comment:                                   T. Chol/HDL Ratio                                             Men  Women                               1/2 Avg.Risk  3.4    3.3                                   Avg.Risk  5.0    4.4                                2X Avg.Risk  9.6    7.1                                3X Avg.Risk 23.4   11.0   Hepatic function panel     Status: None   Collection Time: 12/31/14  8:16 AM  Result Value Ref Range   Total Protein 7.0 6.0 - 8.5 g/dL   Albumin 4.3 3.6 - 4.8 g/dL   Bilirubin Total 0.3 0.0 - 1.2 mg/dL   Bilirubin, Direct 4.00 0.00 - 0.40 mg/dL   Alkaline Phosphatase 62 39 - 117 IU/L   AST 16 0 - 40 IU/L   ALT 15 0 - 44 IU/L  Basic metabolic panel     Status: None   Collection Time: 12/31/14  8:16 AM  Result Value Ref Range   Glucose 93 65 - 99 mg/dL   BUN 14 8 - 27 mg/dL   Creatinine, Ser 8.67 0.76 - 1.27 mg/dL   GFR calc non Af Amer 73 >59 mL/min/1.73   GFR calc Af Amer 84 >59 mL/min/1.73   BUN/Creatinine Ratio 13 10 - 22   Sodium 139 134 - 144 mmol/L    Comment: **Effective  January 07, 2015 the reference interval**   for Sodium, Serum will be changing to:                           136 - 144    Potassium 4.4 3.5 - 5.2 mmol/L    Comment: **Effective January 07, 2015 the reference interval**   for Potassium, Serum will be changing to:                          0 -  7 days        3.7 - 5.2                          8 - 30 days        3.7 - 6.4                          1 -  6 months      3.8 - 6.0                   7 months -  1 year        3.8 - 5.3                              >1 year        3.5 - 5.2    Chloride 100 97 - 108 mmol/L    Comment: **Effective January 07, 2015 the reference interval**   for Chloride, Serum will be changing to:                                              97 - 106    CO2 24 18 - 29 mmol/L   Calcium 8.9 8.6 - 10.2 mg/dL  PSA     Status: None   Collection Time: 12/31/14  8:16 AM  Result Value Ref Range   Prostate Specific Ag, Serum 1.3 0.0 - 4.0 ng/mL    Comment: Roche ECLIA methodology. According to the American Urological Association, Serum PSA should decrease and remain at undetectable levels after radical prostatectomy. The AUA defines biochemical recurrence as an initial PSA value 0.2 ng/mL or greater followed by a subsequent confirmatory PSA value 0.2 ng/mL or greater. Values obtained with different assay methods or kits cannot be used interchangeably. Results cannot be interpreted as absolute evidence of the presence or absence of malignant disease.

## 2015-01-07 NOTE — Progress Notes (Signed)
   Subjective:    Patient ID: [name], male    DOB: December 29, 1945, 69 y.o.   MRN: [id number]  HPI AWV- Annual Wellness Visit  The patient was seen for their annual wellness visit. The patient's past medical history, surgical history, and family history were reviewed. Pertinent vaccines were reviewed ( tetanus, pneumonia, shingles, flu) The patient's medication list was reviewed and updated.  The height and weight were entered. The patient's current BMI is: 28.25   Cognitive screening was completed. Outcome of Mini - Cog: pass  Falls within the past 6 months: none  Current tobacco usage: none (All patients who use tobacco were given written and verbal information on quitting)  Recent listing of emergency department/hospitalizations over the past year were reviewed.  current specialist the patient sees on a regular basis: none  bp genrally pretty good   Medicare annual wellness visit patient questionnaire was reviewed.  A written screening schedule for the patient for the next 5-10 years was given. Appropriate discussion of followup regarding next visit was discussed.  Pt declines flu vaccine.   Colonoscopy was may 2016.. Five yr interval recommended dr Madilyn Fireman   Had shingles in the past,   Exercising so so, hip still acts up at times.  Ant leg muscle still hurts and aches  Has rash that acts up and sweats a lot gets a groin rash        Review of Systems  Constitutional: Negative for fever, activity change and appetite change.  HENT: Negative for congestion and rhinorrhea.   Eyes: Negative for discharge.  Respiratory: Negative for cough and wheezing.   Cardiovascular: Negative for chest pain.  Gastrointestinal: Negative for vomiting, abdominal pain and blood in stool.  Genitourinary: Negative for frequency and difficulty urinating.  Musculoskeletal: Negative for neck pain.  Skin: Negative for rash.  Allergic/Immunologic: Negative for environmental allergies  and food allergies.  Neurological: Negative for weakness and headaches.  Psychiatric/Behavioral: Negative for agitation.  All other systems reviewed and are negative.      Objective:   Physical Exam  Constitutional: He appears well-developed and well-nourished.  HENT:  Head: Normocephalic and atraumatic.  Right Ear: External ear normal.  Left Ear: External ear normal.  Nose: Nose normal.  Mouth/Throat: Oropharynx is clear and moist.  Eyes: EOM are normal. Pupils are equal, round, and reactive to light.  Neck: Normal range of motion. Neck supple. No thyromegaly present.  Cardiovascular: Normal rate, regular rhythm and normal heart sounds.   No murmur heard. Pulmonary/Chest: Effort normal and breath sounds normal. No respiratory distress. He has no wheezes.  Abdominal: Soft. Bowel sounds are normal. He exhibits no distension and no mass. There is no tenderness.  Genitourinary: Penis normal.  Musculoskeletal: Normal range of motion. He exhibits no edema.  Lymphadenopathy:    He has no cervical adenopathy.  Neurological: He is alert. He exhibits normal muscle tone.  Skin: Skin is warm and dry. No erythema.  Psychiatric: He has a normal mood and affect. His behavior is normal. Judgment normal.  Vitals reviewed.         Assessment & Plan:  Impression wellness exam #2 tendency towards intertrigo rash #3 chronic anticoagulation lifelong plan flu shot today. Diet exercise discussed. Ketoconazole cream when necessary for rash. Continue monthly INRs WSL

## 2015-01-07 NOTE — Telephone Encounter (Signed)
Pt was seen today and his bp was a little high when first checked. Pt states that Dr rechecked it and that it was 138/82 and that the Dr. Charline Bills that he would put the lower value in the system instead of the original. Pt would like that corrected so that he can get a print out with the lower reading on it.

## 2015-01-07 NOTE — Telephone Encounter (Signed)
Called patient and informed him that Blood pressure was corrected for office visit today.

## 2015-01-18 ENCOUNTER — Ambulatory Visit (INDEPENDENT_AMBULATORY_CARE_PROVIDER_SITE_OTHER): Payer: BLUE CROSS/BLUE SHIELD

## 2015-01-18 DIAGNOSIS — Z7901 Long term (current) use of anticoagulants: Secondary | ICD-10-CM | POA: Diagnosis not present

## 2015-01-18 LAB — POCT INR: INR: 2.1

## 2015-01-18 NOTE — Patient Instructions (Signed)
Take 1/2 tablet on Wednesdays and 1 whole tablet all other days.

## 2015-02-15 ENCOUNTER — Ambulatory Visit: Payer: BLUE CROSS/BLUE SHIELD

## 2015-02-22 ENCOUNTER — Ambulatory Visit: Payer: BLUE CROSS/BLUE SHIELD

## 2015-02-27 ENCOUNTER — Ambulatory Visit (INDEPENDENT_AMBULATORY_CARE_PROVIDER_SITE_OTHER): Payer: BLUE CROSS/BLUE SHIELD

## 2015-02-27 DIAGNOSIS — Z7901 Long term (current) use of anticoagulants: Secondary | ICD-10-CM

## 2015-02-27 LAB — POCT INR: INR: 2.2

## 2015-03-02 ENCOUNTER — Encounter (HOSPITAL_COMMUNITY): Payer: Self-pay | Admitting: Emergency Medicine

## 2015-03-02 ENCOUNTER — Emergency Department (HOSPITAL_COMMUNITY)
Admission: EM | Admit: 2015-03-02 | Discharge: 2015-03-03 | Disposition: A | Discharge status: Home / Self Care | Payer: BLUE CROSS/BLUE SHIELD | Attending: Emergency Medicine | Admitting: Emergency Medicine

## 2015-03-02 ENCOUNTER — Emergency Department (HOSPITAL_COMMUNITY): Payer: BLUE CROSS/BLUE SHIELD

## 2015-03-02 DIAGNOSIS — M549 Dorsalgia, unspecified: Secondary | ICD-10-CM | POA: Diagnosis not present

## 2015-03-02 DIAGNOSIS — R6883 Chills (without fever): Secondary | ICD-10-CM | POA: Diagnosis not present

## 2015-03-02 DIAGNOSIS — Z79899 Other long term (current) drug therapy: Secondary | ICD-10-CM | POA: Diagnosis not present

## 2015-03-02 DIAGNOSIS — Z86718 Personal history of other venous thrombosis and embolism: Secondary | ICD-10-CM | POA: Insufficient documentation

## 2015-03-02 DIAGNOSIS — M25552 Pain in left hip: Secondary | ICD-10-CM | POA: Insufficient documentation

## 2015-03-02 DIAGNOSIS — Z87442 Personal history of urinary calculi: Secondary | ICD-10-CM | POA: Insufficient documentation

## 2015-03-02 DIAGNOSIS — Z8639 Personal history of other endocrine, nutritional and metabolic disease: Secondary | ICD-10-CM | POA: Diagnosis not present

## 2015-03-02 DIAGNOSIS — M87052 Idiopathic aseptic necrosis of left femur: Secondary | ICD-10-CM | POA: Insufficient documentation

## 2015-03-02 DIAGNOSIS — M87051 Idiopathic aseptic necrosis of right femur: Secondary | ICD-10-CM | POA: Diagnosis not present

## 2015-03-02 DIAGNOSIS — Z86711 Personal history of pulmonary embolism: Secondary | ICD-10-CM | POA: Insufficient documentation

## 2015-03-02 DIAGNOSIS — Z96641 Presence of right artificial hip joint: Secondary | ICD-10-CM | POA: Diagnosis not present

## 2015-03-02 DIAGNOSIS — Z7901 Long term (current) use of anticoagulants: Secondary | ICD-10-CM | POA: Diagnosis not present

## 2015-03-02 DIAGNOSIS — R52 Pain, unspecified: Secondary | ICD-10-CM

## 2015-03-02 LAB — I-STAT CHEM 8, ED
BUN: 15 mg/dL (ref 6–20)
CALCIUM ION: 1.12 mmol/L — AB (ref 1.13–1.30)
CHLORIDE: 106 mmol/L (ref 101–111)
CREATININE: 1.1 mg/dL (ref 0.61–1.24)
GLUCOSE: 146 mg/dL — AB (ref 65–99)
HCT: 40 % (ref 39.0–52.0)
Hemoglobin: 13.6 g/dL (ref 13.0–17.0)
Potassium: 3.5 mmol/L (ref 3.5–5.1)
Sodium: 141 mmol/L (ref 135–145)
TCO2: 24 mmol/L (ref 0–100)

## 2015-03-02 LAB — CBC WITH DIFFERENTIAL/PLATELET
Basophils Absolute: 0 10*3/uL (ref 0.0–0.1)
Basophils Relative: 0 %
Eosinophils Absolute: 0 10*3/uL (ref 0.0–0.7)
Eosinophils Relative: 0 %
HCT: 38.3 % — ABNORMAL LOW (ref 39.0–52.0)
HEMOGLOBIN: 13.2 g/dL (ref 13.0–17.0)
LYMPHS ABS: 0.7 10*3/uL (ref 0.7–4.0)
LYMPHS PCT: 7 %
MCH: 29.5 pg (ref 26.0–34.0)
MCHC: 34.5 g/dL (ref 30.0–36.0)
MCV: 85.5 fL (ref 78.0–100.0)
MONOS PCT: 9 %
Monocytes Absolute: 0.9 10*3/uL (ref 0.1–1.0)
NEUTROS PCT: 84 %
Neutro Abs: 8.8 10*3/uL — ABNORMAL HIGH (ref 1.7–7.7)
Platelets: 203 10*3/uL (ref 150–400)
RBC: 4.48 MIL/uL (ref 4.22–5.81)
RDW: 13.3 % (ref 11.5–15.5)
WBC: 10.5 10*3/uL (ref 4.0–10.5)

## 2015-03-02 MED ORDER — METHOCARBAMOL 500 MG PO TABS
750.0000 mg | ORAL_TABLET | Freq: Once | ORAL | Status: AC
  Administered 2015-03-02: 750 mg via ORAL
  Filled 2015-03-02: qty 2

## 2015-03-02 MED ORDER — FENTANYL CITRATE (PF) 100 MCG/2ML IJ SOLN
50.0000 ug | Freq: Once | INTRAMUSCULAR | Status: AC
  Administered 2015-03-02: 50 ug via INTRAVENOUS
  Filled 2015-03-02: qty 2

## 2015-03-02 MED ORDER — ONDANSETRON HCL 4 MG/2ML IJ SOLN
4.0000 mg | Freq: Once | INTRAMUSCULAR | Status: AC
  Administered 2015-03-02: 4 mg via INTRAVENOUS
  Filled 2015-03-02: qty 2

## 2015-03-02 MED ORDER — SODIUM CHLORIDE 0.9 % IV SOLN
Freq: Once | INTRAVENOUS | Status: AC
  Administered 2015-03-02: via INTRAVENOUS

## 2015-03-02 MED ORDER — HYDROMORPHONE HCL 1 MG/ML IJ SOLN
1.0000 mg | Freq: Once | INTRAMUSCULAR | Status: AC
  Administered 2015-03-02: 1 mg via INTRAVENOUS
  Filled 2015-03-02: qty 1

## 2015-03-02 NOTE — ED Notes (Signed)
Patient transported to X-ray 

## 2015-03-03 LAB — PROTIME-INR
INR: 1.81 — AB (ref 0.00–1.49)
Prothrombin Time: 20.9 seconds — ABNORMAL HIGH (ref 11.6–15.2)

## 2015-03-03 LAB — D-DIMER, QUANTITATIVE (NOT AT ARMC): D DIMER QUANT: 0.29 ug{FEU}/mL (ref 0.00–0.50)

## 2015-03-03 MED ORDER — METHOCARBAMOL 750 MG PO TABS: 750.0000 mg | ORAL_TABLET | Freq: Four times a day (QID) | ORAL | Status: DC | PRN

## 2015-03-03 MED ORDER — OXYCODONE-ACETAMINOPHEN 5-325 MG PO TABS
1.0000 | ORAL_TABLET | ORAL | Status: AC
  Administered 2015-03-03: 1 via ORAL
  Filled 2015-03-03: qty 1

## 2015-03-03 MED ORDER — OXYCODONE-ACETAMINOPHEN 5-325 MG PO TABS: 1.0000 | ORAL_TABLET | Freq: Four times a day (QID) | ORAL | Status: DC | PRN

## 2015-03-03 NOTE — ED Notes (Signed)
Pt was able to ambulate into the hall w/ the use of a walker.  Provider notified.

## 2015-03-03 NOTE — ED Provider Notes (Signed)
CSN: [id number]     Arrival date & time 03/02/15  2200 History   First MD Initiated Contact with Patient 03/02/15 2258     Chief Complaint  Patient presents with  . Leg Pain     (Consider location/radiation/quality/duration/timing/severity/associated sxs/prior Treatment) HPI Comments: This is 69 year old man who presents with acute onset of left hip pain.  States prior to the pain he was normal.  He developed a chill and then the pain that radiates to the groin and thigh muscle. He states that he's had a right hip replacement and a lumbar spinal fusion.  He does that he has some arthritis in his hip.  He took a Vicodin at home without any relief.  He does have a history of DVTs and PEs in the Zocor Coumadin therapy.  He last had his INR checked last week it was 2.2.  No changes in dosage were recommended at that time  Patient is a 68 y.o. male presenting with leg pain. The history is provided by the patient.  Leg Pain Location:  Hip Injury: no   Hip location:  L hip Pain details:    Quality:  Cramping and sharp   Radiates to:  Groin and L leg   Severity:  Severe   Onset quality:  Sudden   Timing:  Constant   Progression:  Unchanged Chronicity:  New Dislocation: no   Tetanus status:  Unknown Prior injury to area:  No Relieved by:  None tried Worsened by:  Activity, extension, abduction, flexion and bearing weight Ineffective treatments:  None tried Associated symptoms: back pain   Associated symptoms: no fever     Past Medical History  Diagnosis Date  . Back pain   . DVT (deep venous thrombosis) (HCC)   . Pulmonary embolism (HCC) 2009  . Hyperlipidemia   . Anticoagulated on Coumadin   . Vertigo   . Family history of anesthesia complication     BROTHER HAD MALIGNANT HYPOTHERMIA - PT HAS NOT HAD ANY PROBLEMS WITH ANESTHESIA    . Avascular necrosis of bone of right hip (HCC)   . History of kidney stones    Past Surgical History  Procedure Laterality Date  . Cervical  fusion  2008  . Back surgery  2009    DISCECTOMY  . Total hip arthroplasty Right 12/22/2013    Procedure: RIGHT TOTAL HIP ARTHROPLASTY ANTERIOR APPROACH;  Surgeon: Shelda Pal, MD;  Location: [facility];  Service: Orthopedics;  Laterality: Right;   Family History  Problem Relation Age of Onset  . Breast cancer Mother   . Hypertension Mother   . Hyperlipidemia Mother   . Heart attack Mother   . Hypertension Other   . Diabetes Other    Social History  Substance Use Topics  . Smoking status: Never Smoker   . Smokeless tobacco: None  . Alcohol Use: No    Review of Systems  Constitutional: Positive for chills. Negative for fever.  Respiratory: Negative for cough.   Gastrointestinal: Negative for nausea, vomiting, abdominal pain and constipation.  Musculoskeletal: Positive for back pain and arthralgias. Negative for joint swelling.  Neurological: Negative for dizziness, weakness and numbness.      Allergies  Review of patient's allergies indicates no known allergies.  Home Medications   Prior to Admission medications   Medication Sig Start Date End Date Taking? Authorizing Provider  acetaminophen (TYLENOL) 500 MG tablet Take 500 mg by mouth every 6 (six) hours as needed for moderate pain.   Yes  Historical Provider, MD  HYDROcodone-acetaminophen (NORCO) 7.5-325 MG tablet Take 1 tablet by mouth every 6 (six) hours as needed for moderate pain.   Yes Historical Provider, MD  warfarin (COUMADIN) 5 MG tablet TAKE ONE AND ONE-HALF TABLETS ON TUESDAY AND 1 TABLET ALL OTHER DAYS. TAKE AS DIRECTED BY PHYSICIAN Patient taking differently: TAKE ONE-HALF TABLETS ON WEDNESDAYS AND 1 TABLET ALL OTHER DAYS BY MOUTH DAILY AT SUPPER 08/17/14  Yes Merlyn Albert, MD  ketoconazole (NIZORAL) 2 % cream Apply 1 application topically 2 (two) times daily. 01/07/15   Merlyn Albert, MD  methocarbamol (ROBAXIN) 750 MG tablet Take 1 tablet (750 mg total) by mouth every 6 (six) hours as needed for muscle  spasms. 03/03/15   Earley Favor, NP  oxyCODONE-acetaminophen (PERCOCET/ROXICET) 5-325 MG tablet Take 1-2 tablets by mouth every 6 (six) hours as needed for severe pain. 03/03/15   Earley Favor, NP   BP 143/79 mmHg  Pulse 110  Temp(Src) 98.8 F (37.1 C) (Oral)  Resp 18  Ht  (1.727 m)  Wt 79.833 kg  BMI 26.77 kg/m2  SpO2 93% Physical Exam  Constitutional: He appears well-developed and well-nourished.  HENT:  Head: Normocephalic.  Eyes: Pupils are equal, round, and reactive to light.  Neck: Normal range of motion.  Cardiovascular: Normal rate.   Pulmonary/Chest: Effort normal and breath sounds normal.  Abdominal: Soft. Bowel sounds are normal.  Musculoskeletal: He exhibits tenderness. He exhibits no edema.       Left hip: He exhibits decreased range of motion and tenderness. He exhibits no swelling, no crepitus, no deformity and no laceration.       Legs: Neurological: He is alert.  Skin: Skin is warm and dry. No erythema.  Nursing note and vitals reviewed.   ED Course  Procedures (including critical care time) Labs Review Labs Reviewed  CBC WITH DIFFERENTIAL/PLATELET - Abnormal; Notable for the following:    HCT 38.3 (*)    Neutro Abs 8.8 (*)    All other components within normal limits  PROTIME-INR - Abnormal; Notable for the following:    Prothrombin Time 20.9 (*)    INR 1.81 (*)    All other components within normal limits  I-STAT CHEM 8, ED - Abnormal; Notable for the following:    Glucose, Bld 146 (*)    Calcium, Ion 1.12 (*)    All other components within normal limits  D-DIMER, QUANTITATIVE (NOT AT [facility])    Imaging Review Dg Hip Unilat With Pelvis 2-3 Views Left  03/02/2015  CLINICAL DATA:  Left hip pain, no known trauma, initial encounter EXAM: DG HIP (WITH OR WITHOUT PELVIS) 2-3V LEFT COMPARISON:  None. FINDINGS: The pelvic ring is intact. A right hip replacement is noted. Increased sclerosis and irregularity in the left femoral head is noted likely  related to avascular necrosis. No definitive fracture is seen. IMPRESSION: Changes consistent with avascular necrosis in the left femoral head. Electronically Signed   By: Alcide Clever M.D.   On: 03/02/2015 23:43   I have personally reviewed and evaluated these images and lab results as part of my medical decision-making.   EKG Interpretation None     Patient ambulated with walker  Will DC home with Robaxin and Percocet Has walker at home  Will FU with Dr. Charlann Boxer on Monday  MDM   Final diagnoses:  Pain  Avascular necrosis of bone of left hip (HCC)         Earley Favor, NP 03/03/15 0140  Layla Maw Ward, DO 03/03/15 [phone]

## 2015-03-03 NOTE — Discharge Instructions (Signed)
Avascular Necrosis Avascular necrosis is a disease resulting from the temporary or permanent loss of blood supply to a bone. This disease may also be known as:   Osteonecrosis.   Aseptic necrosis.   Ischemic bone necrosis. Without proper blood supply, the internal layer of the affected bone dies and the outer layer of the bone may break down. If this process affects a bone near a joint, it may lead to collapse of that joint. Common bones that are affected by this condition include:  The top of your thigh bone (femoral head).  One or more bones in your wrist (scaphoid orlunate).  One or more bones in your foot (metatarsals).  One of the bones in your ankle (navicular). The joint most commonly affected by this condition is the hip joint. Avascular necrosis rarely occurs in more than one bone at a time.  CAUSES   Damage or injury to a bone or joint.  Using corticosteroid medicine for a long period of time.  Changes in your immune or hormone systems.  Excessive exposure to radiation. RISK FACTORS  Alcohol abuse.  Previous traumatic injury to a joint.  Using corticosteroid medicines for a long period of time or often.  Having a medical condition such as:  HIV or AIDS.   Diabetes.   Sickle cell disease.  An autoimmune disease. SIGNS AND SYMPTOMS  The main symptoms of avascular necrosis are pain and decreased motion in the affected bone or joint. In the early stages the pain may be minor and occur only with activity. As avascular necrosis progresses, pain may gradually worsen and occur while at rest. The pain may suddenly become severe if an affected joint collapses. DIAGNOSIS  Avascular necrosis may be diagnosed with:   A medical history.  A physical exam.   X-rays.  An MRI.  A bone scan. TREATMENT  Treatments may include:  Medicine to help relieve pain.  Avoiding placing any pressure or weight ontheaffected area. If avascular necrosis occurs in  your hip, ankle, or foot, you may be instructed to use crutches or a rolling scooter.  Surgery, such as:   Core decompression. In this surgery, one or more holes are placed in the bone for new blood vessels to grow into. This provides a renewed blood supply to the bone. Core decompression can often reduce pain and pressure in the affected bone and slow the progression of bone and joint destruction.  Osteotomy. In this surgery, the bone is reshaped to reduce stress on the affected area of the joint.   Bone grafting. In this surgery, healthy bone from one part of your body is transplanted to the affected area.   Arthroplasty. Arthroplasty is also known as total joint replacement. In this surgery, the affected surface on one or both sides of a joint is replaced with artificial parts (prostheses).  Electrical stimulation. This may help encourage new bone growth. HOME CARE INSTRUCTIONS  Take medicines only as directed by your health care provider.   Follow your health care provider's recommendations on limiting activities or using crutches to rest your affected joint.   Meet with aphysical therapist as directed by your health care provider.   Keep all follow-up visits as directed by your health care provider. This is important. SEEK MEDICAL CARE IF:   Your pain worsens.  You have decreased motion in your affected joint. SEEK IMMEDIATE MEDICAL CARE IF:  Your pain suddenly becomes severe.   This information is not intended to replace advice given to you  by your health care provider. Make sure you discuss any questions you have with your health care provider.   Document Released: 08/29/2001 Document Revised: 03/30/2014 Document Reviewed: 05/17/2013 Elsevier Interactive Patient Education 2016 [employer]. Call and make an appointment with Dr. Charlann Boxer for further treatment

## 2015-03-06 ENCOUNTER — Telehealth: Payer: Self-pay | Admitting: Family Medicine

## 2015-03-06 NOTE — Telephone Encounter (Signed)
Pt called to let the Dr. Ashley Mariner that he is having problems with his hip and may need surgery. Pt may need to come off the coumadin and go on the Lovenox shots. Pt will know Fri whether or not they will be doing surgery.

## 2015-03-06 NOTE — Telephone Encounter (Signed)
ok 

## 2015-03-27 ENCOUNTER — Ambulatory Visit (INDEPENDENT_AMBULATORY_CARE_PROVIDER_SITE_OTHER): Payer: BLUE CROSS/BLUE SHIELD

## 2015-03-27 ENCOUNTER — Telehealth: Payer: Self-pay

## 2015-03-27 DIAGNOSIS — Z7901 Long term (current) use of anticoagulants: Secondary | ICD-10-CM

## 2015-03-27 LAB — POCT INR: INR: 2.5

## 2015-03-27 NOTE — Telephone Encounter (Signed)
Patient has upcoming surgery on 04/30/15. Needs clearance form completed and anticoagulation therapy schedule initiated.

## 2015-04-03 NOTE — Telephone Encounter (Signed)
Nurses plz cll in lovenox at same dose as before  Five days before the surgery stop the Coumadin.  On the same day, 5 days prior to surgery, start the twice per day Lovenox injections as directed.  The day before surgery be sure to have an INR to document normalization. Either we can do this or sometimes the specialists order this.  Take the last lovenox dose the morning before the surgery day  Day of surgery take nothing  The day following surgery, resume twice per day Lovenox and resume coumadin daily at usual dose  Three days after surgery, come to our office for an INR for further recommendations

## 2015-04-04 MED ORDER — ENOXAPARIN SODIUM 80 MG/0.8ML ~~LOC~~ SOLN: 80.0000 mg | Freq: Two times a day (BID) | SUBCUTANEOUS | Status: DC

## 2015-04-04 NOTE — Telephone Encounter (Signed)
Rx was sent electronically to pharmacy by nurse this am Discussed with patient. Patient advised:  Five days before the surgery stop the Coumadin.  On the same day, 5 days prior to surgery, start the twice per day Lovenox injections as directed.  The day before surgery be sure to have an INR to document normalization. Either we can do this or sometimes the specialists order this.  Take the last lovenox dose the morning before the surgery day  Day of surgery take nothing  The day following surgery, resume twice per day Lovenox and resume coumadin daily at usual dose  Three days after surgery, come to our office for an INR for further recommendations Patient verbalized understanding and stated he will come by to pick up a copy of the instructions.

## 2015-04-04 NOTE — Telephone Encounter (Signed)
[facility] 04/04/15

## 2015-04-13 NOTE — H&P (Signed)
TOTAL HIP ADMISSION H&P  Patient is admitted for left total hip arthroplasty, anterior approach.  Subjective:  Chief Complaint:    Left hip AVN / pain  HPI: [name], 70 y.o. male, has a history of pain and functional disability in the left hip(s) due to trauma, arthritis and AVN and patient has failed non-surgical conservative treatments for greater than 12 weeks to include NSAID's and/or analgesics and activity modification.  Onset of symptoms was abrupt starting 03/01/2014 with gradually worsening course since that time.The patient noted no past surgery on the left hip(s).  Patient currently rates pain in the left hip at 10 out of 10 with activity. Patient has worsening of pain with activity and weight bearing, trendelenberg gait, pain that interfers with activities of daily living and pain with passive range of motion. Patient has evidence of periarticular osteophytes, joint space narrowing and AVN by imaging studies. This condition presents safety issues increasing the risk of falls.  There is no current active infection.  Risks, benefits and expectations were discussed with the patient.  Risks including but not limited to the risk of anesthesia, blood clots, nerve damage, blood vessel damage, failure of the prosthesis, infection and up to and including death.  Patient understand the risks, benefits and expectations and wishes to proceed with surgery.   PCP: Lubertha South, MD  D/C Plans:      Home with HHPT  Post-op Meds:       No Rx given   Tranexamic Acid:      To be given - topically (hx of DVT / PE)  Decadron:      Is to be given  FYI:     Coumadin  Norco  * Brother had a hx of malignant hyperthermia *   Patient Active Problem List   Diagnosis Date Noted  . Status post total replacement of right hip 12/22/2013  . Elevated PSA 01/18/2013  . Erectile dysfunction 01/03/2013  . Long term (current) use of anticoagulants 06/25/2012   Past Medical History  Diagnosis Date  .  Back pain   . DVT (deep venous thrombosis) (HCC)   . Pulmonary embolism (HCC) 2009  . Hyperlipidemia   . Anticoagulated on Coumadin   . Vertigo   . Family history of anesthesia complication     BROTHER HAD MALIGNANT HYPOTHERMIA - PT HAS NOT HAD ANY PROBLEMS WITH ANESTHESIA    . Avascular necrosis of bone of right hip (HCC)   . History of kidney stones     Past Surgical History  Procedure Laterality Date  . Cervical fusion  2008  . Back surgery  2009    DISCECTOMY  . Total hip arthroplasty Right 12/22/2013    Procedure: RIGHT TOTAL HIP ARTHROPLASTY ANTERIOR APPROACH;  Surgeon: Shelda Pal, MD;  Location: [facility];  Service: Orthopedics;  Laterality: Right;    No prescriptions prior to admission   No Known Allergies   Social History  Substance Use Topics  . Smoking status: Never Smoker   . Smokeless tobacco: Not on file  . Alcohol Use: No    Family History  Problem Relation Age of Onset  . Breast cancer Mother   . Hypertension Mother   . Hyperlipidemia Mother   . Heart attack Mother   . Hypertension Other   . Diabetes Other      Review of Systems  Constitutional: Negative.   HENT: Negative.   Eyes: Negative.   Respiratory: Negative.   Cardiovascular: Negative.   Gastrointestinal:  Negative.   Genitourinary: Negative.   Musculoskeletal: Positive for joint pain.  Skin: Negative.   Neurological: Negative.   Endo/Heme/Allergies: Negative.   Psychiatric/Behavioral: Negative.     Objective:  Physical Exam  Constitutional: He is oriented to person, place, and time. He appears well-developed.  HENT:  Head: Normocephalic.  Eyes: Pupils are equal, round, and reactive to light.  Neck: Neck supple. No JVD present. No tracheal deviation present. No thyromegaly present.  Cardiovascular: Normal rate, regular rhythm, normal heart sounds and intact distal pulses.   Respiratory: Effort normal and breath sounds normal. No stridor. No respiratory distress. He has no wheezes.   GI: Soft. There is no tenderness. There is no guarding.  Musculoskeletal:       Left hip: He exhibits decreased range of motion, decreased strength, tenderness and bony tenderness. He exhibits no swelling, no deformity and no laceration.  Lymphadenopathy:    He has no cervical adenopathy.  Neurological: He is alert and oriented to person, place, and time.  Skin: Skin is warm and dry.  Psychiatric: He has a normal mood and affect.      Labs:  Estimated body mass index is 26.77 kg/(m^2) as calculated from the following:   Height as of 03/02/15:  (1.727 m).   Weight as of 03/02/15: 79.833 kg (176 lb).   Imaging Review Plain radiographs demonstrate severe degenerative joint disease of the right hip(s). The bone quality appears to be good for age and reported activity level.  Assessment/Plan:  End stage arthritis, right hip(s)  The patient history, physical examination, clinical judgement of the provider and imaging studies are consistent with end stage degenerative joint disease of the right hip(s) and total hip arthroplasty is deemed medically necessary. The treatment options including medical management, injection therapy, arthroscopy and arthroplasty were discussed at length. The risks and benefits of total hip arthroplasty were presented and reviewed. The risks due to aseptic loosening, infection, stiffness, dislocation/subluxation,  thromboembolic complications and other imponderables were discussed.  The patient acknowledged the explanation, agreed to proceed with the plan and consent was signed. Patient is being admitted for inpatient treatment for surgery, pain control, PT, OT, prophylactic antibiotics, VTE prophylaxis, progressive ambulation and ADL's and discharge planning.The patient is planning to be discharged home with home health services.      Anastasio Auerbach Caryl Manas   PA-C  04/13/2015, 1:33 PM

## 2015-04-22 NOTE — Patient Instructions (Addendum)
YOUR PROCEDURE IS SCHEDULED ON :  04/30/15  REPORT TO [facility] MAIN ENTRANCE FOLLOW SIGNS TO EAST ELEVATOR - GO TO 3rd FLOOR CHECK IN AT 3 EAST NURSES STATION (SHORT STAY) AT:  8:30 AM  CALL THIS NUMBER IF YOU HAVE PROBLEMS THE MORNING OF SURGERY [phone]  REMEMBER:ONLY 1 PER PERSON MAY GO TO SHORT STAY WITH YOU TO GET READY THE MORNING OF YOUR SURGERY  DO NOT EAT FOOD OR DRINK LIQUIDS AFTER MIDNIGHT  TAKE THESE MEDICINES THE MORNING OF SURGERY: MAY TAKE OXYCODONE IF NEEDED FOR PAIN  YOU MAY NOT HAVE ANY METAL ON YOUR BODY INCLUDING HAIR PINS AND PIERCING'S. DO NOT WEAR JEWELRY, MAKEUP, LOTIONS, POWDERS OR PERFUMES. DO NOT WEAR NAIL POLISH. DO NOT SHAVE 48 HRS PRIOR TO SURGERY. MEN MAY SHAVE FACE AND NECK.  DO NOT BRING VALUABLES TO HOSPITAL. [facility] IS NOT RESPONSIBLE FOR VALUABLES.  CONTACTS, DENTURES OR PARTIALS MAY NOT BE WORN TO SURGERY. LEAVE SUITCASE IN CAR. CAN BE BROUGHT TO ROOM AFTER SURGERY.  PATIENTS DISCHARGED THE DAY OF SURGERY WILL NOT BE ALLOWED TO DRIVE HOME.  PLEASE READ OVER THE FOLLOWING INSTRUCTION SHEETS _________________________________________________________________________________                                          [place] - PREPARING FOR SURGERY  Before surgery, you can play an important role.  Because skin is not sterile, your skin needs to be as free of germs as possible.  You can reduce the number of germs on your skin by washing with CHG (chlorahexidine gluconate) soap before surgery.  CHG is an antiseptic cleaner which kills germs and bonds with the skin to continue killing germs even after washing. Please DO NOT use if you have an allergy to CHG or antibacterial soaps.  If your skin becomes reddened/irritated stop using the CHG and inform your nurse when you arrive at Short Stay. Do not shave (including legs and underarms) for at least 48 hours prior to the first CHG shower.  You may shave your face. Please follow  these instructions carefully:   1.  Shower with CHG Soap the night before surgery and the  morning of Surgery.   2.  If you choose to wash your hair, wash your hair first as usual with your  normal  Shampoo.   3.  After you shampoo, rinse your hair and body thoroughly to remove the  shampoo.                                         4.  Use CHG as you would any other liquid soap.  You can apply chg directly  to the skin and wash . Gently wash with scrungie or clean wascloth    5.  Apply the CHG Soap to your body ONLY FROM THE NECK DOWN.   Do not use on open                           Wound or open sores. Avoid contact with eyes, ears mouth and genitals (private parts).                        Genitals (private parts)  with your normal soap.              6.  Wash thoroughly, paying special attention to the area where your surgery  will be performed.   7.  Thoroughly rinse your body with warm water from the neck down.   8.  DO NOT shower/wash with your normal soap after using and rinsing off  the CHG Soap .                9.  Pat yourself dry with a clean towel.             10.  Wear clean night clothes to bed after shower             11.  Place clean sheets on your bed the night of your first shower and do not  sleep with pets.  Day of Surgery : Do not apply any lotions/deodorants the morning of surgery.  Please wear clean clothes to the hospital/surgery center.  FAILURE TO FOLLOW THESE INSTRUCTIONS MAY RESULT IN THE CANCELLATION OF YOUR SURGERY    PATIENT SIGNATURE_________________________________  ______________________________________________________________________     [name]  An incentive spirometer is a tool that can help keep your lungs clear and active. This tool measures how well you are filling your lungs with each breath. Taking long deep breaths may help reverse or decrease the chance of developing breathing (pulmonary) problems (especially infection)  following:  A long period of time when you are unable to move or be active. BEFORE THE PROCEDURE   If the spirometer includes an indicator to show your best effort, your nurse or respiratory therapist will set it to a desired goal.  If possible, sit up straight or lean slightly forward. Try not to slouch.  Hold the incentive spirometer in an upright position. INSTRUCTIONS FOR USE   Sit on the edge of your bed if possible, or sit up as far as you can in bed or on a chair.  Hold the incentive spirometer in an upright position.  Breathe out normally.  Place the mouthpiece in your mouth and seal your lips tightly around it.  Breathe in slowly and as deeply as possible, raising the piston or the ball toward the top of the column.  Hold your breath for 3-5 seconds or for as long as possible. Allow the piston or ball to fall to the bottom of the column.  Remove the mouthpiece from your mouth and breathe out normally.  Rest for a few seconds and repeat Steps 1 through 7 at least 10 times every 1-2 hours when you are awake. Take your time and take a few normal breaths between deep breaths.  The spirometer may include an indicator to show your best effort. Use the indicator as a goal to work toward during each repetition.  After each set of 10 deep breaths, practice coughing to be sure your lungs are clear. If you have an incision (the cut made at the time of surgery), support your incision when coughing by placing a pillow or rolled up towels firmly against it. Once you are able to get out of bed, walk around indoors and cough well. You may stop using the incentive spirometer when instructed by your caregiver.  RISKS AND COMPLICATIONS  Take your time so you do not get dizzy or light-headed.  If you are in pain, you may need to take or ask for pain medication before doing incentive spirometry. It is harder to take  a deep breath if you are having pain. AFTER USE  Rest and breathe slowly  and easily.  It can be helpful to keep track of a log of your progress. Your caregiver can provide you with a simple table to help with this. If you are using the spirometer at home, follow these instructions: [place] IF:   You are having difficultly using the spirometer.  You have trouble using the spirometer as often as instructed.  Your pain medication is not giving enough relief while using the spirometer.  You develop fever of 100.5 F (38.1 C) or higher. SEEK IMMEDIATE MEDICAL CARE IF:   You cough up bloody sputum that had not been present before.  You develop fever of 102 F (38.9 C) or greater.  You develop worsening pain at or near the incision site. MAKE SURE YOU:   Understand these instructions.  Will watch your condition.  Will get help right away if you are not doing well or get worse. Document Released: 07/20/2006 Document Revised: 06/01/2011 Document Reviewed: 09/20/2006 Exit[employer] Patient Information 2014 [employer], [place].   ________________________________________________________________________  WHAT IS A BLOOD TRANSFUSION? Blood Transfusion Information  A transfusion is the replacement of blood or some of its parts. Blood is made up of multiple cells which provide different functions.  Red blood cells carry oxygen and are used for blood loss replacement.  White blood cells fight against infection.  Platelets control bleeding.  Plasma helps clot blood.  Other blood products are available for specialized needs, such as hemophilia or other clotting disorders. BEFORE THE TRANSFUSION  Who gives blood for transfusions?   Healthy volunteers who are fully evaluated to make sure their blood is safe. This is blood bank blood. Transfusion therapy is the safest it has ever been in the practice of medicine. Before blood is taken from a donor, a complete history is taken to make sure that person has no history of diseases nor engages in risky social  behavior (examples are intravenous drug use or sexual activity with multiple partners). The donor's travel history is screened to minimize risk of transmitting infections, such as malaria. The donated blood is tested for signs of infectious diseases, such as HIV and hepatitis. The blood is then tested to be sure it is compatible with you in order to minimize the chance of a transfusion reaction. If you or a relative donates blood, this is often done in anticipation of surgery and is not appropriate for emergency situations. It takes many days to process the donated blood. RISKS AND COMPLICATIONS Although transfusion therapy is very safe and saves many lives, the main dangers of transfusion include:   Getting an infectious disease.  Developing a transfusion reaction. This is an allergic reaction to something in the blood you were given. Every precaution is taken to prevent this. The decision to have a blood transfusion has been considered carefully by your caregiver before blood is given. Blood is not given unless the benefits outweigh the risks. AFTER THE TRANSFUSION  Right after receiving a blood transfusion, you will usually feel much better and more energetic. This is especially true if your red blood cells have gotten low (anemic). The transfusion raises the level of the red blood cells which carry oxygen, and this usually causes an energy increase.  The nurse administering the transfusion will monitor you carefully for complications. HOME CARE INSTRUCTIONS  No special instructions are needed after a transfusion. You may find your energy is better. Speak with your caregiver  about any limitations on activity for underlying diseases you may have. SEEK MEDICAL CARE IF:   Your condition is not improving after your transfusion.  You develop redness or irritation at the intravenous (IV) site. SEEK IMMEDIATE MEDICAL CARE IF:  Any of the following symptoms occur over the next 12 hours:  Shaking  chills.  You have a temperature by mouth above 102 F (38.9 C), not controlled by medicine.  Chest, back, or muscle pain.  People around you feel you are not acting correctly or are confused.  Shortness of breath or difficulty breathing.  Dizziness and fainting.  You get a rash or develop hives.  You have a decrease in urine output.  Your urine turns a dark color or changes to pink, red, or brown. Any of the following symptoms occur over the next 10 days:  You have a temperature by mouth above 102 F (38.9 C), not controlled by medicine.  Shortness of breath.  Weakness after normal activity.  The white part of the eye turns yellow (jaundice).  You have a decrease in the amount of urine or are urinating less often.  Your urine turns a dark color or changes to pink, red, or brown. Document Released: 03/06/2000 Document Revised: 06/01/2011 Document Reviewed: 10/24/2007 [facility] Patient Information 2014 [place], [place].  _______________________________________________________________________

## 2015-04-24 ENCOUNTER — Encounter (HOSPITAL_COMMUNITY)
Admission: RE | Admit: 2015-04-24 | Discharge: 2015-04-24 | Disposition: A | Discharge status: Home / Self Care | Payer: BLUE CROSS/BLUE SHIELD | Source: Ambulatory Visit | Attending: Orthopedic Surgery | Admitting: Orthopedic Surgery

## 2015-04-24 ENCOUNTER — Encounter (HOSPITAL_COMMUNITY): Payer: Self-pay

## 2015-04-24 DIAGNOSIS — Z0183 Encounter for blood typing: Secondary | ICD-10-CM | POA: Insufficient documentation

## 2015-04-24 DIAGNOSIS — Z01812 Encounter for preprocedural laboratory examination: Secondary | ICD-10-CM | POA: Insufficient documentation

## 2015-04-24 DIAGNOSIS — M879 Osteonecrosis, unspecified: Secondary | ICD-10-CM | POA: Diagnosis not present

## 2015-04-24 HISTORY — DX: Malignant hyperthermia due to anesthesia, initial encounter: T88.3XXA

## 2015-04-24 HISTORY — DX: Idiopathic aseptic necrosis of unspecified femur: M87.059

## 2015-04-24 LAB — URINALYSIS, ROUTINE W REFLEX MICROSCOPIC
BILIRUBIN URINE: NEGATIVE
GLUCOSE, UA: NEGATIVE mg/dL
Hgb urine dipstick: NEGATIVE
KETONES UR: NEGATIVE mg/dL
Leukocytes, UA: NEGATIVE
Nitrite: NEGATIVE
PH: 5 (ref 5.0–8.0)
PROTEIN: NEGATIVE mg/dL
Specific Gravity, Urine: 1.017 (ref 1.005–1.030)

## 2015-04-24 LAB — CBC
HEMATOCRIT: 40.7 % (ref 39.0–52.0)
HEMOGLOBIN: 13.6 g/dL (ref 13.0–17.0)
MCH: 28.5 pg (ref 26.0–34.0)
MCHC: 33.4 g/dL (ref 30.0–36.0)
MCV: 85.3 fL (ref 78.0–100.0)
Platelets: 202 10*3/uL (ref 150–400)
RBC: 4.77 MIL/uL (ref 4.22–5.81)
RDW: 13.2 % (ref 11.5–15.5)
WBC: 5.9 10*3/uL (ref 4.0–10.5)

## 2015-04-24 LAB — BASIC METABOLIC PANEL
ANION GAP: 9 (ref 5–15)
BUN: 12 mg/dL (ref 6–20)
CALCIUM: 8.8 mg/dL — AB (ref 8.9–10.3)
CO2: 24 mmol/L (ref 22–32)
Chloride: 104 mmol/L (ref 101–111)
Creatinine, Ser: 1.01 mg/dL (ref 0.61–1.24)
Glucose, Bld: 91 mg/dL (ref 65–99)
POTASSIUM: 3.7 mmol/L (ref 3.5–5.1)
Sodium: 137 mmol/L (ref 135–145)

## 2015-04-24 LAB — SURGICAL PCR SCREEN
MRSA, PCR: NEGATIVE
Staphylococcus aureus: NEGATIVE

## 2015-04-24 LAB — PROTIME-INR
INR: 2.02 — AB (ref 0.00–1.49)
PROTHROMBIN TIME: 22.7 s — AB (ref 11.6–15.2)

## 2015-04-24 LAB — APTT: aPTT: 41 seconds — ABNORMAL HIGH (ref 24–37)

## 2015-04-24 NOTE — Progress Notes (Signed)
I SPOKE WITH DR.EWELL CONCERNING  PT'S BROTHER HAVING HX OF MALIGNANT HYPERTHERMIA NOTATION MADE ON OR SCHEDULE OR SCHEDULER, VICKIE NOTIFIED

## 2015-04-29 ENCOUNTER — Ambulatory Visit (INDEPENDENT_AMBULATORY_CARE_PROVIDER_SITE_OTHER): Payer: BLUE CROSS/BLUE SHIELD

## 2015-04-29 DIAGNOSIS — Z7901 Long term (current) use of anticoagulants: Secondary | ICD-10-CM

## 2015-04-29 LAB — POCT INR: INR: 1.1

## 2015-04-29 NOTE — Patient Instructions (Signed)
Continue same dose. 1 whole tablet on all days except Wednesday 1/2. Resume 24 hrs after surgery.

## 2015-04-30 ENCOUNTER — Inpatient Hospital Stay (HOSPITAL_COMMUNITY): Payer: BLUE CROSS/BLUE SHIELD | Admitting: Anesthesiology

## 2015-04-30 ENCOUNTER — Inpatient Hospital Stay (HOSPITAL_COMMUNITY): Payer: BLUE CROSS/BLUE SHIELD

## 2015-04-30 ENCOUNTER — Inpatient Hospital Stay (HOSPITAL_COMMUNITY)
Admission: RE | Admit: 2015-04-30 | Discharge: 2015-05-01 | DRG: 470 | Disposition: A | Discharge status: Home Health | Payer: BLUE CROSS/BLUE SHIELD | Source: Ambulatory Visit | Attending: Orthopedic Surgery | Admitting: Orthopedic Surgery

## 2015-04-30 ENCOUNTER — Encounter (HOSPITAL_COMMUNITY)
Admission: RE | Disposition: A | Discharge status: Home Health | Payer: Self-pay | Source: Ambulatory Visit | Attending: Orthopedic Surgery

## 2015-04-30 ENCOUNTER — Encounter (HOSPITAL_COMMUNITY): Payer: Self-pay | Admitting: *Deleted

## 2015-04-30 DIAGNOSIS — M25552 Pain in left hip: Secondary | ICD-10-CM | POA: Diagnosis present

## 2015-04-30 DIAGNOSIS — Z86711 Personal history of pulmonary embolism: Secondary | ICD-10-CM

## 2015-04-30 DIAGNOSIS — Z01812 Encounter for preprocedural laboratory examination: Secondary | ICD-10-CM | POA: Diagnosis not present

## 2015-04-30 DIAGNOSIS — M1612 Unilateral primary osteoarthritis, left hip: Principal | ICD-10-CM | POA: Diagnosis present

## 2015-04-30 DIAGNOSIS — Z7901 Long term (current) use of anticoagulants: Secondary | ICD-10-CM

## 2015-04-30 DIAGNOSIS — Z96641 Presence of right artificial hip joint: Secondary | ICD-10-CM | POA: Diagnosis present

## 2015-04-30 DIAGNOSIS — Z981 Arthrodesis status: Secondary | ICD-10-CM | POA: Diagnosis not present

## 2015-04-30 DIAGNOSIS — Z86718 Personal history of other venous thrombosis and embolism: Secondary | ICD-10-CM

## 2015-04-30 DIAGNOSIS — E663 Overweight: Secondary | ICD-10-CM | POA: Diagnosis present

## 2015-04-30 DIAGNOSIS — Z6825 Body mass index (BMI) 25.0-25.9, adult: Secondary | ICD-10-CM

## 2015-04-30 DIAGNOSIS — Z96649 Presence of unspecified artificial hip joint: Secondary | ICD-10-CM

## 2015-04-30 HISTORY — PX: TOTAL HIP ARTHROPLASTY: SHX124

## 2015-04-30 LAB — TYPE AND SCREEN
ABO/RH(D): O POS
ANTIBODY SCREEN: NEGATIVE

## 2015-04-30 LAB — CBC
HEMATOCRIT: 34.5 % — AB (ref 39.0–52.0)
HEMOGLOBIN: 11.4 g/dL — AB (ref 13.0–17.0)
MCH: 28.9 pg (ref 26.0–34.0)
MCHC: 33 g/dL (ref 30.0–36.0)
MCV: 87.6 fL (ref 78.0–100.0)
Platelets: 185 10*3/uL (ref 150–400)
RBC: 3.94 MIL/uL — AB (ref 4.22–5.81)
RDW: 13 % (ref 11.5–15.5)
WBC: 6.3 10*3/uL (ref 4.0–10.5)

## 2015-04-30 LAB — CREATININE, SERUM
Creatinine, Ser: 0.96 mg/dL (ref 0.61–1.24)
GFR calc non Af Amer: >60 mL/min (ref >60)

## 2015-04-30 LAB — PROTIME-INR
INR: 1 (ref 0.00–1.49)
PROTHROMBIN TIME: 13.4 s (ref 11.6–15.2)

## 2015-04-30 SURGERY — ARTHROPLASTY, HIP, TOTAL, ANTERIOR APPROACH
Anesthesia: Spinal | Site: Hip | Laterality: Left

## 2015-04-30 MED ORDER — BUPIVACAINE IN DEXTROSE 0.75-8.25 % IT SOLN
INTRATHECAL | Status: DC | PRN
  Administered 2015-04-30: 2 mL via INTRATHECAL

## 2015-04-30 MED ORDER — ONDANSETRON HCL 4 MG/2ML IJ SOLN: 4.0000 mg | Freq: Four times a day (QID) | INTRAMUSCULAR | Status: DC | PRN

## 2015-04-30 MED ORDER — PHENOL 1.4 % MT LIQD
1.0000 | OROMUCOSAL | Status: DC | PRN
  Filled 2015-04-30: qty 177

## 2015-04-30 MED ORDER — DOCUSATE SODIUM 100 MG PO CAPS
100.0000 mg | ORAL_CAPSULE | Freq: Two times a day (BID) | ORAL | Status: DC
  Administered 2015-04-30 – 2015-05-01 (×2): 100 mg via ORAL

## 2015-04-30 MED ORDER — MEPERIDINE HCL 50 MG/ML IJ SOLN: 6.2500 mg | INTRAMUSCULAR | Status: DC | PRN

## 2015-04-30 MED ORDER — ENOXAPARIN SODIUM 40 MG/0.4ML ~~LOC~~ SOLN
40.0000 mg | SUBCUTANEOUS | Status: DC
  Administered 2015-05-01: 40 mg via SUBCUTANEOUS
  Filled 2015-04-30 (×2): qty 0.4

## 2015-04-30 MED ORDER — METHOCARBAMOL 500 MG PO TABS
500.0000 mg | ORAL_TABLET | Freq: Four times a day (QID) | ORAL | Status: DC | PRN
  Administered 2015-05-01: 500 mg via ORAL
  Filled 2015-04-30: qty 1

## 2015-04-30 MED ORDER — TRANEXAMIC ACID 1000 MG/10ML IV SOLN
2000.0000 mg | Freq: Once | INTRAVENOUS | Status: DC
  Filled 2015-04-30: qty 20

## 2015-04-30 MED ORDER — PROPOFOL 10 MG/ML IV BOLUS
INTRAVENOUS | Status: AC
  Filled 2015-04-30: qty 20

## 2015-04-30 MED ORDER — CHLORHEXIDINE GLUCONATE 4 % EX LIQD: 60.0000 mL | Freq: Once | CUTANEOUS | Status: DC

## 2015-04-30 MED ORDER — ONDANSETRON HCL 4 MG PO TABS
4.0000 mg | ORAL_TABLET | Freq: Four times a day (QID) | ORAL | Status: DC | PRN
  Filled 2015-04-30: qty 1

## 2015-04-30 MED ORDER — TRANEXAMIC ACID 1000 MG/10ML IV SOLN
2000.0000 mg | INTRAVENOUS | Status: DC | PRN
  Administered 2015-04-30: 2000 mg via INTRAVENOUS

## 2015-04-30 MED ORDER — WARFARIN SODIUM 7.5 MG PO TABS
7.5000 mg | ORAL_TABLET | Freq: Once | ORAL | Status: AC
  Administered 2015-04-30: 7.5 mg via ORAL
  Filled 2015-04-30: qty 1

## 2015-04-30 MED ORDER — METHOCARBAMOL 1000 MG/10ML IJ SOLN
500.0000 mg | Freq: Four times a day (QID) | INTRAVENOUS | Status: DC | PRN
  Administered 2015-04-30: 500 mg via INTRAVENOUS
  Filled 2015-04-30 (×2): qty 5

## 2015-04-30 MED ORDER — CEFAZOLIN SODIUM-DEXTROSE 2-3 GM-% IV SOLR
2.0000 g | Freq: Four times a day (QID) | INTRAVENOUS | Status: AC
  Administered 2015-04-30 (×2): 2 g via INTRAVENOUS
  Filled 2015-04-30 (×2): qty 50

## 2015-04-30 MED ORDER — OXYCODONE HCL 5 MG PO TABS: 5.0000 mg | ORAL_TABLET | Freq: Once | ORAL | Status: DC | PRN

## 2015-04-30 MED ORDER — ACETAMINOPHEN 500 MG PO TABS
1000.0000 mg | ORAL_TABLET | Freq: Three times a day (TID) | ORAL | Status: DC
  Administered 2015-04-30 – 2015-05-01 (×3): 1000 mg via ORAL
  Filled 2015-04-30 (×6): qty 2

## 2015-04-30 MED ORDER — ALUM & MAG HYDROXIDE-SIMETH 200-200-20 MG/5ML PO SUSP: 30.0000 mL | ORAL | Status: DC | PRN

## 2015-04-30 MED ORDER — MAGNESIUM CITRATE PO SOLN: 1.0000 | Freq: Once | ORAL | Status: DC | PRN

## 2015-04-30 MED ORDER — ONDANSETRON HCL 4 MG/2ML IJ SOLN
INTRAMUSCULAR | Status: DC | PRN
  Administered 2015-04-30: 4 mg via INTRAVENOUS

## 2015-04-30 MED ORDER — OXYCODONE HCL 5 MG/5ML PO SOLN: 5.0000 mg | Freq: Once | ORAL | Status: DC | PRN

## 2015-04-30 MED ORDER — DEXTROSE 5 % IV SOLN
10.0000 mg | INTRAVENOUS | Status: DC | PRN
  Administered 2015-04-30: 60 ug/min via INTRAVENOUS

## 2015-04-30 MED ORDER — HYDROMORPHONE HCL 1 MG/ML IJ SOLN
INTRAMUSCULAR | Status: AC
  Filled 2015-04-30: qty 1

## 2015-04-30 MED ORDER — METOCLOPRAMIDE HCL 5 MG PO TABS
5.0000 mg | ORAL_TABLET | Freq: Three times a day (TID) | ORAL | Status: DC | PRN
  Filled 2015-04-30: qty 2

## 2015-04-30 MED ORDER — PROPOFOL 10 MG/ML IV BOLUS
INTRAVENOUS | Status: DC | PRN
  Administered 2015-04-30: 30 mg via INTRAVENOUS

## 2015-04-30 MED ORDER — SODIUM CHLORIDE 0.9 % IV SOLN
100.0000 mL/h | INTRAVENOUS | Status: DC
  Administered 2015-04-30: 100 mL/h via INTRAVENOUS
  Filled 2015-04-30 (×3): qty 1000

## 2015-04-30 MED ORDER — DEXAMETHASONE SODIUM PHOSPHATE 10 MG/ML IJ SOLN
10.0000 mg | Freq: Once | INTRAMUSCULAR | Status: AC
  Administered 2015-04-30: 10 mg via INTRAVENOUS

## 2015-04-30 MED ORDER — LACTATED RINGERS IV SOLN
INTRAVENOUS | Status: DC
Start: 2015-04-30 — End: 2015-04-30
  Administered 2015-04-30: 12:00:00 via INTRAVENOUS
  Administered 2015-04-30: 1000 mL via INTRAVENOUS
  Administered 2015-04-30: 13:00:00 via INTRAVENOUS

## 2015-04-30 MED ORDER — BISACODYL 10 MG RE SUPP: 10.0000 mg | Freq: Every day | RECTAL | Status: DC | PRN

## 2015-04-30 MED ORDER — WARFARIN - PHARMACIST DOSING INPATIENT
Freq: Every day | Status: DC
Start: 2015-04-30 — End: 2015-05-01

## 2015-04-30 MED ORDER — FERROUS SULFATE 325 (65 FE) MG PO TABS
325.0000 mg | ORAL_TABLET | Freq: Three times a day (TID) | ORAL | Status: DC
  Administered 2015-04-30 – 2015-05-01 (×2): 325 mg via ORAL
  Filled 2015-04-30 (×5): qty 1

## 2015-04-30 MED ORDER — PROPOFOL 500 MG/50ML IV EMUL
INTRAVENOUS | Status: DC | PRN
  Administered 2015-04-30: 75 ug/kg/min via INTRAVENOUS

## 2015-04-30 MED ORDER — CEFAZOLIN SODIUM-DEXTROSE 2-3 GM-% IV SOLR
2.0000 g | INTRAVENOUS | Status: AC
  Administered 2015-04-30: 2 g via INTRAVENOUS

## 2015-04-30 MED ORDER — DEXAMETHASONE SODIUM PHOSPHATE 10 MG/ML IJ SOLN
10.0000 mg | Freq: Once | INTRAMUSCULAR | Status: AC
  Administered 2015-05-01: 10 mg via INTRAVENOUS
  Filled 2015-04-30: qty 1

## 2015-04-30 MED ORDER — STERILE WATER FOR IRRIGATION IR SOLN
Status: DC | PRN
  Administered 2015-04-30: 1000 mL

## 2015-04-30 MED ORDER — METOCLOPRAMIDE HCL 5 MG/ML IJ SOLN: 5.0000 mg | Freq: Three times a day (TID) | INTRAMUSCULAR | Status: DC | PRN

## 2015-04-30 MED ORDER — MIDAZOLAM HCL 2 MG/2ML IJ SOLN
INTRAMUSCULAR | Status: AC
  Filled 2015-04-30: qty 2

## 2015-04-30 MED ORDER — CELECOXIB 200 MG PO CAPS
200.0000 mg | ORAL_CAPSULE | Freq: Two times a day (BID) | ORAL | Status: DC
  Administered 2015-04-30 – 2015-05-01 (×2): 200 mg via ORAL
  Filled 2015-04-30 (×4): qty 1

## 2015-04-30 MED ORDER — PHENYLEPHRINE HCL 10 MG/ML IJ SOLN
INTRAMUSCULAR | Status: AC
  Filled 2015-04-30: qty 1

## 2015-04-30 MED ORDER — FENTANYL CITRATE (PF) 100 MCG/2ML IJ SOLN
INTRAMUSCULAR | Status: AC
  Filled 2015-04-30: qty 2

## 2015-04-30 MED ORDER — HYDROMORPHONE HCL 1 MG/ML IJ SOLN
INTRAMUSCULAR | Status: AC
Start: 2015-04-30 — End: 2015-05-01
  Filled 2015-04-30: qty 1

## 2015-04-30 MED ORDER — MIDAZOLAM HCL 5 MG/5ML IJ SOLN
INTRAMUSCULAR | Status: DC | PRN
  Administered 2015-04-30: 2 mg via INTRAVENOUS

## 2015-04-30 MED ORDER — OXYCODONE HCL 5 MG PO TABS
5.0000 mg | ORAL_TABLET | ORAL | Status: DC
  Administered 2015-04-30 (×2): 10 mg via ORAL
  Administered 2015-04-30 (×2): 5 mg via ORAL
  Administered 2015-05-01 (×2): 10 mg via ORAL
  Filled 2015-04-30 (×2): qty 2
  Filled 2015-04-30: qty 1
  Filled 2015-04-30: qty 2
  Filled 2015-04-30: qty 1
  Filled 2015-04-30: qty 2

## 2015-04-30 MED ORDER — FENTANYL CITRATE (PF) 100 MCG/2ML IJ SOLN: INTRAMUSCULAR | Status: DC | PRN

## 2015-04-30 MED ORDER — HYDROMORPHONE HCL 1 MG/ML IJ SOLN: 0.5000 mg | INTRAMUSCULAR | Status: DC | PRN

## 2015-04-30 MED ORDER — FENTANYL CITRATE (PF) 100 MCG/2ML IJ SOLN
INTRAMUSCULAR | Status: DC | PRN
  Administered 2015-04-30: 100 ug via INTRAVENOUS

## 2015-04-30 MED ORDER — CEFAZOLIN SODIUM-DEXTROSE 2-3 GM-% IV SOLR
INTRAVENOUS | Status: AC
Start: 2015-04-30 — End: 2015-04-30
  Filled 2015-04-30: qty 50

## 2015-04-30 MED ORDER — DIPHENHYDRAMINE HCL 25 MG PO CAPS: 25.0000 mg | ORAL_CAPSULE | Freq: Four times a day (QID) | ORAL | Status: DC | PRN

## 2015-04-30 MED ORDER — MENTHOL 3 MG MT LOZG: 1.0000 | LOZENGE | OROMUCOSAL | Status: DC | PRN

## 2015-04-30 MED ORDER — SODIUM CHLORIDE 0.9 % IR SOLN
Status: DC | PRN
  Administered 2015-04-30: 1000 mL

## 2015-04-30 MED ORDER — POLYETHYLENE GLYCOL 3350 17 G PO PACK
17.0000 g | PACK | Freq: Two times a day (BID) | ORAL | Status: DC
  Administered 2015-04-30 – 2015-05-01 (×2): 17 g via ORAL

## 2015-04-30 MED ORDER — HYDROMORPHONE HCL 1 MG/ML IJ SOLN
0.2500 mg | INTRAMUSCULAR | Status: DC | PRN
  Administered 2015-04-30 (×4): 0.5 mg via INTRAVENOUS

## 2015-04-30 SURGICAL SUPPLY — 37 items
BAG DECANTER FOR FLEXI CONT (MISCELLANEOUS) ×2 IMPLANT
BAG SPEC THK2 15X12 ZIP CLS (MISCELLANEOUS) ×1
BAG ZIPLOCK 12X15 (MISCELLANEOUS) ×2 IMPLANT
CAPT HIP TOTAL 2 ×2 IMPLANT
CLOTH BEACON ORANGE TIMEOUT ST (SAFETY) ×3 IMPLANT
COVER PERINEAL POST (MISCELLANEOUS) ×3 IMPLANT
DRAPE STERI IOBAN 125X83 (DRAPES) ×3 IMPLANT
DRAPE U-SHAPE 47X51 STRL (DRAPES) ×6 IMPLANT
DRSG AQUACEL AG ADV 3.5X10 (GAUZE/BANDAGES/DRESSINGS) ×3 IMPLANT
DURAPREP 26ML APPLICATOR (WOUND CARE) ×3 IMPLANT
ELECT REM PT RETURN 15FT ADLT (MISCELLANEOUS) IMPLANT
ELECT REM PT RETURN 9FT ADLT (ELECTROSURGICAL) ×3
ELECTRODE REM PT RTRN 9FT ADLT (ELECTROSURGICAL) ×1 IMPLANT
GLOVE BIOGEL M 7.0 STRL (GLOVE) IMPLANT
GLOVE BIOGEL M STRL SZ7.5 (GLOVE) ×4 IMPLANT
GLOVE BIOGEL PI IND STRL 7.5 (GLOVE) ×1 IMPLANT
GLOVE BIOGEL PI IND STRL 8.5 (GLOVE) ×1 IMPLANT
GLOVE BIOGEL PI INDICATOR 7.5 (GLOVE) ×2
GLOVE BIOGEL PI INDICATOR 8.5 (GLOVE) ×4
GLOVE ECLIPSE 8.0 STRL XLNG CF (GLOVE) ×6 IMPLANT
GLOVE ORTHO TXT STRL SZ7.5 (GLOVE) ×3 IMPLANT
GLOVE SURG SS PI 8.5 STRL IVOR (GLOVE) ×2
GLOVE SURG SS PI 8.5 STRL STRW (GLOVE) IMPLANT
GOWN STRL REUS W/TWL LRG LVL3 (GOWN DISPOSABLE) ×3 IMPLANT
GOWN STRL REUS W/TWL XL LVL3 (GOWN DISPOSABLE) ×7 IMPLANT
HOLDER FOLEY CATH W/STRAP (MISCELLANEOUS) ×3 IMPLANT
LIQUID BAND (GAUZE/BANDAGES/DRESSINGS) ×3 IMPLANT
PACK ANTERIOR HIP CUSTOM (KITS) ×3 IMPLANT
SAW OSC TIP CART 19.5X105X1.3 (SAW) ×3 IMPLANT
SUT MNCRL AB 4-0 PS2 18 (SUTURE) ×3 IMPLANT
SUT VIC AB 1 CT1 36 (SUTURE) ×9 IMPLANT
SUT VIC AB 2-0 CT1 27 (SUTURE) ×6
SUT VIC AB 2-0 CT1 TAPERPNT 27 (SUTURE) ×2 IMPLANT
SUT VLOC 180 0 24IN GS25 (SUTURE) ×3 IMPLANT
TRAY FOLEY W/METER SILVER 16FR (SET/KITS/TRAYS/PACK) ×2 IMPLANT
WATER STERILE IRR 1500ML POUR (IV SOLUTION) ×3 IMPLANT
YANKAUER SUCT BULB TIP 10FT TU (MISCELLANEOUS) ×2 IMPLANT

## 2015-04-30 NOTE — Anesthesia Preprocedure Evaluation (Addendum)
Anesthesia Evaluation  Patient identified by MRN, date of birth, ID band Patient awake    Reviewed: Allergy & Precautions, NPO status , Patient's Chart, lab work & pertinent test results  History of Anesthesia Complications (+) MALIGNANT HYPERTHERMIA and Family history of anesthesia reactionHistory of anesthetic complications: In his brother.  Airway Mallampati: I  TM Distance: >3 FB Neck ROM: Full    Dental  (+) Teeth Intact, Dental Advisory Given   Pulmonary    breath sounds clear to auscultation       Cardiovascular  Rhythm:Regular Rate:Normal     Neuro/Psych    GI/Hepatic   Endo/Other    Renal/GU      Musculoskeletal   Abdominal   Peds  Hematology   Anesthesia Other Findings   Reproductive/Obstetrics                            Anesthesia Physical Anesthesia Plan  ASA: III  Anesthesia Plan: Spinal   Post-op Pain Management:    Induction: Intravenous  Airway Management Planned: Simple Face Mask  Additional Equipment:   Intra-op Plan:   Post-operative Plan:   Informed Consent: I have reviewed the patients History and Physical, chart, labs and discussed the procedure including the risks, benefits and alternatives for the proposed anesthesia with the patient or authorized representative who has indicated his/her understanding and acceptance.   Dental advisory given  Plan Discussed with: CRNA, Anesthesiologist and Surgeon  Anesthesia Plan Comments:        Anesthesia Quick Evaluation

## 2015-04-30 NOTE — Anesthesia Postprocedure Evaluation (Signed)
Anesthesia Post Note  Patient: [name]  Procedure(s) Performed: Procedure(s) (LRB): LEFT TOTAL HIP ARTHROPLASTY ANTERIOR APPROACH (Left)  Patient location during evaluation: PACU Anesthesia Type: General Level of consciousness: awake and alert Pain management: pain level controlled Vital Signs Assessment: post-procedure vital signs reviewed and stable Respiratory status: spontaneous breathing, nonlabored ventilation, respiratory function stable and patient connected to nasal cannula oxygen Cardiovascular status: blood pressure returned to baseline and stable Postop Assessment: no signs of nausea or vomiting, spinal receding and patient able to bend at knees Anesthetic complications: no    Last Vitals:  Filed Vitals:   04/30/15 0831 04/30/15 1303  BP: 149/90 129/81  Pulse: 77 70  Temp: 36.7 C 36.4 C  Resp: 18 14    Last Pain:  Filed Vitals:   04/30/15 1334  PainSc: 4     LLE Motor Response: Purposeful movement (04/30/15 1330) LLE Sensation: Decreased (04/30/15 1330) RLE Motor Response: Purposeful movement (04/30/15 1330) RLE Sensation: Decreased (04/30/15 1330) L Sensory Level: L2-Upper inner thigh, upper buttock (04/30/15 1330) R Sensory Level: L2-Upper inner thigh, upper buttock (04/30/15 1330)  Juanluis Guastella A

## 2015-04-30 NOTE — Discharge Instructions (Signed)

## 2015-04-30 NOTE — Anesthesia Procedure Notes (Signed)
Spinal Patient location during procedure: OR End time: 04/30/2015 11:15 AM Staffing Performed by: anesthesiologist  Preanesthetic Checklist Completed: patient identified, site marked, surgical consent, pre-op evaluation, timeout performed, IV checked, risks and benefits discussed and monitors and equipment checked Spinal Block Patient position: sitting Prep: Betadine Patient monitoring: heart rate, continuous pulse ox and blood pressure Location: L4-5 Injection technique: single-shot Needle Needle type: Spinocan and Sprotte  Needle gauge: 24 G Needle length: 9 cm Assessment Sensory level: T4 Additional Notes Expiration date of kit checked and confirmed. Patient tolerated procedure well, without complications.

## 2015-04-30 NOTE — Op Note (Signed)
NAME:  [name]                ACCOUNT NO.: [id number]      MEDICAL RECORD NO.: [id number]      FACILITY:  [facility]      PHYSICIAN:  Durene Romans D  DATE OF BIRTH:  [birth year]     DATE OF PROCEDURE:  04/30/2015                                 OPERATIVE REPORT         PREOPERATIVE DIAGNOSIS: Left  hip osteoarthritis.      POSTOPERATIVE DIAGNOSIS:  Left hip osteoarthritis. History of right total hip replacement     PROCEDURE:  Left total hip replacement through an anterior approach   utilizing DePuy THR system, component size 52mm pinnacle cup, a size 36+4 neutral   Altrex liner, a size 6 Hi Tri Lock stem with a 36+1.5 delta ceramic   ball.      SURGEON:  Madlyn Frankel. Charlann Boxer, M.D.      ASSISTANT:  Skip Mayer, PA-C      ANESTHESIA:  Spinal.      SPECIMENS:  None.      COMPLICATIONS:  None.      BLOOD LOSS:  200 cc     DRAINS:  None.      INDICATION OF THE PROCEDURE:  [name] is a 70 y.o. male who had   presented to office for evaluation of left hip pain.  Radiographs revealed   progressive degenerative changes with bone-on-bone   articulation to the  hip joint.  The patient had painful limited range of   motion significantly affecting their overall quality of life.  The patient was failing to    respond to conservative measures, and at this point was ready   to proceed with more definitive measures.  The patient has noted progressive   degenerative changes in his hip, progressive problems and dysfunction   with regarding the hip prior to surgery.  Consent was obtained for   benefit of pain relief.  Specific risk of infection, DVT, component   failure, dislocation, need for revision surgery, as well discussion of   the anterior versus posterior approach were reviewed.  Consent was   obtained for benefit of anterior pain relief through an anterior   approach.      PROCEDURE IN DETAIL:  The patient was brought to operative theater.    Once adequate anesthesia, preoperative antibiotics, 2gm of Ancef and 10 mg of Decadron (2 gm of Tranexamic Acid administered topically once capsule was closed) administered.   The patient was positioned supine on the OSI Hanna table.  Once adequate   padding of boney process was carried out, we had predraped out the hip, and  used fluoroscopy to confirm orientation of the pelvis and position.      The left hip was then prepped and draped from proximal iliac crest to   mid thigh with shower curtain technique.      Time-out was performed identifying the patient, planned procedure, and   extremity.     An incision was then made 2 cm distal and lateral to the   anterior superior iliac spine extending over the orientation of the   tensor fascia lata muscle and sharp dissection was carried down to the   fascia of the muscle and  protractor placed in the soft tissues.      The fascia was then incised.  The muscle belly was identified and swept   laterally and retractor placed along the superior neck.  Following   cauterization of the circumflex vessels and removing some pericapsular   fat, a second cobra retractor was placed on the inferior neck.  A third   retractor was placed on the anterior acetabulum after elevating the   anterior rectus.  A L-capsulotomy was along the line of the   superior neck to the trochanteric fossa, then extended proximally and   distally.  Tag sutures were placed and the retractors were then placed   intracapsular.  We then identified the trochanteric fossa and   orientation of my neck cut, confirmed this radiographically   and then made a neck osteotomy with the femur on traction.  The femoral   head was removed without difficulty or complication.  Traction was let   off and retractors were placed posterior and anterior around the   acetabulum.      The labrum and foveal tissue were debrided.  I began reaming with a 47mm   reamer and reamed up to 51mm reamer  with good bony bed preparation and a 52mm   cup was chosen.  The final 52mm Pinnacle cup was then impacted under fluoroscopy  to confirm the depth of penetration and orientation with respect to   abduction.  A screw was placed followed by the hole eliminator.  The final   36+4 neutral Altrex liner was impacted with good visualized rim fit.  The cup was positioned anatomically within the acetabular portion of the pelvis.      At this point, the femur was rolled at 80 degrees.  Further capsule was   released off the inferior aspect of the femoral neck.  I then   released the superior capsule proximally.  The hook was placed laterally   along the femur and elevated manually and held in position with the bed   hook.  The leg was then extended and adducted with the leg rolled to 100   degrees of external rotation.  Once the proximal femur was fully   exposed, I used a box osteotome to set orientation.  I then began   broaching with the starting chili pepper broach and passed this by hand and then broached up to 6.  With the 6 broach in place I chose a high offset neck and did a trial reduction.  The offset was appropriate, leg lengths   appeared to be equal, confirmed radiographically.   Given these findings, I went ahead and dislocated the hip, repositioned all   retractors and positioned the right hip in the extended and abducted position.  The final 6 Hi Tri Lock stem was   chosen and it was impacted down to the level of neck cut.  Based on this   and the trial reduction, a 36+1.5 delta ceramic ball was chosen and   impacted onto a clean and dry trunnion, and the hip was reduced.  The   hip had been irrigated throughout the case again at this point.  I did   reapproximate the superior capsular leaflet to the anterior leaflet   using #1 Vicryl.  The fascia of the   tensor fascia lata muscle was then reapproximated using #1 Vicryl and #0 V-lock sutures.  The   remaining wound was closed with 2-0  Vicryl and running 4-0 Monocryl.  The hip was cleaned, dried, and dressed sterilely using Dermabond and   Aquacel dressing.  He was then brought   to recovery room in stable condition tolerating the procedure well.    Skip Mayer, PA-C was present for the entirety of the case involved from   preoperative positioning, perioperative retractor management, general   facilitation of the case, as well as primary wound closure as assistant.            Madlyn Frankel Charlann Boxer, M.D.        04/30/2015 12:33 PM

## 2015-04-30 NOTE — Addendum Note (Signed)
Addendum  created 04/30/15 1510 by Illene Silver, CRNA   Modules edited: Charges VN

## 2015-04-30 NOTE — Interval H&P Note (Signed)
History and Physical Interval Note:  04/30/2015 10:13 AM  [name]  has presented today for surgery, with the diagnosis of left hip avn  The various methods of treatment have been discussed with the patient and family. After consideration of risks, benefits and other options for treatment, the patient has consented to  Procedure(s): LEFT TOTAL HIP ARTHROPLASTY ANTERIOR APPROACH (Left) as a surgical intervention .  The patient's history has been reviewed, patient examined, no change in status, stable for surgery.  I have reviewed the patient's chart and labs.  Questions were answered to the patient's satisfaction.     Shelda Pal

## 2015-04-30 NOTE — Progress Notes (Signed)
ANTICOAGULATION CONSULT NOTE - Initial Consult  Pharmacy Consult for Warfarin Indication: H/O DVT & PE  Allergies  Allergen Reactions  . Lyrica [Pregabalin]     "went out of head"    Patient Measurements: Height: 5\' 8"  (172.7 cm) Weight: 179 lb (81.194 kg) IBW/kg (Calculated) : 68.4  Vital Signs: Temp: 98.3 F (36.8 C) (02/07 1427) Temp Source: Oral (02/07 0831) BP: 149/82 mmHg (02/07 1427) Pulse Rate: 73 (02/07 1427)  Labs:  Recent Labs  04/29/15 0807 04/30/15 0850 04/30/15 1347  HGB  --   --  11.4*  HCT  --   --  34.5*  PLT  --   --  185  LABPROT  --  13.4  --   INR 1.1 1.00  --   CREATININE  --   --  0.96    Estimated Creatinine Clearance: 71.3 mL/min (by C-G formula based on Cr of 0.96).   Medical History: Past Medical History  Diagnosis Date  . DVT (deep venous thrombosis) (HCC) 2009  . Pulmonary embolism (HCC) 2009  . Anticoagulated on Coumadin   . Vertigo     NO RECENT PROBLEMS  . Family history of anesthesia complication     BROTHER HAD MALIGNANT HYPOTHERMIA - PT HAS NOT HAD ANY PROBLEMS WITH ANESTHESIA    . Avascular necrosis of hip (HCC)     LEFT  . History of kidney stones   . Malignant hyperthermia     PT'S BROTHER HAS HX MALIGNANT HYPERTHERMIA    Medications:  Scheduled:  . acetaminophen  1,000 mg Oral 3 times per day  .  ceFAZolin (ANCEF) IV  2 g Intravenous Q6H  . celecoxib  200 mg Oral Q12H  . [START ON 05/01/2015] dexamethasone  10 mg Intravenous Once  . docusate sodium  100 mg Oral BID  . [START ON 05/01/2015] enoxaparin (LOVENOX) injection  40 mg Subcutaneous Q24H  . ferrous sulfate  325 mg Oral TID PC  . HYDROmorphone      . HYDROmorphone      . oxyCODONE  5-15 mg Oral Q4H  . polyethylene glycol  17 g Oral BID  . Warfarin - Pharmacist Dosing Inpatient   Does not apply q1800   Infusions:  . sodium chloride 0.9 % 1,000 mL with potassium chloride 10 mEq infusion      Assessment:  70 yr male s/p right THR today  Pt on  warfarin PTA for h/o PE & DVT   Home regimen = 5mg  daily except 2.5mg  on Wed (last dose taken on 2/1)  Pt bridged with Lovenox 80mg  sq q12h in preparation for surgery (last dose given 2/6 @ 6am)  INR on 2/1 = 2.02 (therapeutic)  INR preop on 2/7 = 1  Following surgery, pharmacy consulted to dose warfarin   Goal of Therapy:  INR 2-3   Plan:   Warfarin 7.5mg  po x 1 tonight (1.5 x home dose per protocol)  Lovenox 40mg  sq q24h per MD to start on 2/8 until INR therapeutic  Follow daily INR  Coralie Stanke, Joselyn Glassman, PharmD 04/30/2015,2:52 PM

## 2015-04-30 NOTE — Transfer of Care (Signed)
Immediate Anesthesia Transfer of Care Note  Patient: [name]  Procedure(s) Performed: Procedure(s): LEFT TOTAL HIP ARTHROPLASTY ANTERIOR APPROACH (Left)  Patient Location: PACU  Anesthesia Type:Spinal  Level of Consciousness: awake, alert , oriented and patient cooperative  Airway & Oxygen Therapy: Patient Spontanous Breathing and Patient connected to face mask oxygen   Post-op Assessment: Report given to RN and Post -op Vital signs reviewed and stable  Post vital signs: stable  Last Vitals:  Filed Vitals:   04/30/15 0831  BP: 149/90  Pulse: 77  Temp: 36.7 C  Resp: 18    Complications: No apparent anesthesia complications  L1 spinal level

## 2015-05-01 DIAGNOSIS — E663 Overweight: Secondary | ICD-10-CM | POA: Diagnosis present

## 2015-05-01 LAB — BASIC METABOLIC PANEL
Anion gap: 9 (ref 5–15)
BUN: 13 mg/dL (ref 6–20)
CHLORIDE: 106 mmol/L (ref 101–111)
CO2: 24 mmol/L (ref 22–32)
CREATININE: 0.92 mg/dL (ref 0.61–1.24)
Calcium: 8.7 mg/dL — ABNORMAL LOW (ref 8.9–10.3)
GFR calc Af Amer: >60 mL/min (ref >60)
GFR calc non Af Amer: >60 mL/min (ref >60)
GLUCOSE: 150 mg/dL — AB (ref 65–99)
POTASSIUM: 4.1 mmol/L (ref 3.5–5.1)
Sodium: 139 mmol/L (ref 135–145)

## 2015-05-01 LAB — CBC
HEMATOCRIT: 35.2 % — AB (ref 39.0–52.0)
HEMOGLOBIN: 11.7 g/dL — AB (ref 13.0–17.0)
MCH: 28.5 pg (ref 26.0–34.0)
MCHC: 33.2 g/dL (ref 30.0–36.0)
MCV: 85.9 fL (ref 78.0–100.0)
Platelets: 210 10*3/uL (ref 150–400)
RBC: 4.1 MIL/uL — AB (ref 4.22–5.81)
RDW: 13 % (ref 11.5–15.5)
WBC: 10.3 10*3/uL (ref 4.0–10.5)

## 2015-05-01 LAB — PROTIME-INR
INR: 1.1 (ref 0.00–1.49)
Prothrombin Time: 14 seconds (ref 11.6–15.2)

## 2015-05-01 MED ORDER — POLYETHYLENE GLYCOL 3350 17 G PO PACK: 17.0000 g | PACK | Freq: Two times a day (BID) | ORAL | Status: DC

## 2015-05-01 MED ORDER — FERROUS SULFATE 325 (65 FE) MG PO TABS: 325.0000 mg | ORAL_TABLET | Freq: Three times a day (TID) | ORAL | Status: DC

## 2015-05-01 MED ORDER — OXYCODONE HCL 5 MG PO TABS: 5.0000 mg | ORAL_TABLET | ORAL | Status: DC | PRN

## 2015-05-01 MED ORDER — ACETAMINOPHEN 500 MG PO TABS: 1000.0000 mg | ORAL_TABLET | Freq: Three times a day (TID) | ORAL | Status: AC

## 2015-05-01 MED ORDER — DOCUSATE SODIUM 100 MG PO CAPS: 100.0000 mg | ORAL_CAPSULE | Freq: Two times a day (BID) | ORAL | Status: DC

## 2015-05-01 MED ORDER — ENOXAPARIN SODIUM 40 MG/0.4ML ~~LOC~~ SOLN: 40.0000 mg | SUBCUTANEOUS | Status: DC

## 2015-05-01 MED ORDER — METHOCARBAMOL 750 MG PO TABS: 750.0000 mg | ORAL_TABLET | Freq: Four times a day (QID) | ORAL | Status: DC | PRN

## 2015-05-01 NOTE — Progress Notes (Signed)
Utilization review completed.  

## 2015-05-01 NOTE — Progress Notes (Signed)
RN reviewed discharge instructions with patient and family. All questions answered.  Paperwork and prescriptions given.   NT rolled patient down in wheelchair to family car.  

## 2015-05-01 NOTE — Care Management Note (Signed)
Case Management Note  Patient Details  Name: [name] MRN: [id number] Date of Birth: 07/16/1945  Subjective/Objective:                  LEFT TOTAL HIP ARTHROPLASTY ANTERIOR APPROACH (Left)  Action/Plan: Discharge planning Expected Discharge Date:  05/01/15               Expected Discharge Plan:  [place]  In-House Referral:     Discharge planning Services  CM Consult  Post Acute Care Choice:    Choice offered to:  Patient  DME Arranged:  N/A DME Agency:  NA  HH Arranged:  RN [place] Agency:  [place]  Status of Service:  Completed, signed off  Medicare Important Message Given:    Date Medicare IM Given:    Medicare IM give by:    Date Additional Medicare IM Given:    Additional Medicare Important Message give by:     If discussed at [place] of Stay Meetings, dates discussed:    Additional Comments: CM met with pt in room to offer choice of home health agency.  Pt chooses [facility] to render [facility] for INR blood draw.  Pt refuses HHPT.  Referral called to [facility] rep, tim.  Pt has both rolling walker and 3n1 at home.  No other CM needs were communicated. Dellie Catholic, RN 05/01/2015, 10:20 AM

## 2015-05-01 NOTE — Progress Notes (Addendum)
     Subjective: 1 Day Post-Op Procedure(s) (LRB): LEFT TOTAL HIP ARTHROPLASTY ANTERIOR APPROACH (Left)   Seen by Dr. Charlann Boxer. Patient reports pain as mild, pain controlled. No events throughout the night.  Ready to be discharged home if he does well with PT and pain stays controlled.   Objective:   VITALS:   Filed Vitals:   05/01/15 0211 05/01/15 0621  BP: 129/80 114/65  Pulse: 87 81  Temp: 98.3 F (36.8 C) 98.4 F (36.9 C)  Resp: 17 17    Dorsiflexion/Plantar flexion intact Incision: dressing C/D/I No cellulitis present Compartment soft  LABS  Recent Labs  04/30/15 1347 05/01/15 0455  HGB 11.4* 11.7*  HCT 34.5* 35.2*  WBC 6.3 10.3  PLT 185 210     Recent Labs  04/30/15 1347 05/01/15 0455  NA  --  139  K  --  4.1  BUN  --  13  CREATININE 0.96 0.92  GLUCOSE  --  150*     Assessment/Plan: 1 Day Post-Op Procedure(s) (LRB): LEFT TOTAL HIP ARTHROPLASTY ANTERIOR APPROACH (Left) Foley cath d/c'ed Advance diet Up with therapy D/C IV fluids Discharge home with home health  Follow up in 2 weeks at [facility]. Follow up with OLIN,Peni Rupard D in 2 weeks.  Contact information:  [facility] [address], [address] [place] [address] [phone]    Overweight (BMI 25-29.9) Estimated body mass index is 27.22 kg/(m^2) as calculated from the following:   Height as of this encounter: 5\' 8"  (1.727 m).   Weight as of this encounter: 81.194 kg (179 lb). Patient also counseled that weight may inhibit the healing process Patient counseled that losing weight will help with future health issues  Anticoagulation Discussed Coumadin home dose with Alvino Chapel, from pharmacy She stated that it would be appropriate to resume home dosing at this time, since we are bridging with  Lovenox     Anastasio Auerbach. Hurshell Dino   PAC  05/01/2015, 8:22 AM

## 2015-05-01 NOTE — Evaluation (Signed)
Physical Therapy One Time Evaluation Patient Details Name: [name] MRN: [id number] DOB: November 27, 1945 Today's Date: 05/01/2015   History of Present Illness  Pt is a 70 year old male s/p L DA THA with hx of R THA  Clinical Impression  Patient evaluated by Physical Therapy with no further acute PT needs identified. All education has been completed and the patient has no further questions.  Pt mobilizing well and reports he remembers therapy from previous THA.  Pt provided with HEP handout. See below for any follow-up Physical Therapy or equipment needs. PT is signing off. Thank you for this referral.     Follow Up Recommendations Outpatient PT    Equipment Recommendations  None recommended by PT    Recommendations for Other Services       Precautions / Restrictions Precautions Precautions: None Restrictions Weight Bearing Restrictions: No Other Position/Activity Restrictions: WBAT      Mobility  Bed Mobility               General bed mobility comments: pt up in recliner dressed upon arrival  Transfers Overall transfer level: Needs assistance Equipment used: Rolling walker (2 wheeled) Transfers: Sit to/from Stand Sit to Stand: Supervision            Ambulation/Gait Ambulation/Gait assistance: Supervision Ambulation Distance (Feet): 180 Feet Assistive device: Rolling walker (2 wheeled) Gait Pattern/deviations: Step-through pattern     General Gait Details: pt mobilizing well with RW  Stairs Stairs:  (pt declined performing, states he remembers from previous hip surgery)          Wheelchair Mobility    Modified Rankin (Stroke Patients Only)       Balance                                             Pertinent Vitals/Pain Pain Assessment: 0-10 Pain Score: 2  Pain Location: L hip Pain Descriptors / Indicators: Sore Pain Intervention(s): Limited activity within patient's tolerance;Monitored during session;Premedicated  before session;Repositioned    Home Living Family/patient expects to be discharged to:: Private residence Living Arrangements: Spouse/significant other Available Help at Discharge: Family Type of Home: House Home Access: Stairs to enter   Technical brewer of Steps: 1 and 1 Home Layout: One level Home Equipment: Environmental consultant - 2 wheels      Prior Function Level of Independence: Independent               Hand Dominance        Extremity/Trunk Assessment               Lower Extremity Assessment: LLE deficits/detail   LLE Deficits / Details: 3+/5 hip strength     Communication   Communication: No difficulties  Cognition Arousal/Alertness: Awake/alert Behavior During Therapy: WFL for tasks assessed/performed Overall Cognitive Status: Within Functional Limits for tasks assessed                      General Comments      Exercises Total Joint Exercises Heel Slides: AROM;Supine;Left;10 reps Hip ABduction/ADduction: AROM;Supine;Left;10 reps Long Arc Quad: AROM;Seated;Left;10 Theatre manager in Standing: AROM;Standing;Left;10 reps      Assessment/Plan    PT Assessment All further PT needs can be met in the next venue of care  PT Diagnosis Difficulty walking;Acute pain   PT Problem List Decreased strength  PT Treatment Interventions  PT Goals (Current goals can be found in the Care Plan section) Acute Rehab PT Goals PT Goal Formulation: All assessment and education complete, DC therapy    Frequency     Barriers to discharge        Co-evaluation               End of Session   Activity Tolerance: Patient tolerated treatment well Patient left: in chair;with call bell/phone within reach;with family/visitor present Nurse Communication: Mobility status         Time: 7289-7915 PT Time Calculation (min) (ACUTE ONLY): 8 min   Charges:   PT Evaluation $PT Eval Low Complexity: 1 Procedure     PT G Codes:        Anesha Hackert,KATHrine  E 05/01/2015, 11:34 AM Carmelia Bake, PT, DPT 05/01/2015 Pager: [phone]

## 2015-05-03 ENCOUNTER — Telehealth: Payer: Self-pay | Admitting: Family Medicine

## 2015-05-03 ENCOUNTER — Ambulatory Visit: Payer: BLUE CROSS/BLUE SHIELD

## 2015-05-03 NOTE — Telephone Encounter (Signed)
Received phone call from [facility] health nurse Hope with patient's INR resutls  INR today was: 1.2 PT: 20.1  Patient is currently on 5 mg daily with 1/2 on Wednesday. Patient is post hip replacement surgery and just restarted coumadin.  Reviewed INR with Dr.Scott Luking- Was told to have patient continue same dose of coumadin on same dosaging and recheck on Monday 05/06/15/   Patient to notified and patient verbalized understanding.

## 2015-05-06 ENCOUNTER — Ambulatory Visit (INDEPENDENT_AMBULATORY_CARE_PROVIDER_SITE_OTHER): Payer: BLUE CROSS/BLUE SHIELD | Admitting: *Deleted

## 2015-05-06 DIAGNOSIS — Z7901 Long term (current) use of anticoagulants: Secondary | ICD-10-CM | POA: Diagnosis not present

## 2015-05-06 LAB — POCT INR: INR: 1.8

## 2015-05-06 NOTE — Patient Instructions (Signed)
Coumadin 5mg  tablets. Take one tablet every day. Continue lovenox once in the am until Wednesday. Stop on Thursday.

## 2015-05-09 NOTE — Discharge Summary (Signed)
Physician Discharge Summary  Patient ID: [name] MRN: [id number] DOB/AGE: April 23, 1945 70 y.o.  Admit date: 04/30/2015 Discharge date: 05/01/2015   Procedures:  Procedure(s) (LRB): LEFT TOTAL HIP ARTHROPLASTY ANTERIOR APPROACH (Left)  Attending Physician:  Dr. Durene Romans   Admission Diagnoses:   Left hip AVN / pain  Discharge Diagnoses:  Principal Problem:   S/P left THA, AA Active Problems:   Overweight (BMI 25.0-29.9)  Past Medical History  Diagnosis Date  . DVT (deep venous thrombosis) (HCC) 2009  . Pulmonary embolism (HCC) 2009  . Anticoagulated on Coumadin   . Vertigo     NO RECENT PROBLEMS  . Family history of anesthesia complication     BROTHER HAD MALIGNANT HYPOTHERMIA - PT HAS NOT HAD ANY PROBLEMS WITH ANESTHESIA    . Avascular necrosis of hip (HCC)     LEFT  . History of kidney stones   . Malignant hyperthermia     PT'S BROTHER HAS HX MALIGNANT HYPERTHERMIA    HPI:    [name], 70 y.o. male, has a history of pain and functional disability in the left hip(s) due to trauma, arthritis and AVN and patient has failed non-surgical conservative treatments for greater than 12 weeks to include NSAID's and/or analgesics and activity modification. Onset of symptoms was abrupt starting 03/01/2014 with gradually worsening course since that time.The patient noted no past surgery on the left hip(s). Patient currently rates pain in the left hip at 10 out of 10 with activity. Patient has worsening of pain with activity and weight bearing, trendelenberg gait, pain that interfers with activities of daily living and pain with passive range of motion. Patient has evidence of periarticular osteophytes, joint space narrowing and AVN by imaging studies. This condition presents safety issues increasing the risk of falls. There is no current active infection. Risks, benefits and expectations were discussed with the patient. Risks including but not limited to the risk of  anesthesia, blood clots, nerve damage, blood vessel damage, failure of the prosthesis, infection and up to and including death. Patient understand the risks, benefits and expectations and wishes to proceed with surgery.   PCP: Lubertha South, MD   Discharged Condition: good  Hospital Course:  Patient underwent the above stated procedure on 04/30/2015. Patient tolerated the procedure well and brought to the recovery room in good condition and subsequently to the floor.  POD #1 BP: 114/65 ; Pulse: 81 ; Temp: 98.4 F (36.9 C) ; Resp: 17 Patient reports pain as mild, pain controlled. No events throughout the night. Ready to be discharged home. Dorsiflexion/plantar flexion intact, incision: dressing C/D/I, no cellulitis present and compartment soft.   LABS  Basename    HGB     11.7  HCT     35.2    Discharge Exam: General appearance: alert, cooperative and no distress Extremities: Homans sign is negative, no sign of DVT, no edema, redness or tenderness in the calves or thighs and no ulcers, gangrene or trophic changes  Disposition: Home with follow up in 2 weeks   Follow-up Information    Follow up with Shelda Pal, MD. Schedule an appointment as soon as possible for a visit in 2 weeks.   Specialty:  Orthopedic Surgery   Contact information:   [address] [place] [address] [phone]       Follow up with [facility].   Why:  Home health nurse for INR blood draw   Contact information:   [address] [place] [address] [phone]       Discharge Instructions    Call MD / Call 911    Complete by:  As directed   If you experience chest pain or shortness of breath, CALL 911 and be transported to the hospital emergency room.  If you develope a fever above 101 F, pus (white drainage) or increased drainage or redness at the wound, or calf pain, call your surgeon's office.     Change dressing    Complete by:  As directed    Maintain surgical dressing until follow up in the clinic. If the edges start to pull up, may reinforce with tape. If the dressing is no longer working, may remove and cover with gauze and tape, but must keep the area dry and clean.  Call with any questions or concerns.     Constipation Prevention    Complete by:  As directed   Drink plenty of fluids.  Prune juice may be helpful.  You may use a stool softener, such as Colace (over the counter) 100 mg twice a day.  Use MiraLax (over the counter) for constipation as needed.     Diet - low sodium heart healthy    Complete by:  As directed      Discharge instructions    Complete by:  As directed   Maintain surgical dressing until follow up in the clinic. If the edges start to pull up, may reinforce with tape. If the dressing is no longer working, may remove and cover with gauze and tape, but must keep the area dry and clean.  Follow up in 2 weeks at [facility]. Call with any questions or concerns.     Increase activity slowly as tolerated    Complete by:  As directed   Weight bearing as tolerated with assist device (walker, cane, etc) as directed, use it as long as suggested by your surgeon or therapist, typically at least 4-6 weeks.     TED hose    Complete by:  As directed   Use stockings (TED hose) for 2 weeks on both leg(s).  You may remove them at night for sleeping.             Medication List    STOP taking these medications        oxyCODONE-acetaminophen 5-325 MG tablet  Commonly known as:  PERCOCET/ROXICET      TAKE these medications        acetaminophen 500 MG tablet  Commonly known as:  TYLENOL  Take 2 tablets (1,000 mg total) by mouth every 8 (eight) hours.     docusate sodium 100 MG capsule  Commonly known as:  COLACE  Take 1 capsule (100 mg total) by mouth 2 (two) times daily.     enoxaparin 40 MG/0.4ML injection  Commonly known as:  LOVENOX  Inject 0.4 mLs (40 mg total) into the skin daily.      ferrous sulfate 325 (65 FE) MG tablet  Take 1 tablet (325 mg total) by mouth 3 (three) times daily after meals.     ketoconazole 2 % cream  Commonly known as:  NIZORAL  Apply 1 application topically 2 (two) times daily.     methocarbamol 750 MG tablet  Commonly known as:  ROBAXIN  Take 1 tablet (750 mg total) by mouth every 6 (six) hours as needed for muscle spasms.     oxyCODONE 5 MG immediate release tablet  Commonly known  as:  Oxy IR/ROXICODONE  Take 1-3 tablets (5-15 mg total) by mouth every 4 (four) hours as needed for severe pain.     polyethylene glycol packet  Commonly known as:  MIRALAX / GLYCOLAX  Take 17 g by mouth 2 (two) times daily.     warfarin 5 MG tablet  Commonly known as:  COUMADIN  TAKE ONE AND ONE-HALF TABLETS ON TUESDAY AND 1 TABLET ALL OTHER DAYS. TAKE AS DIRECTED BY PHYSICIAN         Signed: Anastasio Auerbach. Aftan Vint   PA-C  05/09/2015, 10:51 AM

## 2015-05-10 ENCOUNTER — Ambulatory Visit (INDEPENDENT_AMBULATORY_CARE_PROVIDER_SITE_OTHER): Payer: BLUE CROSS/BLUE SHIELD | Admitting: *Deleted

## 2015-05-10 DIAGNOSIS — Z7901 Long term (current) use of anticoagulants: Secondary | ICD-10-CM

## 2015-05-10 LAB — POCT INR: INR: 1.7

## 2015-05-17 ENCOUNTER — Ambulatory Visit (INDEPENDENT_AMBULATORY_CARE_PROVIDER_SITE_OTHER): Payer: BLUE CROSS/BLUE SHIELD | Admitting: *Deleted

## 2015-05-17 DIAGNOSIS — Z7901 Long term (current) use of anticoagulants: Secondary | ICD-10-CM

## 2015-05-17 LAB — POCT INR: INR: 2.7

## 2015-05-17 NOTE — Patient Instructions (Signed)
Take coumadin 5mg  one tablet every day. Recheck INR in 2 weeks

## 2015-05-31 ENCOUNTER — Ambulatory Visit (INDEPENDENT_AMBULATORY_CARE_PROVIDER_SITE_OTHER): Payer: BLUE CROSS/BLUE SHIELD

## 2015-05-31 DIAGNOSIS — Z7901 Long term (current) use of anticoagulants: Secondary | ICD-10-CM

## 2015-05-31 LAB — POCT INR: INR: 3.4

## 2015-05-31 NOTE — Patient Instructions (Signed)
Hold off on 05/31/15. Resume Saturday 06/01/15. Take 1/2 tablet on Tuesdays and saturdays and 1 whole tablet on all other days. Recheck around 06/14/15

## 2015-06-14 ENCOUNTER — Ambulatory Visit (INDEPENDENT_AMBULATORY_CARE_PROVIDER_SITE_OTHER): Payer: BLUE CROSS/BLUE SHIELD | Admitting: *Deleted

## 2015-06-14 DIAGNOSIS — Z7901 Long term (current) use of anticoagulants: Secondary | ICD-10-CM | POA: Diagnosis not present

## 2015-06-14 LAB — POCT INR: INR: 2.4

## 2015-06-14 NOTE — Patient Instructions (Signed)
Take coumadin 5mg  tablets one whole tablet every day except on tuesdays and saturdays. Take one half tablet. Recheck INR in 4 weeks.

## 2015-06-24 ENCOUNTER — Other Ambulatory Visit: Payer: Self-pay | Admitting: *Deleted

## 2015-06-24 ENCOUNTER — Telehealth: Payer: Self-pay | Admitting: Family Medicine

## 2015-06-24 MED ORDER — METHOCARBAMOL 750 MG PO TABS: 750.0000 mg | ORAL_TABLET | Freq: Four times a day (QID) | ORAL | Status: DC | PRN

## 2015-06-24 NOTE — Telephone Encounter (Signed)
Ok three ref 

## 2015-06-24 NOTE — Telephone Encounter (Signed)
methocarbamol (ROBAXIN) 750 MG tablet  Refill please to [facility]

## 2015-06-24 NOTE — Telephone Encounter (Signed)
[facility] [place]. Med sent to pharm.

## 2015-06-25 ENCOUNTER — Other Ambulatory Visit: Payer: Self-pay

## 2015-06-25 MED ORDER — METHOCARBAMOL 750 MG PO TABS: 750.0000 mg | ORAL_TABLET | Freq: Four times a day (QID) | ORAL | Status: DC | PRN

## 2015-07-12 ENCOUNTER — Ambulatory Visit (INDEPENDENT_AMBULATORY_CARE_PROVIDER_SITE_OTHER): Payer: BLUE CROSS/BLUE SHIELD | Admitting: *Deleted

## 2015-07-12 DIAGNOSIS — Z7901 Long term (current) use of anticoagulants: Secondary | ICD-10-CM

## 2015-07-12 LAB — POCT INR: INR: 2.2

## 2015-07-12 NOTE — Patient Instructions (Signed)
Coumadin 5 mg. Take one tablet every day except on tues and saturdays take one half tablet. Recheck INR in 4 weeks.

## 2015-08-09 ENCOUNTER — Ambulatory Visit (INDEPENDENT_AMBULATORY_CARE_PROVIDER_SITE_OTHER): Payer: BLUE CROSS/BLUE SHIELD | Admitting: *Deleted

## 2015-08-09 DIAGNOSIS — Z7901 Long term (current) use of anticoagulants: Secondary | ICD-10-CM | POA: Diagnosis not present

## 2015-08-09 LAB — POCT INR: INR: 1.9

## 2015-08-09 MED ORDER — WARFARIN SODIUM 5 MG PO TABS: ORAL_TABLET | ORAL | Status: DC

## 2015-08-09 NOTE — Patient Instructions (Signed)
Coumadin 5mg  tablets. Take one tablet every day except on Saturday take one half tablet. Recheck INR in 4 weeks.

## 2015-09-06 ENCOUNTER — Ambulatory Visit: Payer: BLUE CROSS/BLUE SHIELD

## 2015-09-10 ENCOUNTER — Ambulatory Visit (INDEPENDENT_AMBULATORY_CARE_PROVIDER_SITE_OTHER): Payer: BLUE CROSS/BLUE SHIELD

## 2015-09-10 DIAGNOSIS — Z7901 Long term (current) use of anticoagulants: Secondary | ICD-10-CM

## 2015-09-10 LAB — POCT INR: INR: 2.8

## 2015-10-08 ENCOUNTER — Ambulatory Visit (INDEPENDENT_AMBULATORY_CARE_PROVIDER_SITE_OTHER): Payer: BLUE CROSS/BLUE SHIELD

## 2015-10-08 DIAGNOSIS — Z7901 Long term (current) use of anticoagulants: Secondary | ICD-10-CM | POA: Diagnosis not present

## 2015-10-08 LAB — POCT INR: INR: 2.4

## 2015-11-05 ENCOUNTER — Ambulatory Visit (INDEPENDENT_AMBULATORY_CARE_PROVIDER_SITE_OTHER): Payer: BLUE CROSS/BLUE SHIELD

## 2015-11-05 DIAGNOSIS — Z7901 Long term (current) use of anticoagulants: Secondary | ICD-10-CM | POA: Diagnosis not present

## 2015-11-05 LAB — POCT INR: INR: 2.8

## 2015-12-03 ENCOUNTER — Ambulatory Visit (INDEPENDENT_AMBULATORY_CARE_PROVIDER_SITE_OTHER): Payer: BLUE CROSS/BLUE SHIELD

## 2015-12-03 DIAGNOSIS — Z7901 Long term (current) use of anticoagulants: Secondary | ICD-10-CM | POA: Diagnosis not present

## 2015-12-03 LAB — POCT INR: INR: 3.4

## 2015-12-31 ENCOUNTER — Ambulatory Visit (INDEPENDENT_AMBULATORY_CARE_PROVIDER_SITE_OTHER): Payer: BLUE CROSS/BLUE SHIELD

## 2015-12-31 DIAGNOSIS — Z7901 Long term (current) use of anticoagulants: Secondary | ICD-10-CM

## 2015-12-31 LAB — POCT INR: INR: 2.2

## 2016-01-28 ENCOUNTER — Ambulatory Visit: Payer: BLUE CROSS/BLUE SHIELD

## 2016-01-29 ENCOUNTER — Ambulatory Visit (INDEPENDENT_AMBULATORY_CARE_PROVIDER_SITE_OTHER): Payer: BLUE CROSS/BLUE SHIELD

## 2016-01-29 DIAGNOSIS — Z7901 Long term (current) use of anticoagulants: Secondary | ICD-10-CM | POA: Diagnosis not present

## 2016-01-29 LAB — POCT INR: INR: 2

## 2016-02-03 ENCOUNTER — Telehealth: Payer: Self-pay | Admitting: Family Medicine

## 2016-02-03 DIAGNOSIS — Z0000 Encounter for general adult medical examination without abnormal findings: Secondary | ICD-10-CM

## 2016-02-03 DIAGNOSIS — Z13220 Encounter for screening for lipoid disorders: Secondary | ICD-10-CM

## 2016-02-03 NOTE — Telephone Encounter (Signed)
Pt is requesting lab orders to be sent over for an upcoming wellness visit. Last labs per epic were: psa,bmp,hepatic,and lipid,on 12/31/14.

## 2016-02-04 NOTE — Telephone Encounter (Signed)
Left message return call 02/04/16 (labs ordered)

## 2016-02-04 NOTE — Telephone Encounter (Signed)
Pt.notified

## 2016-02-04 NOTE — Telephone Encounter (Signed)
Repeat same 

## 2016-02-26 ENCOUNTER — Ambulatory Visit (INDEPENDENT_AMBULATORY_CARE_PROVIDER_SITE_OTHER): Payer: BLUE CROSS/BLUE SHIELD | Admitting: *Deleted

## 2016-02-26 DIAGNOSIS — Z7901 Long term (current) use of anticoagulants: Secondary | ICD-10-CM

## 2016-02-26 LAB — POCT INR: INR: 2.2

## 2016-02-26 NOTE — Patient Instructions (Signed)
Continue the same on coumadin. Recheck INR in 4 weeks

## 2016-03-10 LAB — HEPATIC FUNCTION PANEL
ALK PHOS: 59 IU/L (ref 39–117)
ALT: 74 IU/L — AB (ref 0–44)
AST: 47 IU/L — AB (ref 0–40)
Albumin: 4.2 g/dL (ref 3.6–4.8)
BILIRUBIN, DIRECT: 0.1 mg/dL (ref 0.00–0.40)
Bilirubin Total: 0.4 mg/dL (ref 0.0–1.2)
Total Protein: 6.3 g/dL (ref 6.0–8.5)

## 2016-03-10 LAB — BASIC METABOLIC PANEL
BUN / CREAT RATIO: 14 (ref 10–24)
BUN: 13 mg/dL (ref 8–27)
CO2: 26 mmol/L (ref 18–29)
CREATININE: 0.9 mg/dL (ref 0.76–1.27)
Calcium: 8.6 mg/dL (ref 8.6–10.2)
Chloride: 103 mmol/L (ref 96–106)
GFR calc non Af Amer: 87 mL/min/{1.73_m2} (ref >59)
GFR, EST AFRICAN AMERICAN: 100 mL/min/{1.73_m2} (ref >59)
GLUCOSE: 93 mg/dL (ref 65–99)
Potassium: 3.9 mmol/L (ref 3.5–5.2)
SODIUM: 142 mmol/L (ref 134–144)

## 2016-03-10 LAB — PSA: Prostate Specific Ag, Serum: 1.5 ng/mL (ref 0.0–4.0)

## 2016-03-10 LAB — LIPID PANEL
CHOLESTEROL TOTAL: 167 mg/dL (ref 100–199)
Chol/HDL Ratio: 5.2 ratio units — ABNORMAL HIGH (ref 0.0–5.0)
HDL: 32 mg/dL — ABNORMAL LOW (ref >39)
LDL Calculated: 102 mg/dL — ABNORMAL HIGH (ref 0–99)
TRIGLYCERIDES: 165 mg/dL — AB (ref 0–149)
VLDL Cholesterol Cal: 33 mg/dL (ref 5–40)

## 2016-03-13 ENCOUNTER — Encounter: Payer: Self-pay | Admitting: Family Medicine

## 2016-03-13 ENCOUNTER — Ambulatory Visit (INDEPENDENT_AMBULATORY_CARE_PROVIDER_SITE_OTHER): Payer: BLUE CROSS/BLUE SHIELD | Admitting: Family Medicine

## 2016-03-13 VITALS — Ht 66.75 in | Wt 177.6 lb

## 2016-03-13 DIAGNOSIS — R748 Abnormal levels of other serum enzymes: Secondary | ICD-10-CM | POA: Diagnosis not present

## 2016-03-13 DIAGNOSIS — Z0000 Encounter for general adult medical examination without abnormal findings: Secondary | ICD-10-CM

## 2016-03-13 DIAGNOSIS — Z23 Encounter for immunization: Secondary | ICD-10-CM

## 2016-03-13 DIAGNOSIS — Z7901 Long term (current) use of anticoagulants: Secondary | ICD-10-CM

## 2016-03-13 LAB — POCT INR: INR: 3

## 2016-03-13 NOTE — Progress Notes (Signed)
   Subjective:    Patient ID: [name]., male    DOB: [birth year], 70 y.o.   MRN: [id number]  HPI AWV- Annual Wellness Visit  The patient was seen for their annual wellness visit. The patient's past medical history, surgical history, and family history were reviewed. Pertinent vaccines were reviewed ( tetanus, pneumonia, shingles, flu) The patient's medication list was reviewed and updated.  The height and weight were entered. The patient's current BMI is: 28.02  Cognitive screening was completed. Outcome of Mini - Cog: pass  Falls within the past 6 months: none  Current tobacco usage: none (All patients who use tobacco were given written and verbal information on quitting)  Recent listing of emergency department/hospitalizations over the past year were reviewed.  current specialist the patient sees on a regular basis: none   Medicare annual wellness visit patient questionnaire was reviewed.  A written screening schedule for the patient for the next 5-10 years was given. Appropriate discussion of followup regarding next visit was discussed.  INR 3.0 today.   Flu shot rec   Results for orders placed or performed in visit on 03/13/16  POCT INR  Result Value Ref Range   INR 3.0       Review of Systems  Constitutional: Negative for activity change, appetite change and fever.  HENT: Negative for congestion and rhinorrhea.   Eyes: Negative for discharge.  Respiratory: Negative for cough and wheezing.   Cardiovascular: Negative for chest pain.  Gastrointestinal: Negative for abdominal pain, blood in stool and vomiting.  Genitourinary: Negative for difficulty urinating and frequency.  Musculoskeletal: Negative for neck pain.  Skin: Negative for rash.  Allergic/Immunologic: Negative for environmental allergies and food allergies.  Neurological: Negative for weakness and headaches.  Psychiatric/Behavioral: Negative for agitation.  All other systems reviewed and are  negative.      Objective:   Physical Exam  Constitutional: He appears well-developed and well-nourished.  HENT:  Head: Normocephalic and atraumatic.  Right Ear: External ear normal.  Left Ear: External ear normal.  Nose: Nose normal.  Mouth/Throat: Oropharynx is clear and moist.  Eyes: EOM are normal. Pupils are equal, round, and reactive to light.  Neck: Normal range of motion. Neck supple. No thyromegaly present.  Cardiovascular: Normal rate, regular rhythm and normal heart sounds.   No murmur heard. Pulmonary/Chest: Effort normal and breath sounds normal. No respiratory distress. He has no wheezes.  Abdominal: Soft. Bowel sounds are normal. He exhibits no distension and no mass. There is no tenderness.  Genitourinary: Penis normal.  Musculoskeletal: Normal range of motion. He exhibits no edema.  Lymphadenopathy:    He has no cervical adenopathy.  Neurological: He is alert. He exhibits normal muscle tone.  Skin: Skin is warm and dry. No erythema.  Psychiatric: He has a normal mood and affect. His behavior is normal. Judgment normal.  Vitals reviewed.         Assessment & Plan:  Impression 1 wellness exam #2 chronic anticoagulation. Long discussion held. Asian still needs it long-term. Prefers to stick with Coumadin. INR today good level to maintain same #3 elevated liver enzymes. Likely fatty liver. First time we've seen a. Recommend repeat blood work in 3 months. If still elevated will need standard blood work and ultrasound discussed. plan pneumonia shot. Flu shot. Diet exercise discussed. Maintain same INR dose

## 2016-03-13 NOTE — Patient Instructions (Addendum)
Coumadin 5mg  tablets. Take one tablet daily except on tuesdays and saturdays take one half tablet. Recheck inr in 4 weeks  Results for orders placed or performed in visit on 03/13/16  POCT INR  Result Value Ref Range   INR 3.0

## 2016-04-10 ENCOUNTER — Ambulatory Visit: Payer: BLUE CROSS/BLUE SHIELD

## 2016-04-10 ENCOUNTER — Ambulatory Visit (INDEPENDENT_AMBULATORY_CARE_PROVIDER_SITE_OTHER): Payer: BLUE CROSS/BLUE SHIELD | Admitting: *Deleted

## 2016-04-10 DIAGNOSIS — Z7901 Long term (current) use of anticoagulants: Secondary | ICD-10-CM

## 2016-04-10 LAB — POCT INR: INR: 2.2

## 2016-05-08 ENCOUNTER — Ambulatory Visit (INDEPENDENT_AMBULATORY_CARE_PROVIDER_SITE_OTHER): Payer: BLUE CROSS/BLUE SHIELD | Admitting: *Deleted

## 2016-05-08 DIAGNOSIS — Z7901 Long term (current) use of anticoagulants: Secondary | ICD-10-CM | POA: Diagnosis not present

## 2016-05-08 LAB — POCT INR: INR: 2.4

## 2016-06-02 LAB — HEPATIC FUNCTION PANEL
ALK PHOS: 60 IU/L (ref 39–117)
ALT: 43 IU/L (ref 0–44)
AST: 30 IU/L (ref 0–40)
Albumin: 4.3 g/dL (ref 3.6–4.8)
BILIRUBIN TOTAL: 0.5 mg/dL (ref 0.0–1.2)
BILIRUBIN, DIRECT: 0.1 mg/dL (ref 0.00–0.40)
Total Protein: 6.9 g/dL (ref 6.0–8.5)

## 2016-06-08 ENCOUNTER — Ambulatory Visit (INDEPENDENT_AMBULATORY_CARE_PROVIDER_SITE_OTHER): Payer: BLUE CROSS/BLUE SHIELD | Admitting: Family Medicine

## 2016-06-08 ENCOUNTER — Encounter: Payer: Self-pay | Admitting: Family Medicine

## 2016-06-08 VITALS — BP 138/82 | Ht 66.75 in | Wt 182.2 lb

## 2016-06-08 DIAGNOSIS — R748 Abnormal levels of other serum enzymes: Secondary | ICD-10-CM

## 2016-06-08 DIAGNOSIS — Z7901 Long term (current) use of anticoagulants: Secondary | ICD-10-CM | POA: Diagnosis not present

## 2016-06-08 LAB — POCT INR: INR: 2.7

## 2016-06-08 NOTE — Progress Notes (Signed)
Subjective:     Patient ID: [name]., male   DOB: Sep 23, 1945, 71 y.o.   MRN: [id number]  HPI Results for orders placed or performed in visit on 06/08/16  POCT INR  Result Value Ref Range   INR 2.7    Recent Results (from the past 2160 hour(s))  POCT INR     Status: None   Collection Time: 03/13/16  9:39 AM  Result Value Ref Range   INR 3.0   POCT INR     Status: None   Collection Time: 04/10/16  9:25 AM  Result Value Ref Range   INR 2.2   POCT INR     Status: None   Collection Time: 05/08/16  8:58 AM  Result Value Ref Range   INR 2.4   Hepatic function panel     Status: None   Collection Time: 06/01/16  8:11 AM  Result Value Ref Range   Total Protein 6.9 6.0 - 8.5 g/dL   Albumin 4.3 3.6 - 4.8 g/dL   Bilirubin Total 0.5 0.0 - 1.2 mg/dL   Bilirubin, Direct 9.37 0.00 - 0.40 mg/dL   Alkaline Phosphatase 60 39 - 117 IU/L   AST 30 0 - 40 IU/L   ALT 43 0 - 44 IU/L  POCT INR     Status: None   Collection Time: 06/08/16  9:36 AM  Result Value Ref Range   INR 2.7    Has tried to cut fat down in the diet  174 in pajamas,   Patient is works hard on cutting down fats in his diet. He thinks he has lost some weight. States one weight today had heavy boots on code etc.  stil walking a fair amnt    Review of Systems No headache, no major weight loss or weight gain, no chest pain no back pain abdominal pain no change in bowel habits complete ROS otherwise negative     Objective:   Physical Exam Alert vitals stable, NAD. Blood pressure good on repeat. HEENT normal. Lungs clear. Heart regular rate and rhythm.     Assessment:    impression elevated liver enzymes now resolved. No need for mega-workup at this time. Coumadin in good control with INR 2.7. Follow-up with monthly INR INRs keep working hard on diet and exercise     Plan:

## 2016-07-10 ENCOUNTER — Ambulatory Visit (INDEPENDENT_AMBULATORY_CARE_PROVIDER_SITE_OTHER): Payer: BLUE CROSS/BLUE SHIELD

## 2016-07-10 DIAGNOSIS — Z7901 Long term (current) use of anticoagulants: Secondary | ICD-10-CM | POA: Diagnosis not present

## 2016-07-10 LAB — POCT INR: INR: 2.1

## 2016-07-10 NOTE — Patient Instructions (Signed)
Take 1 whole tablets all days (5 mg) except on Tuesdays and Saturdays take 1/2 tablet (2.5 MG).

## 2016-08-07 ENCOUNTER — Ambulatory Visit (INDEPENDENT_AMBULATORY_CARE_PROVIDER_SITE_OTHER): Payer: BLUE CROSS/BLUE SHIELD | Admitting: *Deleted

## 2016-08-07 DIAGNOSIS — Z7901 Long term (current) use of anticoagulants: Secondary | ICD-10-CM | POA: Diagnosis not present

## 2016-08-07 LAB — POCT INR: INR: 2.5

## 2016-08-11 ENCOUNTER — Other Ambulatory Visit: Payer: Self-pay | Admitting: Family Medicine

## 2016-09-03 ENCOUNTER — Ambulatory Visit (INDEPENDENT_AMBULATORY_CARE_PROVIDER_SITE_OTHER): Payer: BLUE CROSS/BLUE SHIELD | Admitting: *Deleted

## 2016-09-03 DIAGNOSIS — Z7901 Long term (current) use of anticoagulants: Secondary | ICD-10-CM

## 2016-09-03 LAB — POCT INR: INR: 2.2

## 2016-09-04 ENCOUNTER — Ambulatory Visit: Payer: BLUE CROSS/BLUE SHIELD

## 2016-10-01 ENCOUNTER — Ambulatory Visit (INDEPENDENT_AMBULATORY_CARE_PROVIDER_SITE_OTHER): Payer: BLUE CROSS/BLUE SHIELD | Admitting: *Deleted

## 2016-10-01 DIAGNOSIS — Z7901 Long term (current) use of anticoagulants: Secondary | ICD-10-CM

## 2016-10-01 LAB — POCT INR: INR: 2.2

## 2016-10-01 MED ORDER — AMOXICILLIN 500 MG PO CAPS: ORAL_CAPSULE | ORAL | 0 refills | Status: DC

## 2016-10-01 NOTE — Patient Instructions (Signed)
Take coumadin 5mg  one every day except one half on tuesdays and saturdays. Recheck INR in 4 weeks.

## 2016-10-29 ENCOUNTER — Ambulatory Visit (INDEPENDENT_AMBULATORY_CARE_PROVIDER_SITE_OTHER): Payer: BLUE CROSS/BLUE SHIELD | Admitting: *Deleted

## 2016-10-29 DIAGNOSIS — Z7901 Long term (current) use of anticoagulants: Secondary | ICD-10-CM | POA: Diagnosis not present

## 2016-10-29 LAB — POCT INR: INR: 2.6

## 2016-11-26 ENCOUNTER — Ambulatory Visit (INDEPENDENT_AMBULATORY_CARE_PROVIDER_SITE_OTHER): Payer: BLUE CROSS/BLUE SHIELD | Admitting: *Deleted

## 2016-11-26 DIAGNOSIS — Z7901 Long term (current) use of anticoagulants: Secondary | ICD-10-CM | POA: Diagnosis not present

## 2016-11-26 LAB — POCT INR: INR: 1.6

## 2016-12-10 ENCOUNTER — Telehealth: Payer: Self-pay | Admitting: Family Medicine

## 2016-12-10 ENCOUNTER — Ambulatory Visit (INDEPENDENT_AMBULATORY_CARE_PROVIDER_SITE_OTHER): Payer: BLUE CROSS/BLUE SHIELD | Admitting: *Deleted

## 2016-12-10 DIAGNOSIS — Z7901 Long term (current) use of anticoagulants: Secondary | ICD-10-CM | POA: Diagnosis not present

## 2016-12-10 LAB — POCT INR: INR: 3.4

## 2016-12-10 NOTE — Telephone Encounter (Signed)
Patient is wanting to know if he needs to stop his coumadin before his eye surgery on Oct.9. He see doctor on Oct.1 for consultation.

## 2016-12-10 NOTE — Telephone Encounter (Signed)
Spoke with patient and patient stated that he is having cataract laser eye surgery. Please advise?

## 2016-12-10 NOTE — Telephone Encounter (Signed)
What type of eye surgery?

## 2016-12-10 NOTE — Patient Instructions (Signed)
Take coumadin 5mg  one half on wednesdays and saturdays and one tablet all other days. Recheck INR in 4 weeks.

## 2016-12-11 NOTE — Telephone Encounter (Signed)
Will need to hold coumadin five d prior to surg, and use lovenox, i'll figure out his regimen this weekend

## 2016-12-11 NOTE — Telephone Encounter (Signed)
Spoke with patient and informed him per Dr.Steve Luking- Will need to hold coumadin 5 days prior to surgery. And use Lovenox. Dr.Steve will figure out the regimen this weekend.

## 2016-12-14 NOTE — Telephone Encounter (Signed)
Patient advised  Five days before the surgery stop the Coumadin.  On the same day, 5 days prior to surgery, start the twice per day Lovenox injections as directed-Lovenox 80 mg/0.66ml-inject 0.10ml(80 mg) into skin twice a day  The day before surgery be sure to have an INR to document normalization. Either we can do this or sometimes the specialists order this.  Take the last lovenox dose the morning before the surgery day  Day of surgery take nothing  The day following surgery, resume twice per day Lovenox and resume coumadin daily at usual dose  Three days after surgery, come to our office for an INR for further recommendations     Patient verbalized understanding and stated he goes for his pre op on 12/21/16 and wants [place] to wait to send in the Lovenox until he calls [place] after that appointment when they confirm the surgery date.

## 2016-12-14 NOTE — Telephone Encounter (Addendum)
      Five days before the surgery stop the Coumadin.  On the same day, 5 days prior to surgery, start the twice per day Lovenox injections as directed-Lovenox 80 mg/0.59ml-inject 0.12ml(80 mg) into skin twice a day  The day before surgery be sure to have an INR to document normalization. Either we can do this or sometimes the specialists order this.  Take the last lovenox dose the morning before the surgery day  Day of surgery take nothing  The day following surgery, resume twice per day Lovenox and resume coumadin daily at usual dose  Three days after surgery, come to our office for an INR for further recommendations

## 2016-12-14 NOTE — Telephone Encounter (Signed)
See the phone message from January 12th, 2017, give the pt the exact same set of instructions and, call in same dose and amnt of lovenox as we did then

## 2016-12-14 NOTE — Addendum Note (Signed)
Addended by: Margaretha Sheffield on: 12/14/2016 03:17 PM   Modules accepted: Orders

## 2016-12-21 DIAGNOSIS — H401124 Primary open-angle glaucoma, left eye, indeterminate stage: Secondary | ICD-10-CM | POA: Diagnosis not present

## 2016-12-21 DIAGNOSIS — H401134 Primary open-angle glaucoma, bilateral, indeterminate stage: Secondary | ICD-10-CM | POA: Diagnosis not present

## 2016-12-21 DIAGNOSIS — H25013 Cortical age-related cataract, bilateral: Secondary | ICD-10-CM | POA: Diagnosis not present

## 2016-12-21 DIAGNOSIS — H2511 Age-related nuclear cataract, right eye: Secondary | ICD-10-CM | POA: Diagnosis not present

## 2016-12-21 DIAGNOSIS — H25011 Cortical age-related cataract, right eye: Secondary | ICD-10-CM | POA: Diagnosis not present

## 2016-12-21 DIAGNOSIS — H35033 Hypertensive retinopathy, bilateral: Secondary | ICD-10-CM | POA: Diagnosis not present

## 2016-12-21 DIAGNOSIS — H2513 Age-related nuclear cataract, bilateral: Secondary | ICD-10-CM | POA: Diagnosis not present

## 2016-12-21 DIAGNOSIS — H401114 Primary open-angle glaucoma, right eye, indeterminate stage: Secondary | ICD-10-CM | POA: Diagnosis not present

## 2016-12-22 ENCOUNTER — Telehealth: Payer: Self-pay | Admitting: Family Medicine

## 2016-12-22 NOTE — Telephone Encounter (Signed)
Pt called stating that the surgeon that is doing his cataract surgery doesn't want him to go on the Lovenox shot. Pt states that the Dr states that there is no bleeding and that there is more risks if he was to come off and on the warfarin. Pt states that the Dr is suppose to be calling Dr. Brett Canales regarding this. The name of the Dr is Dr. Elmer Picker. The phone number is [phone]. Please advise.

## 2016-12-23 NOTE — Telephone Encounter (Signed)
Patient advised that Dr [facility] office contacted [place], this is the surgeons's call, encourage pt in furture before calling us to arrange bridge therapy make sure the specialist for a given procedure want blood thinners stopped. Patient verbalized understanding.

## 2016-12-23 NOTE — Telephone Encounter (Signed)
Let pt know they contacted [place], this is the surgeons's call, encourage pt in furture before calling us to arrange bridge therapy make sure the specialist for a given procedure want blood thinners stopped

## 2016-12-29 DIAGNOSIS — H268 Other specified cataract: Secondary | ICD-10-CM | POA: Diagnosis not present

## 2016-12-29 DIAGNOSIS — H2511 Age-related nuclear cataract, right eye: Secondary | ICD-10-CM | POA: Diagnosis not present

## 2017-01-07 ENCOUNTER — Ambulatory Visit (INDEPENDENT_AMBULATORY_CARE_PROVIDER_SITE_OTHER): Payer: BLUE CROSS/BLUE SHIELD

## 2017-01-07 DIAGNOSIS — Z7901 Long term (current) use of anticoagulants: Secondary | ICD-10-CM

## 2017-01-07 LAB — POCT INR: INR: 2.7

## 2017-01-07 NOTE — Patient Instructions (Addendum)
Take 1 whole tablet on all days except Wednesdays, and Fridays take 1/2 tablet recheck on 02/04/17.

## 2017-01-18 DIAGNOSIS — H25012 Cortical age-related cataract, left eye: Secondary | ICD-10-CM | POA: Diagnosis not present

## 2017-01-18 DIAGNOSIS — H2512 Age-related nuclear cataract, left eye: Secondary | ICD-10-CM | POA: Diagnosis not present

## 2017-01-26 DIAGNOSIS — H2512 Age-related nuclear cataract, left eye: Secondary | ICD-10-CM | POA: Diagnosis not present

## 2017-01-26 DIAGNOSIS — H25812 Combined forms of age-related cataract, left eye: Secondary | ICD-10-CM | POA: Diagnosis not present

## 2017-02-04 ENCOUNTER — Ambulatory Visit: Payer: BLUE CROSS/BLUE SHIELD

## 2017-02-04 DIAGNOSIS — Z7901 Long term (current) use of anticoagulants: Secondary | ICD-10-CM

## 2017-02-04 NOTE — Patient Instructions (Signed)
Take 1/2 tablet on Wednesdays, and Saturdays and take 1 whole tablet on all other days.

## 2017-03-04 ENCOUNTER — Ambulatory Visit (INDEPENDENT_AMBULATORY_CARE_PROVIDER_SITE_OTHER): Payer: BLUE CROSS/BLUE SHIELD | Admitting: *Deleted

## 2017-03-04 DIAGNOSIS — Z7901 Long term (current) use of anticoagulants: Secondary | ICD-10-CM | POA: Diagnosis not present

## 2017-03-04 LAB — POCT INR: INR: 1.7

## 2017-04-01 ENCOUNTER — Ambulatory Visit (INDEPENDENT_AMBULATORY_CARE_PROVIDER_SITE_OTHER): Payer: BLUE CROSS/BLUE SHIELD | Admitting: *Deleted

## 2017-04-01 DIAGNOSIS — Z7901 Long term (current) use of anticoagulants: Secondary | ICD-10-CM | POA: Diagnosis not present

## 2017-04-01 LAB — POCT INR: INR: 2.2

## 2017-04-01 NOTE — Patient Instructions (Signed)
coumadin 5mg  tablets. Take one tablet every day except on saturdays take one half tablet. Recheck INR in 4 weeks.

## 2017-04-09 ENCOUNTER — Other Ambulatory Visit: Payer: Self-pay

## 2017-04-09 ENCOUNTER — Telehealth: Payer: Self-pay | Admitting: *Deleted

## 2017-04-09 ENCOUNTER — Encounter (HOSPITAL_COMMUNITY)
Admission: EM | Disposition: A | Discharge status: Home / Self Care | Payer: Self-pay | Source: Home / Self Care | Attending: Cardiology

## 2017-04-09 ENCOUNTER — Emergency Department (HOSPITAL_COMMUNITY): Payer: BLUE CROSS/BLUE SHIELD

## 2017-04-09 ENCOUNTER — Encounter (HOSPITAL_COMMUNITY): Payer: Self-pay | Admitting: Emergency Medicine

## 2017-04-09 ENCOUNTER — Inpatient Hospital Stay (HOSPITAL_COMMUNITY)
Admission: EM | Admit: 2017-04-09 | Discharge: 2017-04-13 | DRG: 247 | Disposition: A | Discharge status: Home / Self Care | Payer: BLUE CROSS/BLUE SHIELD | Attending: Cardiology | Admitting: Cardiology

## 2017-04-09 DIAGNOSIS — Z888 Allergy status to other drugs, medicaments and biological substances status: Secondary | ICD-10-CM

## 2017-04-09 DIAGNOSIS — Z833 Family history of diabetes mellitus: Secondary | ICD-10-CM | POA: Diagnosis not present

## 2017-04-09 DIAGNOSIS — Z981 Arthrodesis status: Secondary | ICD-10-CM

## 2017-04-09 DIAGNOSIS — E663 Overweight: Secondary | ICD-10-CM | POA: Diagnosis not present

## 2017-04-09 DIAGNOSIS — Z86711 Personal history of pulmonary embolism: Secondary | ICD-10-CM

## 2017-04-09 DIAGNOSIS — E785 Hyperlipidemia, unspecified: Secondary | ICD-10-CM | POA: Diagnosis not present

## 2017-04-09 DIAGNOSIS — Z87442 Personal history of urinary calculi: Secondary | ICD-10-CM | POA: Diagnosis not present

## 2017-04-09 DIAGNOSIS — R079 Chest pain, unspecified: Secondary | ICD-10-CM | POA: Diagnosis not present

## 2017-04-09 DIAGNOSIS — Z96641 Presence of right artificial hip joint: Secondary | ICD-10-CM | POA: Diagnosis not present

## 2017-04-09 DIAGNOSIS — Z86718 Personal history of other venous thrombosis and embolism: Secondary | ICD-10-CM | POA: Diagnosis not present

## 2017-04-09 DIAGNOSIS — Z955 Presence of coronary angioplasty implant and graft: Secondary | ICD-10-CM

## 2017-04-09 DIAGNOSIS — Z803 Family history of malignant neoplasm of breast: Secondary | ICD-10-CM | POA: Diagnosis not present

## 2017-04-09 DIAGNOSIS — Z6825 Body mass index (BMI) 25.0-25.9, adult: Secondary | ICD-10-CM | POA: Diagnosis not present

## 2017-04-09 DIAGNOSIS — Z8349 Family history of other endocrine, nutritional and metabolic diseases: Secondary | ICD-10-CM

## 2017-04-09 DIAGNOSIS — Z7901 Long term (current) use of anticoagulants: Secondary | ICD-10-CM

## 2017-04-09 DIAGNOSIS — Z9861 Coronary angioplasty status: Secondary | ICD-10-CM

## 2017-04-09 DIAGNOSIS — I214 Non-ST elevation (NSTEMI) myocardial infarction: Principal | ICD-10-CM

## 2017-04-09 DIAGNOSIS — Z823 Family history of stroke: Secondary | ICD-10-CM | POA: Diagnosis not present

## 2017-04-09 DIAGNOSIS — I2584 Coronary atherosclerosis due to calcified coronary lesion: Secondary | ICD-10-CM | POA: Diagnosis present

## 2017-04-09 DIAGNOSIS — I2510 Atherosclerotic heart disease of native coronary artery without angina pectoris: Secondary | ICD-10-CM | POA: Diagnosis not present

## 2017-04-09 DIAGNOSIS — Z96642 Presence of left artificial hip joint: Secondary | ICD-10-CM | POA: Diagnosis present

## 2017-04-09 DIAGNOSIS — I083 Combined rheumatic disorders of mitral, aortic and tricuspid valves: Secondary | ICD-10-CM | POA: Diagnosis present

## 2017-04-09 DIAGNOSIS — Z8249 Family history of ischemic heart disease and other diseases of the circulatory system: Secondary | ICD-10-CM | POA: Diagnosis not present

## 2017-04-09 HISTORY — DX: Hyperlipidemia, unspecified: E78.5

## 2017-04-09 HISTORY — DX: Non-ST elevation (NSTEMI) myocardial infarction: I21.4

## 2017-04-09 HISTORY — PX: LEFT HEART CATH AND CORONARY ANGIOGRAPHY: CATH118249

## 2017-04-09 HISTORY — PX: CORONARY STENT INTERVENTION: CATH118234

## 2017-04-09 LAB — BASIC METABOLIC PANEL
Anion gap: 13 (ref 5–15)
BUN: 12 mg/dL (ref 6–20)
CALCIUM: 9.6 mg/dL (ref 8.9–10.3)
CO2: 25 mmol/L (ref 22–32)
CREATININE: 1.08 mg/dL (ref 0.61–1.24)
Chloride: 99 mmol/L — ABNORMAL LOW (ref 101–111)
GFR calc non Af Amer: >60 mL/min (ref >60)
Glucose, Bld: 112 mg/dL — ABNORMAL HIGH (ref 65–99)
Potassium: 3.5 mmol/L (ref 3.5–5.1)
SODIUM: 137 mmol/L (ref 135–145)

## 2017-04-09 LAB — CBC
HCT: 46.5 % (ref 39.0–52.0)
Hemoglobin: 15.8 g/dL (ref 13.0–17.0)
MCH: 29.9 pg (ref 26.0–34.0)
MCHC: 34 g/dL (ref 30.0–36.0)
MCV: 88.1 fL (ref 78.0–100.0)
PLATELETS: 203 10*3/uL (ref 150–400)
RBC: 5.28 MIL/uL (ref 4.22–5.81)
RDW: 12.8 % (ref 11.5–15.5)
WBC: 10.6 10*3/uL — AB (ref 4.0–10.5)

## 2017-04-09 LAB — POCT ACTIVATED CLOTTING TIME
ACTIVATED CLOTTING TIME: 307 s
Activated Clotting Time: 296 seconds

## 2017-04-09 LAB — I-STAT TROPONIN, ED: TROPONIN I, POC: 2.08 ng/mL — AB (ref 0.00–0.08)

## 2017-04-09 LAB — HEMOGLOBIN A1C
Hgb A1c MFr Bld: 5.6 % (ref 4.8–5.6)
Mean Plasma Glucose: 114.02 mg/dL

## 2017-04-09 LAB — TROPONIN I
TROPONIN I: 15.65 ng/mL — AB (ref <0.03)
Troponin I: 3.14 ng/mL (ref <0.03)
Troponin I: 31.37 ng/mL (ref <0.03)

## 2017-04-09 LAB — PROTIME-INR
INR: 1.73
Prothrombin Time: 20.1 seconds — ABNORMAL HIGH (ref 11.4–15.2)

## 2017-04-09 LAB — TSH: TSH: 0.533 u[IU]/mL (ref 0.350–4.500)

## 2017-04-09 SURGERY — LEFT HEART CATH AND CORONARY ANGIOGRAPHY
Anesthesia: LOCAL

## 2017-04-09 MED ORDER — ASPIRIN EC 81 MG PO TBEC: 81.0000 mg | DELAYED_RELEASE_TABLET | Freq: Every day | ORAL | Status: DC

## 2017-04-09 MED ORDER — TICAGRELOR 90 MG PO TABS
ORAL_TABLET | ORAL | Status: DC | PRN
  Administered 2017-04-09: 180 mg via ORAL

## 2017-04-09 MED ORDER — ONDANSETRON HCL 4 MG/2ML IJ SOLN
INTRAMUSCULAR | Status: DC | PRN
  Administered 2017-04-09: 4 mg via INTRAVENOUS

## 2017-04-09 MED ORDER — NITROGLYCERIN 2 % TD OINT
1.0000 [in_us] | TOPICAL_OINTMENT | Freq: Once | TRANSDERMAL | Status: AC
  Administered 2017-04-09: 1 [in_us] via TOPICAL
  Filled 2017-04-09: qty 1

## 2017-04-09 MED ORDER — VERAPAMIL HCL 2.5 MG/ML IV SOLN
INTRAVENOUS | Status: AC
  Filled 2017-04-09: qty 2

## 2017-04-09 MED ORDER — HEPARIN (PORCINE) IN NACL 2-0.9 UNIT/ML-% IJ SOLN
INTRAMUSCULAR | Status: AC
  Filled 2017-04-09: qty 1000

## 2017-04-09 MED ORDER — HEPARIN SODIUM (PORCINE) 1000 UNIT/ML IJ SOLN
INTRAMUSCULAR | Status: AC
  Filled 2017-04-09: qty 1

## 2017-04-09 MED ORDER — ATORVASTATIN CALCIUM 80 MG PO TABS: 80.0000 mg | ORAL_TABLET | Freq: Every day | ORAL | Status: DC

## 2017-04-09 MED ORDER — ONDANSETRON HCL 4 MG/2ML IJ SOLN
INTRAMUSCULAR | Status: AC
  Filled 2017-04-09: qty 2

## 2017-04-09 MED ORDER — LATANOPROST 0.005 % OP SOLN
1.0000 [drp] | Freq: Every day | OPHTHALMIC | Status: DC
  Administered 2017-04-09 – 2017-04-12 (×4): 1 [drp] via OPHTHALMIC
  Filled 2017-04-09 (×2): qty 2.5

## 2017-04-09 MED ORDER — SODIUM CHLORIDE 0.9% FLUSH: 3.0000 mL | INTRAVENOUS | Status: DC | PRN

## 2017-04-09 MED ORDER — ASPIRIN 81 MG PO CHEW
81.0000 mg | CHEWABLE_TABLET | Freq: Every day | ORAL | Status: DC
  Administered 2017-04-10 – 2017-04-13 (×4): 81 mg via ORAL
  Filled 2017-04-09 (×5): qty 1

## 2017-04-09 MED ORDER — TICAGRELOR 90 MG PO TABS
90.0000 mg | ORAL_TABLET | Freq: Two times a day (BID) | ORAL | Status: DC
  Administered 2017-04-10 – 2017-04-12 (×5): 90 mg via ORAL
  Filled 2017-04-09 (×5): qty 1

## 2017-04-09 MED ORDER — HEPARIN (PORCINE) IN NACL 100-0.45 UNIT/ML-% IJ SOLN
900.0000 [IU]/h | INTRAMUSCULAR | Status: DC
  Administered 2017-04-09: 900 [IU]/h via INTRAVENOUS
  Filled 2017-04-09: qty 250

## 2017-04-09 MED ORDER — ASPIRIN 81 MG PO CHEW
324.0000 mg | CHEWABLE_TABLET | Freq: Once | ORAL | Status: AC
  Administered 2017-04-09: 324 mg via ORAL
  Filled 2017-04-09: qty 4

## 2017-04-09 MED ORDER — LIDOCAINE HCL (PF) 1 % IJ SOLN
INTRAMUSCULAR | Status: AC
  Filled 2017-04-09: qty 30

## 2017-04-09 MED ORDER — HEPARIN (PORCINE) IN NACL 2-0.9 UNIT/ML-% IJ SOLN
INTRAMUSCULAR | Status: DC | PRN
  Administered 2017-04-09: 1000 mL

## 2017-04-09 MED ORDER — HEPARIN (PORCINE) IN NACL 100-0.45 UNIT/ML-% IJ SOLN
1400.0000 [IU]/h | INTRAMUSCULAR | Status: DC
  Administered 2017-04-10: 06:00:00 900 [IU]/h via INTRAVENOUS
  Administered 2017-04-10: 1150 [IU]/h via INTRAVENOUS
  Administered 2017-04-11: 1400 [IU]/h via INTRAVENOUS
  Filled 2017-04-09 (×2): qty 250

## 2017-04-09 MED ORDER — NITROGLYCERIN 1 MG/10 ML FOR IR/CATH LAB
INTRA_ARTERIAL | Status: AC
  Filled 2017-04-09: qty 10

## 2017-04-09 MED ORDER — SODIUM CHLORIDE 0.9 % IV SOLN: 250.0000 mL | INTRAVENOUS | Status: DC | PRN

## 2017-04-09 MED ORDER — NITROGLYCERIN 0.4 MG SL SUBL
0.4000 mg | SUBLINGUAL_TABLET | SUBLINGUAL | Status: DC | PRN
Start: 2017-04-09 — End: 2017-04-13

## 2017-04-09 MED ORDER — SODIUM CHLORIDE 0.9 % IV SOLN: INTRAVENOUS | Status: AC

## 2017-04-09 MED ORDER — ONDANSETRON HCL 4 MG/2ML IJ SOLN: 4.0000 mg | Freq: Four times a day (QID) | INTRAMUSCULAR | Status: DC | PRN

## 2017-04-09 MED ORDER — TICAGRELOR 90 MG PO TABS
ORAL_TABLET | ORAL | Status: AC
  Filled 2017-04-09: qty 2

## 2017-04-09 MED ORDER — HYDRALAZINE HCL 20 MG/ML IJ SOLN: 5.0000 mg | INTRAMUSCULAR | Status: AC | PRN

## 2017-04-09 MED ORDER — MIDAZOLAM HCL 2 MG/2ML IJ SOLN
INTRAMUSCULAR | Status: AC
  Filled 2017-04-09: qty 2

## 2017-04-09 MED ORDER — TICAGRELOR 90 MG PO TABS
ORAL_TABLET | ORAL | Status: AC
  Filled 2017-04-09: qty 1

## 2017-04-09 MED ORDER — ACETAMINOPHEN 325 MG PO TABS: 650.0000 mg | ORAL_TABLET | ORAL | Status: DC | PRN

## 2017-04-09 MED ORDER — NITROGLYCERIN 1 MG/10 ML FOR IR/CATH LAB
INTRA_ARTERIAL | Status: DC | PRN
  Administered 2017-04-09 (×2): 200 ug via INTRACORONARY

## 2017-04-09 MED ORDER — SODIUM CHLORIDE 0.9% FLUSH
3.0000 mL | Freq: Two times a day (BID) | INTRAVENOUS | Status: DC
  Administered 2017-04-11: 3 mL via INTRAVENOUS

## 2017-04-09 MED ORDER — FENTANYL CITRATE (PF) 100 MCG/2ML IJ SOLN
INTRAMUSCULAR | Status: AC
  Filled 2017-04-09: qty 2

## 2017-04-09 MED ORDER — CARVEDILOL 3.125 MG PO TABS
6.2500 mg | ORAL_TABLET | Freq: Two times a day (BID) | ORAL | Status: DC
  Administered 2017-04-09 – 2017-04-13 (×8): 6.25 mg via ORAL
  Filled 2017-04-09: qty 1
  Filled 2017-04-09 (×2): qty 2
  Filled 2017-04-09 (×4): qty 1
  Filled 2017-04-09 (×2): qty 2
  Filled 2017-04-09: qty 1

## 2017-04-09 MED ORDER — HEPARIN SODIUM (PORCINE) 1000 UNIT/ML IJ SOLN
INTRAMUSCULAR | Status: DC | PRN
  Administered 2017-04-09: 2000 [IU] via INTRAVENOUS
  Administered 2017-04-09: 3000 [IU] via INTRAVENOUS
  Administered 2017-04-09: 4000 [IU] via INTRAVENOUS

## 2017-04-09 MED ORDER — IOPAMIDOL (ISOVUE-370) INJECTION 76%
INTRAVENOUS | Status: AC
Start: 2017-04-09 — End: ?
  Filled 2017-04-09: qty 125

## 2017-04-09 MED ORDER — HEPARIN BOLUS VIA INFUSION
2000.0000 [IU] | Freq: Once | INTRAVENOUS | Status: AC
  Administered 2017-04-09: 2000 [IU] via INTRAVENOUS
  Filled 2017-04-09: qty 2000

## 2017-04-09 MED ORDER — ATORVASTATIN CALCIUM 80 MG PO TABS
80.0000 mg | ORAL_TABLET | Freq: Every day | ORAL | Status: DC
  Administered 2017-04-09 – 2017-04-12 (×4): 80 mg via ORAL
  Filled 2017-04-09 (×4): qty 1

## 2017-04-09 MED ORDER — SODIUM CHLORIDE 0.9 % IV SOLN
INTRAVENOUS | Status: DC
  Administered 2017-04-09: 250 mL/h via INTRAVENOUS
  Administered 2017-04-09: 100 mL/h via INTRAVENOUS

## 2017-04-09 MED ORDER — VERAPAMIL HCL 2.5 MG/ML IV SOLN
INTRAVENOUS | Status: DC | PRN
  Administered 2017-04-09: 10 mL via INTRA_ARTERIAL

## 2017-04-09 MED ORDER — ALPRAZOLAM 0.25 MG PO TABS
0.2500 mg | ORAL_TABLET | Freq: Two times a day (BID) | ORAL | Status: DC | PRN
Start: 2017-04-09 — End: 2017-04-13

## 2017-04-09 MED ORDER — MIDAZOLAM HCL 2 MG/2ML IJ SOLN
INTRAMUSCULAR | Status: DC | PRN
  Administered 2017-04-09: 1 mg via INTRAVENOUS

## 2017-04-09 MED ORDER — IOPAMIDOL (ISOVUE-370) INJECTION 76%
INTRAVENOUS | Status: AC
  Filled 2017-04-09: qty 50

## 2017-04-09 MED ORDER — IOPAMIDOL (ISOVUE-370) INJECTION 76%
INTRAVENOUS | Status: DC | PRN
  Administered 2017-04-09: 125 mL via INTRA_ARTERIAL

## 2017-04-09 MED ORDER — FENTANYL CITRATE (PF) 100 MCG/2ML IJ SOLN
INTRAMUSCULAR | Status: DC | PRN
  Administered 2017-04-09: 25 ug via INTRAVENOUS

## 2017-04-09 MED ORDER — LIDOCAINE HCL (PF) 1 % IJ SOLN
INTRAMUSCULAR | Status: DC | PRN
  Administered 2017-04-09: 1 mL

## 2017-04-09 MED ORDER — SODIUM CHLORIDE 0.9% FLUSH
3.0000 mL | Freq: Two times a day (BID) | INTRAVENOUS | Status: DC
  Administered 2017-04-12: 3 mL via INTRAVENOUS

## 2017-04-09 MED ORDER — ZOLPIDEM TARTRATE 5 MG PO TABS
5.0000 mg | ORAL_TABLET | Freq: Every evening | ORAL | Status: DC | PRN
Start: 2017-04-09 — End: 2017-04-13

## 2017-04-09 SURGICAL SUPPLY — 17 items
BALLN SAPPHIRE 2.0X12 (BALLOONS) ×2
BALLN SAPPHIRE ~~LOC~~ 2.25X12 (BALLOONS) ×1 IMPLANT
BALLN SAPPHIRE ~~LOC~~ 2.5X8 (BALLOONS) ×1 IMPLANT
BALLOON SAPPHIRE 2.0X12 (BALLOONS) IMPLANT
CATH 5FR JL3.5 JR4 ANG PIG MP (CATHETERS) ×1 IMPLANT
CATH LAUNCHER 6FR EBU3.5 (CATHETERS) ×1 IMPLANT
DEVICE RAD COMP TR BAND LRG (VASCULAR PRODUCTS) ×1 IMPLANT
GLIDESHEATH SLEND SS 6F .021 (SHEATH) ×1 IMPLANT
GUIDEWIRE INQWIRE 1.5J.035X260 (WIRE) IMPLANT
INQWIRE 1.5J .035X260CM (WIRE) ×2
KIT ENCORE 26 ADVANTAGE (KITS) ×1 IMPLANT
KIT HEART LEFT (KITS) ×2 IMPLANT
PACK CARDIAC CATHETERIZATION (CUSTOM PROCEDURE TRAY) ×2 IMPLANT
STENT RESOLUTE ONYX 2.0X22 (Permanent Stent) ×1 IMPLANT
TRANSDUCER W/STOPCOCK (MISCELLANEOUS) ×2 IMPLANT
TUBING CIL FLEX 10 FLL-RA (TUBING) ×2 IMPLANT
WIRE RUNTHROUGH .014X180CM (WIRE) ×1 IMPLANT

## 2017-04-09 NOTE — Brief Op Note (Signed)
Brief Cardiac Catheterization Note  Date: 04/09/2017 Time: 5:22 PM  PATIENT:  [name].  72 y.o. male  PRE-OPERATIVE DIAGNOSIS:  NSTEMI  POST-OPERATIVE DIAGNOSIS:  NSTEMI  PROCEDURE:  Procedure(s): LEFT HEART CATH AND CORONARY ANGIOGRAPHY (N/A) CORONARY STENT INTERVENTION (N/A)  SURGEON:  Surgeon(s) and Role:    * Bhumi Godbey, MD - Primary  FINDINGS: 1. 2-vessel CAD with 80-90% mid LAD stenosis and occluded mid LCx. 2. Mild to moderate RCA disease. 3. Moderately reduced LVEF with mid anterolateral hypokinesis. Low normal LVEDP. 4. Successful PCI to mid LCx using Resolute Onyx 2.0 x 2.0 x 22 mm DES (post-dilated to 2.6 mm proximally) with 0% residual stenosis and TIMI-3 flow.  RECOMMENDATIONS: 1. Plan for staged PCI to LAD next week, as renal function allows. 2. ASA and ticagrelor given today; recommend switching ticagrelor to clopidogrel prior to discharge. Continue warfarin, clopidogrel, and aspirin x 1 month, then discontinue aspirin. 3. Aggressive secondary prevention.  Yvonne Kendall, MD [facility] Pager: [phone]

## 2017-04-09 NOTE — ED Notes (Signed)
Pt assisted by this RN to use restroom at bedside.

## 2017-04-09 NOTE — Progress Notes (Signed)
ANTICOAGULATION CONSULT NOTE - Follow Up Consult  Pharmacy Consult for Heparin Indication: chest pain/ACS  Allergies  Allergen Reactions  . Lyrica [Pregabalin]     "went out of head"    Patient Measurements: Height: 5\' 8"  (172.7 cm) Weight: 168 lb (76.2 kg) IBW/kg (Calculated) : 68.4  Vital Signs: Temp: 98.2 F (36.8 C) (01/18 1745) Temp Source: Oral (01/18 1745) BP: 124/64 (01/18 1745) Pulse Rate: 85 (01/18 1745)  Labs: Recent Labs    04/09/17 0932 04/09/17 1000 04/09/17 1023  HGB 15.8  --   --   HCT 46.5  --   --   PLT 203  --   --   LABPROT  --  20.1*  --   INR  --  1.73  --   CREATININE 1.08  --   --   TROPONINI  --   --  3.14*    Estimated Creatinine Clearance: 61.6 mL/min (by C-G formula based on SCr of 1.08 mg/dL).  Assessment: 71yom on warfarin pta for DVT/PE, transitioned to heparin for r/o ACS. He is now s/p cath found to have 2 vessel CAD. Received DES to LCx and planning for staged PCI to LAD on 1/21. Heparin to resume 8 hours post-sheath removal. Radial sheath removed at 1918 and TR to be deflated ~ 2130 per RN.  Goal of Therapy:  Heparin level 0.3-0.7 units/ml Monitor platelets by anticoagulation protocol: Yes   Plan:  1) At 0500 tomorrow, resume heparin at 900 units/hr 2) 8 hour heparin level 3) Daily heparin level and CBC  Fredrik Rigger 04/09/2017,5:57 PM

## 2017-04-09 NOTE — H&P (Signed)
Cardiology Admission History and Physical:   Patient ID: [name].; MRN: [id number]; DOB: 13-Sep-1945   Admission date: 04/09/2017  Primary Care Provider: Merlyn Albert, MD Primary Cardiologist:  new Primary Electrophysiologist:  n/a  Chief Complaint:  NSTEMI  Patient Profile:   [name]. is a 72 y.o. male with a history of bilat PE and DVT 10/2007 after immobility from back injury, associated w/ pain under L shoulder blade, (now on lifelong coumadin). FH CAD/CVA in his mother in her 74s. LDL has been elevated, not on rx.   History of Present Illness:   [name] had onset of SSCP 2 days ago after eating a banana. He thought it was indigestion, took Rolaids and Tylenol without relief. The pain radiated to up under his L shoulder blade. It reached a 7-8/10 at its worst. No associated SOB, or diaphoresis. He had nausea but no vomiting. Now, he feels the pain only with movement or deep inspiration. 5/10 pain when he got up to BR.   Some worsening w/ position change, sitting up. A little worse w/ deep inspiration or cough.   Never had this pain before, it is different from the PE pain.  No hx DOE, orthopnea or PND. Is active at work, builds cabinets. No recent travel, acute illness or immobility.  He wears compression hose on L, because the leg will swell if he doesn't.   No hx cath, remote stress test, > 10 years ago, was told it was ok.   He is compliant w/ coumadin, no doses missed, INR 2.7 last week.    Past Medical History:  Diagnosis Date  . Anticoagulated on Coumadin   . Avascular necrosis of hip (HCC)    LEFT  . DVT (deep venous thrombosis) (HCC) 2009  . Dyslipidemia, goal LDL below 70 04/09/2017  . Family history of anesthesia complication    BROTHER HAD MALIGNANT HYPOTHERMIA - PT HAS NOT HAD ANY PROBLEMS WITH ANESTHESIA    . History of kidney stones   . Malignant hyperthermia    PT'S BROTHER HAS HX MALIGNANT HYPERTHERMIA  . NSTEMI (non-ST  elevated myocardial infarction) (HCC) 04/09/2017  . Pulmonary embolism (HCC) 2009  . Vertigo    NO RECENT PROBLEMS    Past Surgical History:  Procedure Laterality Date  . BACK SURGERY  2009   DISCECTOMY  . CERVICAL FUSION  2008  . TOTAL HIP ARTHROPLASTY Right 12/22/2013   Procedure: RIGHT TOTAL HIP ARTHROPLASTY ANTERIOR APPROACH;  Surgeon: Shelda Pal, MD;  Location: [facility];  Service: Orthopedics;  Laterality: Right;  . TOTAL HIP ARTHROPLASTY Left 04/30/2015   Procedure: LEFT TOTAL HIP ARTHROPLASTY ANTERIOR APPROACH;  Surgeon: Durene Romans, MD;  Location: [facility];  Service: Orthopedics;  Laterality: Left;     Medications Prior to Admission: Prior to Admission medications   Medication Sig Start Date End Date Taking? Authorizing Provider  acetaminophen (TYLENOL) 500 MG tablet Take 2 tablets (1,000 mg total) by mouth every 8 (eight) hours. 05/01/15   Lanney Gins, PA-C  amoxicillin (AMOXIL) 500 MG capsule Take 4 capsules by mouth one hour prior to dental work 10/01/16   Babs Sciara, MD  methocarbamol (ROBAXIN) 750 MG tablet Take 1 tablet (750 mg total) by mouth every 6 (six) hours as needed for muscle spasms. 06/25/15   Merlyn Albert, MD  warfarin (COUMADIN) 5 MG tablet TAKE 1 TABLET DAILY 08/11/16   Merlyn Albert, MD     Allergies:    Allergies  Allergen Reactions  . Lyrica [Pregabalin]     "went out of head"    Social History:   Social History   Socioeconomic History  . Marital status: Married    Spouse name: Not on file  . Number of children: Not on file  . Years of education: Not on file  . Highest education level: Not on file  Social Needs  . Financial resource strain: Not on file  . Food insecurity - worry: Not on file  . Food insecurity - inability: Not on file  . Transportation needs - medical: Not on file  . Transportation needs - non-medical: Not on file  Occupational History  . Occupation: self employed, Fish farm manager  Tobacco Use  . Smoking  status: Never Smoker  . Smokeless tobacco: Never Used  Substance and Sexual Activity  . Alcohol use: No  . Drug use: No  . Sexual activity: Not on file  Other Topics Concern  . Not on file  Social History Narrative  . Not on file    Family History:   The patient's family history includes Breast cancer in his mother; Diabetes in his paternal grandmother; Heart attack (age of onset: 55) in his mother; Hyperlipidemia in his mother; Hypertension in his mother and paternal grandmother.   Family Status  Relation Name Status  . Mother  Deceased  . Father  Deceased  . MGM  Deceased  . MGF  Deceased  . PGM  Deceased  . PGF  Deceased   ROS:  Please see the history of present illness.  All other ROS reviewed and negative.     Physical Exam/Data:   Vitals:   04/09/17 1145 04/09/17 1215 04/09/17 1233 04/09/17 1245  BP: 133/87 135/85  102/84  Pulse: 88 89  97  Resp: 17 20  (!) 21  Temp:      TempSrc:      SpO2: 97% 96%  96%  Weight:   168 lb (76.2 kg)   Height:   5\' 8"  (1.727 m)     Intake/Output Summary (Last 24 hours) at 04/09/2017 1249 Last data filed at 04/09/2017 1142 Gross per 24 hour  Intake 60 ml  Output -  Net 60 ml   Filed Weights   04/09/17 1233  Weight: 168 lb (76.2 kg)   Body mass index is 25.54 kg/m.  General:  Well nourished, well developed, in no acute distress HEENT: normal Lymph: no adenopathy Neck: no JVD Endocrine:  No thryomegaly Vascular: No carotid bruits; FA pulses 2+ bilaterally without bruits  Cardiac:  normal S1, S2; RRR; no murmur  Lungs:  clear to auscultation bilaterally, no wheezing, rhonchi or rales; no chest wall tenderness Abd: soft, nontender, no hepatomegaly  Ext: no edema, compression sock in place on L Musculoskeletal:  No deformities, BUE and BLE strength normal and equal Skin: warm and dry  Neuro:  CNs 2-12 intact, no focal abnormalities noted Psych:  Normal affect    EKG:  The ECG that was done 01/19 was personally reviewed  and demonstrates SR, No Q waves, no ST elevation and depression.  Relevant CV Studies: none  Laboratory Data:  Chemistry Recent Labs  Lab 04/09/17 0932  NA 137  K 3.5  CL 99*  CO2 25  GLUCOSE 112*  BUN 12  CREATININE 1.08  CALCIUM 9.6  GFRNONAA >60  GFRAA >60  ANIONGAP 13    No results for input(s): PROT, ALBUMIN, AST, ALT, ALKPHOS, BILITOT in the last 168 hours. Hematology  Recent Labs  Lab 04/09/17 0932  WBC 10.6*  RBC 5.28  HGB 15.8  HCT 46.5  MCV 88.1  MCH 29.9  MCHC 34.0  RDW 12.8  PLT 203   Cardiac Enzymes Recent Labs  Lab 04/09/17 1023  TROPONINI 3.14*    Recent Labs  Lab 04/09/17 0950  TROPIPOC 2.08*     Radiology/Studies:  Dg Chest 2 View  Result Date: 04/09/2017 CLINICAL DATA:  Left-sided chest pain for the past 2 days. EXAM: CHEST  2 VIEW COMPARISON:  Chest x-ray dated 12/15/2013. FINDINGS: The heart size and mediastinal contours are within normal limits. Normal pulmonary vascularity. No focal consolidation, pleural effusion, or pneumothorax. No acute osseous abnormality. Unchanged mild elevation of the right hemidiaphragm. IMPRESSION: No active cardiopulmonary disease. Electronically Signed   By: Obie Dredge M.D.   On: 04/09/2017 10:12    Assessment and Plan:   Principal Problem: 1.  NSTEMI (non-ST elevated myocardial infarction) (HCC) - admit, continue to cycle ez - pt on cath board for this pm - echo ordered - pt given ASA and heparin in ER  - add high-dose statin, BB   Active Problems: 2.  Long term (current) use of anticoagulants - chronic coumadin since DVT/PE 2009 - pt has never been asked about switching to DOAC  3.  Dyslipidemia, goal LDL below 70 - PCP has told him his bad cholesterol was high, but not high enough for rx   Severity of Illness: The appropriate patient status for this patient is INPATIENT. Inpatient status is judged to be reasonable and necessary in order to provide the required intensity of service to  ensure the patient's safety. The patient's presenting symptoms, physical exam findings, and initial radiographic and laboratory data in the context of their chronic comorbidities is felt to place them at high risk for further clinical deterioration. Furthermore, it is not anticipated that the patient will be medically stable for discharge from the hospital within 2 midnights of admission. The following factors support the patient status of inpatient.   " The patient's presenting symptoms include chest pain. " The worrisome physical exam findings include elevated troponin. " The initial radiographic and laboratory data are worrisome because of elevated troponin. " The chronic co-morbidities include age, gender, FH CAD, LDL.   * I certify that at the point of admission it is my clinical judgment that the patient will require inpatient hospital care spanning beyond 2 midnights from the point of admission due to high intensity of service, high risk for further deterioration and high frequency of surveillance required.*    For questions or updates, please contact [facility] Please consult [email or web address] for contact info under Cardiology/STEMI.    Signed, Theodore Demark, PA-C  04/09/2017 12:49 PM   Personally seen and examined. Agree with above.  72 year old male with chronic Coumadin use currently INR 1.7 secondary to history of bilateral PEs and DVTs back in 2009 after back surgery immobility with continued use here with non-ST elevation myocardial infarction.  He has been feeling symptoms of angina, exertional chest tightness with left arm radiation relieved with rest.  Lab work demonstrates troponin elevation of 3.1.  EKG with nonspecific ST-T wave changes.  Exam: Regular rate and rhythm, no murmurs rubs or gallops.  Alert.  Excellent radial pulse.  Lungs are clear, abdomen is soft nontender.  Assessment and plan:  Non-ST elevation myocardial infarction -Cardiac catheterization.  Risks  and benefits explained including stroke heart attack bleeding death.  Willing to proceed.  Continue with  aggressive secondary prevention.  Aspirin, statin, beta-blocker, ACE inhibitor if necessary.  Note, his INR currently is 1.7.  He is on chronic Coumadin because of history of DVT PE in 2009.  Chronic intake regulation with history of DVT PE -As above  Hyperlipidemia -Check lipid panel, statin  Donato Schultz, MD

## 2017-04-09 NOTE — ED Notes (Signed)
Patient and family educated about bleeding risks associated to Heparin, encouraged to be use electric shaver, soft toothbrush

## 2017-04-09 NOTE — ED Notes (Signed)
Per lab PT's troponin is 3.14 ED providers Notified

## 2017-04-09 NOTE — Interval H&P Note (Signed)
History and Physical Interval Note:  04/09/2017 3:51 PM  [name].  has presented today for cardiac catheterization, with the diagnosis of nstemi. The various methods of treatment have been discussed with the patient and family. After consideration of risks, benefits and other options for treatment, the patient has consented to  Procedure(s): LEFT HEART CATH AND CORONARY ANGIOGRAPHY (N/A) as a surgical intervention .  The patient's history has been reviewed, patient examined, no change in status, stable for surgery.  I have reviewed the patient's chart and labs.  Questions were answered to the patient's satisfaction.    Cath Lab Visit (complete for each Cath Lab visit)  Clinical Evaluation Leading to the Procedure:   ACS: Yes.    Non-ACS:  N/A   [name]

## 2017-04-09 NOTE — ED Notes (Signed)
Patient denies chest pain and is resting comfortably.

## 2017-04-09 NOTE — Telephone Encounter (Signed)
Patient calls with c/o chest pain-at first thought it was indigestion but it is not going away. Consult with Dr Brett Canales who advised patient to go to the ER for evaluation and treatment. Patient verbalized understanding.

## 2017-04-09 NOTE — ED Triage Notes (Signed)
Pt reports left sided chest pain x2 days, called his pcp who recommended he come to the ED. Pt reports some nausea last night.

## 2017-04-09 NOTE — Progress Notes (Signed)
ANTICOAGULATION CONSULT NOTE - Initial Consult  Pharmacy Consult for heparin Indication: chest pain/ACS  Allergies  Allergen Reactions  . Lyrica [Pregabalin]     "went out of head"    Patient Measurements: Height: 5\' 8"  (172.7 cm) Weight: 168 lb (76.2 kg) IBW/kg (Calculated) : 68.4  Vital Signs: Temp: 98.3 F (36.8 C) (01/18 0925) Temp Source: Oral (01/18 0925) BP: 102/84 (01/18 1245) Pulse Rate: 97 (01/18 1245)  Labs: Recent Labs    04/09/17 0932 04/09/17 1000 04/09/17 1023  HGB 15.8  --   --   HCT 46.5  --   --   PLT 203  --   --   LABPROT  --  20.1*  --   INR  --  1.73  --   CREATININE 1.08  --   --   TROPONINI  --   --  3.14*    Estimated Creatinine Clearance: 61.6 mL/min (by C-G formula based on SCr of 1.08 mg/dL).  Assessment: CC/HPI: 72 yo m presenting with CP  PMH: DVT/PE on warfarin  Anticoag: warfarin pta for hx dvt/pe now to iv hep for r/o ACS.  Admit INR 1.73  PTA warfarin 2.5 mg Sat and 5 mg AOD  CV: trop 3.14  Renal: SCr 1.08  Heme/Onc: H&H 15.8/46.5, Plt 203  Goal of Therapy:  Heparin level 0.3-0.7 units/ml Monitor platelets by anticoagulation protocol: Yes   Plan:  Add hep gtt, will give half bolus since on warfarin but INR low (2000 units x 1) Hep gtt 900 units/hr Initial lvl 2100 Daily HL CBC  Isaac Bliss, PharmD, BCPS, BCCCP Clinical Pharmacist Clinical phone for 04/09/2017 from 7a-3:30p: [phone] If after 3:30p, please call main pharmacy at: [phone] 04/09/2017 12:53 PM

## 2017-04-09 NOTE — ED Provider Notes (Signed)
[facility] EMERGENCY DEPARTMENT Provider Note   CSN: [id number] Arrival date & time: 04/09/17  4098     History   Chief Complaint Chief Complaint  Patient presents with  . Chest Pain   HPI   Blood pressure (!) 156/102, pulse 99, temperature 98.3 F (36.8 C), temperature source Oral, resp. rate 15, SpO2 97 %.  [name]. is a 72 y.o. male past medical history significant for DVT/PE, anticoagulated with Coumadin complaining of anterior chest pain pressure like, waxing and wheezing, eased off by sitting very still in her chair onset 2 days ago, pain was severe, kept him from sleep last night it is not associated with diaphoresis, shortness of breath, palpitations.  Feels it is similar to prior PE.  States the pain is not as severe or sharp in character and he also states that he has no leg swelling like he did with the PE.  He cannot tell me if it is exacerbated by exertion because he has not exerted himself.  He says it is not positional, not pleuritic.  There is no associated cough, shortness of breath.  Past Medical History:  Diagnosis Date  . Anticoagulated on Coumadin   . Avascular necrosis of hip (HCC)    LEFT  . DVT (deep venous thrombosis) (HCC) 2009  . Family history of anesthesia complication    BROTHER HAD MALIGNANT HYPOTHERMIA - PT HAS NOT HAD ANY PROBLEMS WITH ANESTHESIA    . History of kidney stones   . Malignant hyperthermia    PT'S BROTHER HAS HX MALIGNANT HYPERTHERMIA  . Pulmonary embolism (HCC) 2009  . Vertigo    NO RECENT PROBLEMS    Patient Active Problem List   Diagnosis Date Noted  . Overweight (BMI 25.0-29.9) 05/01/2015  . S/P left THA, AA 04/30/2015  . Status post total replacement of right hip 12/22/2013  . Elevated PSA 01/18/2013  . Erectile dysfunction 01/03/2013  . Long term (current) use of anticoagulants 06/25/2012    Past Surgical History:  Procedure Laterality Date  . BACK SURGERY  2009   DISCECTOMY  .  CERVICAL FUSION  2008  . TOTAL HIP ARTHROPLASTY Right 12/22/2013   Procedure: RIGHT TOTAL HIP ARTHROPLASTY ANTERIOR APPROACH;  Surgeon: Shelda Pal, MD;  Location: [facility];  Service: Orthopedics;  Laterality: Right;  . TOTAL HIP ARTHROPLASTY Left 04/30/2015   Procedure: LEFT TOTAL HIP ARTHROPLASTY ANTERIOR APPROACH;  Surgeon: Durene Romans, MD;  Location: [facility];  Service: Orthopedics;  Laterality: Left;       Home Medications    Prior to Admission medications   Medication Sig Start Date End Date Taking? Authorizing Provider  acetaminophen (TYLENOL) 500 MG tablet Take 2 tablets (1,000 mg total) by mouth every 8 (eight) hours. 05/01/15   Lanney Gins, PA-C  amoxicillin (AMOXIL) 500 MG capsule Take 4 capsules by mouth one hour prior to dental work 10/01/16   Babs Sciara, MD  methocarbamol (ROBAXIN) 750 MG tablet Take 1 tablet (750 mg total) by mouth every 6 (six) hours as needed for muscle spasms. 06/25/15   Merlyn Albert, MD  warfarin (COUMADIN) 5 MG tablet TAKE 1 TABLET DAILY 08/11/16   Merlyn Albert, MD    Family History Family History  Problem Relation Age of Onset  . Hypertension Other   . Diabetes Other   . Breast cancer Mother   . Hypertension Mother   . Hyperlipidemia Mother   . Heart attack Mother     Social  History Social History   Tobacco Use  . Smoking status: Never Smoker  . Smokeless tobacco: Never Used  Substance Use Topics  . Alcohol use: No  . Drug use: No     Allergies   Lyrica [pregabalin]   Review of Systems Review of Systems  A complete review of systems was obtained and all systems are negative except as noted in the HPI and PMH.   Physical Exam Updated Vital Signs BP 133/87   Pulse 88   Temp 98.3 F (36.8 C) (Oral)   Resp 17   SpO2 97%   Physical Exam  Constitutional: He is oriented to person, place, and time. He appears well-developed and well-nourished. No distress.  HENT:  Head: Normocephalic and atraumatic.    Mouth/Throat: Oropharynx is clear and moist.  Eyes: Conjunctivae and EOM are normal. Pupils are equal, round, and reactive to light.  Neck: Normal range of motion. No JVD present. No tracheal deviation present.  Cardiovascular: Normal rate, regular rhythm and intact distal pulses.  Radial pulse equal bilaterally  Pulmonary/Chest: Effort normal and breath sounds normal. No stridor. No respiratory distress. He has no wheezes. He has no rales. He exhibits no tenderness.  Abdominal: Soft. He exhibits no distension and no mass. There is no tenderness. There is no rebound and no guarding.  Musculoskeletal: Normal range of motion. He exhibits no edema or tenderness.  No calf asymmetry, superficial collaterals, palpable cords, edema, Homans sign negative bilaterally.    Neurological: He is alert and oriented to person, place, and time.  Skin: Skin is warm. He is not diaphoretic.  Psychiatric: He has a normal mood and affect.  Nursing note and vitals reviewed.    ED Treatments / Results  Labs (all labs ordered are listed, but only abnormal results are displayed) Labs Reviewed  BASIC METABOLIC PANEL - Abnormal; Notable for the following components:      Result Value   Chloride 99 (*)    Glucose, Bld 112 (*)    All other components within normal limits  CBC - Abnormal; Notable for the following components:   WBC 10.6 (*)    All other components within normal limits  PROTIME-INR - Abnormal; Notable for the following components:   Prothrombin Time 20.1 (*)    All other components within normal limits  TROPONIN I - Abnormal; Notable for the following components:   Troponin I 3.14 (*)    All other components within normal limits  I-STAT TROPONIN, ED - Abnormal; Notable for the following components:   Troponin i, poc 2.08 (*)    All other components within normal limits    EKG  EKG Interpretation None       Radiology Dg Chest 2 View  Result Date: 04/09/2017 CLINICAL DATA:   Left-sided chest pain for the past 2 days. EXAM: CHEST  2 VIEW COMPARISON:  Chest x-ray dated 12/15/2013. FINDINGS: The heart size and mediastinal contours are within normal limits. Normal pulmonary vascularity. No focal consolidation, pleural effusion, or pneumothorax. No acute osseous abnormality. Unchanged mild elevation of the right hemidiaphragm. IMPRESSION: No active cardiopulmonary disease. Electronically Signed   By: Obie Dredge M.D.   On: 04/09/2017 10:12    Procedures Procedures (including critical care time)  CRITICAL CARE Performed by: Joni Reining Scarleth Brame   Total critical care time: 45 minutes  Critical care time was exclusive of separately billable procedures and treating other patients.  Critical care was necessary to treat or prevent imminent or life-threatening deterioration.  Critical care  was time spent personally by me on the following activities: development of treatment plan with patient and/or surrogate as well as nursing, discussions with consultants, evaluation of patient's response to treatment, examination of patient, obtaining history from patient or surrogate, ordering and performing treatments and interventions, ordering and review of laboratory studies, ordering and review of radiographic studies, pulse oximetry and re-evaluation of patient's condition.   Medications Ordered in ED Medications  0.9 %  sodium chloride infusion (100 mL/hr Intravenous New Bag/Given 04/09/17 1038)  nitroGLYCERIN (NITROGLYN) 2 % ointment 1 inch (1 inch Topical Given 04/09/17 1037)  aspirin chewable tablet 324 mg (324 mg Oral Given 04/09/17 1138)     Initial Impression / Assessment and Plan / ED Course  I have reviewed the triage vital signs and the nursing notes.  Pertinent labs & imaging results that were available during my care of the patient were reviewed by me and considered in my medical decision making (see chart for details).     Vitals:   04/09/17 1100 04/09/17 1115  04/09/17 1130 04/09/17 1145  BP: (!) 143/84 (!) 147/96 (!) 144/98 133/87  Pulse: 84 92 90 88  Resp: 16 18 20 17   Temp:      TempSrc:      SpO2: 97% 98% 98% 97%    Medications  0.9 %  sodium chloride infusion (100 mL/hr Intravenous New Bag/Given 04/09/17 1038)  nitroGLYCERIN (NITROGLYN) 2 % ointment 1 inch (1 inch Topical Given 04/09/17 1037)  aspirin chewable tablet 324 mg (324 mg Oral Given 04/09/17 1138)    Merwyn Hodapp. is 72 y.o. male presenting with pressure-like anterior chest pain radiating to the back onset 2 days ago, waxing and waning he states is alleviated by sitting in a chair and being very very still.  He has a history of DVT/PE but he states it feels just similar to prior PE, no tachypnea or tachycardia, patient states that his last INR check was 2.7 last week.  This appears to be more cardiac in nature.  Aside from being elderly and overweight he does not have many cardiac risk factors.  His EKG with some anterior mitten isolated ST depressions in abnormality in V5, troponin is elevated at 2.0, given that he is anticoagulated will hold off on heparin until discussing with cardiology  INR found to be subtherapeutic at 1.73, cardiology aware.  Lab troponin is elevated at 3.14.  Discussed with Bjorn Loser who states that the patient will likely be taken to the Cath Lab.  Patient remains comfortable at this time.    Final Clinical Impressions(s) / ED Diagnoses   Final diagnoses:  NSTEMI (non-ST elevated myocardial infarction) [facility])    ED Discharge Orders    None       Lynetta Mare Mardella Layman 04/09/17 1217    Gerhard Munch, MD 04/09/17 1421

## 2017-04-10 LAB — COMPREHENSIVE METABOLIC PANEL
ALBUMIN: 3.3 g/dL — AB (ref 3.5–5.0)
ALT: 29 U/L (ref 17–63)
ANION GAP: 10 (ref 5–15)
AST: 45 U/L — AB (ref 15–41)
Alkaline Phosphatase: 47 U/L (ref 38–126)
BILIRUBIN TOTAL: 1.5 mg/dL — AB (ref 0.3–1.2)
BUN: 15 mg/dL (ref 6–20)
CHLORIDE: 103 mmol/L (ref 101–111)
CO2: 21 mmol/L — ABNORMAL LOW (ref 22–32)
Calcium: 8 mg/dL — ABNORMAL LOW (ref 8.9–10.3)
Creatinine, Ser: 1.01 mg/dL (ref 0.61–1.24)
GFR calc Af Amer: >60 mL/min (ref >60)
GLUCOSE: 93 mg/dL (ref 65–99)
POTASSIUM: 3.5 mmol/L (ref 3.5–5.1)
Sodium: 134 mmol/L — ABNORMAL LOW (ref 135–145)
TOTAL PROTEIN: 6.1 g/dL — AB (ref 6.5–8.1)

## 2017-04-10 LAB — CBC
HCT: 38 % — ABNORMAL LOW (ref 39.0–52.0)
HEMOGLOBIN: 12.7 g/dL — AB (ref 13.0–17.0)
MCH: 29.5 pg (ref 26.0–34.0)
MCHC: 33.4 g/dL (ref 30.0–36.0)
MCV: 88.2 fL (ref 78.0–100.0)
Platelets: 177 10*3/uL (ref 150–400)
RBC: 4.31 MIL/uL (ref 4.22–5.81)
RDW: 12.9 % (ref 11.5–15.5)
WBC: 8.8 10*3/uL (ref 4.0–10.5)

## 2017-04-10 LAB — HEPARIN LEVEL (UNFRACTIONATED)
HEPARIN UNFRACTIONATED: 0.1 [IU]/mL — AB (ref 0.30–0.70)
HEPARIN UNFRACTIONATED: 0.22 [IU]/mL — AB (ref 0.30–0.70)

## 2017-04-10 LAB — LIPID PANEL
CHOL/HDL RATIO: 3.6 ratio
CHOLESTEROL: 112 mg/dL (ref 0–200)
HDL: 31 mg/dL — AB (ref >40)
LDL Cholesterol: 64 mg/dL (ref 0–99)
Triglycerides: 87 mg/dL (ref <150)
VLDL: 17 mg/dL (ref 0–40)

## 2017-04-10 LAB — TROPONIN I: Troponin I: 8.83 ng/mL (ref <0.03)

## 2017-04-10 MED ORDER — ANGIOPLASTY BOOK
Freq: Once | Status: AC
  Administered 2017-04-10: 06:00:00
  Filled 2017-04-10: qty 1

## 2017-04-10 MED ORDER — HEPARIN BOLUS VIA INFUSION
2000.0000 [IU] | Freq: Once | INTRAVENOUS | Status: AC
Start: 2017-04-10 — End: 2017-04-10
  Administered 2017-04-10: 2000 [IU] via INTRAVENOUS
  Filled 2017-04-10: qty 2000

## 2017-04-10 NOTE — Progress Notes (Signed)
CARDIAC REHAB PHASE I   PRE:  Rate/Rhythm: 75 nsr  BP:  Sitting: 108/66      SaO2: 93 ra  MODE:  Ambulation: 500 ft   POST:  Rate/Rhythm: 76 nsr  BP:  Sitting: 122/65     SaO2: 96 ra 0930-1030 Patient ambulated in hallway independently pushing IV pole. Wife and CR RN by side. Steady gait noted. Patient denied complaints. Post ambulation patient back to bed. Began discharge education. Patient and wife had lots of appropriate questions. MI booklet, diet sheet, low NA diet reviewed in detail along with CR phase 2. Will wait and place order once patient decide what CR program he wants to attend ([facility] vs [facility]). Brochure given for [facility] phase 2 and pt wife will check with insurance for coverage. 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 Worried about financial impact of MI and ability to continue to work over next few weeks. Will finish d/c education once patient has returned to cath lab on Monday for additional intervention.  Ely Ballen English PayneRN, BSN 04/10/2017 10:36 AM

## 2017-04-10 NOTE — Progress Notes (Signed)
ANTICOAGULATION CONSULT NOTE - Follow Up Consult  Pharmacy Consult for Heparin Indication: chest pain/ACS  Allergies  Allergen Reactions  . Lyrica [Pregabalin]     "went out of head"    Patient Measurements: Height: 5\' 8"  (172.7 cm) Weight: 173 lb 8 oz (78.7 kg) IBW/kg (Calculated) : 68.4  Vital Signs: Temp: 98.2 F (36.8 C) (01/19 1100) Temp Source: Oral (01/19 1100) BP: 116/59 (01/19 1210) Pulse Rate: 71 (01/19 1100)  Labs: Recent Labs    04/09/17 0932 04/09/17 1000  04/09/17 1825 04/09/17 2256 04/10/17 0539 04/10/17 1259  HGB 15.8  --   --   --   --  12.7*  --   HCT 46.5  --   --   --   --  38.0*  --   PLT 203  --   --   --   --  177  --   LABPROT  --  20.1*  --   --   --   --   --   INR  --  1.73  --   --   --   --   --   HEPARINUNFRC  --   --   --   --   --   --  0.10*  CREATININE 1.08  --   --   --   --  1.01  --   TROPONINI  --   --    < > 31.37* 15.65* 8.83*  --    < > = values in this interval not displayed.    Estimated Creatinine Clearance: 65.8 mL/min (by C-G formula based on SCr of 1.01 mg/dL).  Assessment: 71yom on warfarin pta for DVT/PE, transitioned to heparin for r/o ACS. He is now s/p cath found to have 2 vessel CAD. Received DES to LCx and planning for staged PCI to LAD on 1/21. Heparin to resume 8 hours post-sheath removal. Radial sheath removed at 1918 and TR to be deflated ~ 2130 per RN.  Heparin level this afternoon below goal with reported value of < 0.10. Hemoglobin trending down to 12.7 and platelets down slightly to 177. Spoke with the nurse and the infusion has been running all day with no concern. No issues with bleeding either.   Goal of Therapy:  Heparin level 0.3-0.7 units/ml Monitor platelets by anticoagulation protocol: Yes   Plan:  1) heparin bolus 2000 units x 1  2) increase heparin infusion to 1150 units  3) 8 hour heparin level 4) Daily heparin level and CBC  Blake Divine, Pharm.D. PGY1 Pharmacy Resident 04/10/2017  2:44 PM Main Pharmacy: [phone]

## 2017-04-10 NOTE — Progress Notes (Signed)
Progress Note  Patient Name: [name]. Date of Encounter: 04/10/2017  Primary Cardiologist: New to Dr. Anne Fu  Subjective   Feeling well. No chest pain, sob or palpitations.   Inpatient Medications    Scheduled Meds: . aspirin  81 mg Oral Daily  . atorvastatin  80 mg Oral q1800  . carvedilol  6.25 mg Oral BID WC  . latanoprost  1 drop Both Eyes QHS  . sodium chloride flush  3 mL Intravenous Q12H  . sodium chloride flush  3 mL Intravenous Q12H  . ticagrelor  90 mg Oral BID   Continuous Infusions: . sodium chloride    . sodium chloride    . heparin 900 Units/hr (04/10/17 0535)   PRN Meds: sodium chloride, sodium chloride, acetaminophen, ALPRAZolam, nitroGLYCERIN, ondansetron (ZOFRAN) IV, sodium chloride flush, sodium chloride flush, zolpidem   Vital Signs    Vitals:   04/09/17 2004 04/09/17 2100 04/10/17 0321 04/10/17 0750  BP: 133/66 132/69 110/66 (!) 108/57  Pulse: 91  72 74  Resp: (!) 26 (!) 24 (!) 24 (!) 21  Temp: 98.8 F (37.1 C)  98.7 F (37.1 C) 98 F (36.7 C)  TempSrc: Oral  Oral Oral  SpO2: 94%   94%  Weight:   173 lb 8 oz (78.7 kg)   Height:        Intake/Output Summary (Last 24 hours) at 04/10/2017 0905 Last data filed at 04/10/2017 0752 Gross per 24 hour  Intake 598.75 ml  Output 400 ml  Net 198.75 ml   Filed Weights   04/09/17 1233 04/10/17 0321  Weight: 168 lb (76.2 kg) 173 lb 8 oz (78.7 kg)    Telemetry    SR  - Personally Reviewed  ECG    NSR - Personally Reviewed  Physical Exam   GEN: No acute distress.   Neck: No JVD Cardiac: RRR, no murmurs, rubs, or gallops. R radial cath site stable.  Respiratory: Clear to auscultation bilaterally. GI: Soft, nontender, non-distended  MS: No edema; No deformity. Neuro:  Nonfocal  Psych: Normal affect   Labs    Chemistry Recent Labs  Lab 04/09/17 0932 04/10/17 0539  NA 137 134*  K 3.5 3.5  CL 99* 103  CO2 25 21*  GLUCOSE 112* 93  BUN 12 15  CREATININE 1.08 1.01    CALCIUM 9.6 8.0*  PROT  --  6.1*  ALBUMIN  --  3.3*  AST  --  45*  ALT  --  29  ALKPHOS  --  47  BILITOT  --  1.5*  GFRNONAA >60 >60  GFRAA >60 >60  ANIONGAP 13 10     Hematology Recent Labs  Lab 04/09/17 0932 04/10/17 0539  WBC 10.6* 8.8  RBC 5.28 4.31  HGB 15.8 12.7*  HCT 46.5 38.0*  MCV 88.1 88.2  MCH 29.9 29.5  MCHC 34.0 33.4  RDW 12.8 12.9  PLT 203 177    Cardiac Enzymes Recent Labs  Lab 04/09/17 1023 04/09/17 1825 04/09/17 2256 04/10/17 0539  TROPONINI 3.14* 31.37* 15.65* 8.83*    Recent Labs  Lab 04/09/17 0950  TROPIPOC 2.08*    Radiology    Dg Chest 2 View  Result Date: 04/09/2017 CLINICAL DATA:  Left-sided chest pain for the past 2 days. EXAM: CHEST  2 VIEW COMPARISON:  Chest x-ray dated 12/15/2013. FINDINGS: The heart size and mediastinal contours are within normal limits. Normal pulmonary vascularity. No focal consolidation, pleural effusion, or pneumothorax. No acute osseous abnormality. Unchanged  mild elevation of the right hemidiaphragm. IMPRESSION: No active cardiopulmonary disease. Electronically Signed   By: Obie Dredge M.D.   On: 04/09/2017 10:12    Cardiac Studies   CORONARY STENT INTERVENTION  LEFT HEART CATH AND CORONARY ANGIOGRAPHY  Conclusion                 Conclusions: 1. Significant two-vessel coronary artery disease with 80-90% mid LAD stenosis and occluded mid LCx. 2. Mild to moderate right coronary artery disease. Proximal and mid portions of the LAD and RCA are ectatic/aneurysmal. 3. Mildly to moderately reduced left ventricular contraction with mid anterolateral hypokinesis. Low normal LVEDP. 4. Successful PCI to mid LCx using Resolute Onyx 2.0 x 2.0 x 22 mm DES (post-dilated to 2.6 mm proximally) with 0% residual stenosis and TIMI-3 flow.  Recommendations: 1. Plan for staged PCI to LAD next week, as renal function allows. 2. ASA and ticagrelor given today; recommend switching ticagrelor to clopidogrel prior to  discharge. Continue warfarin, clopidogrel, and aspirin x 1 month, then discontinue aspirin and complete at least 12 months of warfarin and clopidogrel. 3. Aggressive secondary prevention     Patient Profile     72 y.o. 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 Ruled in for NSTEMI.   Assessment & Plan    1. NSTEMI - Peak of troponin 31.37. Treated with IV heparin. Cath showed 2-vessel CAD with 80-90% mid LAD stenosis and occluded mid LCx. Mild to moderate RCA disease. Moderately reduced LVEF with mid anterolateral hypokinesis. Low normal LVEDP/ S/p Successful PCI to mid LCx using Resolute Onyx. Plan for staged PCI of LAD Monday.  - started on ASA and Brillinta. Plan to change Brillinta to plavix prior to discharge.  - Plan to treat with triple therapy (ASA. Plavix and Warfarin) and then stop ASA after 1 month.  - Continue BB and statin.   2. HLD - 04/10/2017: Cholesterol 112; HDL 31; LDL Cholesterol 64; Triglycerides 87; VLDL 17  - LDL at goal (below 70). He will benefits from tight control. Lipitor started this admission.   3.  Long term (current) use of anticoagulants - chronic coumadin since DVT/PE 2009 - pt has never been asked about switching to DOAC  4. LV dysfunction - By cath. Pending echo. Euvolemic. Continue BB. Add ACE/ARB pending echo.    For questions or updates, please contact [facility] Please consult [email or web address] for contact info under Cardiology/STEMI.      Lorelei Pont, PA  04/10/2017, 9:05 AM

## 2017-04-10 NOTE — Progress Notes (Signed)
NURSING PROGRESS NOTE  Imri Delgardo [id number] Transfer Data: 04/10/2017 2:32 PM Attending Provider: Jake Bathe, MD DSK:[id number], Vilinda Blanks, MD Code Status: full  Clydie Delamora. is a 72 y.o. male patient transferred from [facility]  -No acute distress noted.  -No complaints of shortness of breath.  -No complaints of chest pain.   Cardiac Monitoring: Box # 19 in place. Cardiac monitor yields:SR.  Blood pressure (!) 116/59, pulse 71, temperature 98.2 F (36.8 C), temperature source Oral, resp. rate (!) 23, height 5\' 8"  (1.727 m), weight 78.7 kg (173 lb 8 oz), SpO2 95 %.   IV Fluids:  IV in place, occlusive dsg intact without redness, IV cath Heparin gtt at 9cc/hr.   Allergies:  Lyrica [pregabalin]  Past Medical History:   has a past medical history of Anticoagulated on Coumadin, Avascular necrosis of hip (HCC), DVT (deep venous thrombosis) (HCC) (2009), Dyslipidemia, goal LDL below 70 (04/09/2017), Family history of anesthesia complication, History of kidney stones, Malignant hyperthermia, NSTEMI (non-ST elevated myocardial infarction) (HCC) (04/09/2017), Pulmonary embolism (HCC) (2009), and Vertigo.  Past Surgical History:   has a past surgical history that includes Cervical fusion (2008); Back surgery (2009); Total hip arthroplasty (Right, 12/22/2013); and Total hip arthroplasty (Left, 04/30/2015).  Social History:   reports that  has never smoked. he has never used smokeless tobacco. He reports that he does not drink alcohol or use drugs.  Skin: Intact  Patient/Family orientated to room. Information packet given to patient/family. Admission inpatient armband information verified with patient/family to include name and date of birth and placed on patient arm. Side rails up x 2, fall assessment and education completed with patient/family. Patient/family able to verbalize understanding of risk associated with falls and verbalized understanding to call for assistance before getting out  of bed. Call light within reach. Patient/family able to voice and demonstrate understanding of unit orientation instructions.    Will continue to evaluate and treat per MD orders.

## 2017-04-10 NOTE — Progress Notes (Signed)
ANTICOAGULATION CONSULT NOTE Pharmacy Consult for Heparin Indication: h/o DVT/PE  Allergies  Allergen Reactions  . Lyrica [Pregabalin]     "went out of head"    Patient Measurements: Height: 5\' 8"  (172.7 cm) Weight: 173 lb 8 oz (78.7 kg) IBW/kg (Calculated) : 68.4  Vital Signs: Temp: 100.3 F (37.9 C) (01/19 2121) Temp Source: Oral (01/19 2121) BP: 108/66 (01/19 2121) Pulse Rate: 78 (01/19 2121)  Labs: Recent Labs    04/09/17 0932 04/09/17 1000  04/09/17 1825 04/09/17 2256 04/10/17 0539 04/10/17 1259 04/10/17 2224  HGB 15.8  --   --   --   --  12.7*  --   --   HCT 46.5  --   --   --   --  38.0*  --   --   PLT 203  --   --   --   --  177  --   --   LABPROT  --  20.1*  --   --   --   --   --   --   INR  --  1.73  --   --   --   --   --   --   HEPARINUNFRC  --   --   --   --   --   --  0.10* 0.22*  CREATININE 1.08  --   --   --   --  1.01  --   --   TROPONINI  --   --    < > 31.37* 15.65* 8.83*  --   --    < > = values in this interval not displayed.    Estimated Creatinine Clearance: 65.8 mL/min (by C-G formula based on SCr of 1.01 mg/dL).  Assessment: 72 y.o. male with h/o DVT/PE, Coumadin on hold, now s/p PCI and awaiting staged PCI Monday, for heparin  Goal of Therapy:  Heparin level 0.3-0.7 units/ml Monitor platelets by anticoagulation protocol: Yes   Plan:  Increase Heparin 1400 units/hr Check heparin level in 8 hours.   Geannie Risen, PharmD, BCPS

## 2017-04-11 ENCOUNTER — Inpatient Hospital Stay (HOSPITAL_COMMUNITY): Payer: BLUE CROSS/BLUE SHIELD

## 2017-04-11 DIAGNOSIS — R079 Chest pain, unspecified: Secondary | ICD-10-CM

## 2017-04-11 LAB — ECHOCARDIOGRAM COMPLETE
EERAT: 10.32
EWDT: 183 ms
FS: 31 % (ref 28–44)
HEIGHTINCHES: 68 in
IVS/LV PW RATIO, ED: 0.91
LA diam end sys: 37 mm
LA diam index: 1.92 cm/m2
LASIZE: 37 mm
LAVOL: 43.5 mL
LAVOLA4C: 36.1 mL
LAVOLIN: 22.5 mL/m2
LV E/e'average: 10.32
LV e' LATERAL: 6.96 cm/s
LVEEMED: 10.32
LVOT SV: 103 mL
LVOT VTI: 24.7 cm
LVOT area: 4.15 cm2
LVOT diameter: 23 mm
LVOT peak vel: 109 cm/s
Lateral S' vel: 15.7 cm/s
MV Dec: 183
MV Peak grad: 2 mmHg
MV pk E vel: 71.8 m/s
MVPKAVEL: 72.8 m/s
PW: 9.55 mm — AB (ref 0.6–1.1)
RV TAPSE: 26.7 mm
RV sys press: 18 mmHg
Reg peak vel: 195 cm/s
TDI e' lateral: 6.96
TDI e' medial: 8.27
TR max vel: 195 cm/s
WEIGHTICAEL: 2715.2 [oz_av]

## 2017-04-11 LAB — CBC
HCT: 39.1 % (ref 39.0–52.0)
HEMOGLOBIN: 13.2 g/dL (ref 13.0–17.0)
MCH: 29.9 pg (ref 26.0–34.0)
MCHC: 33.8 g/dL (ref 30.0–36.0)
MCV: 88.5 fL (ref 78.0–100.0)
PLATELETS: 192 10*3/uL (ref 150–400)
RBC: 4.42 MIL/uL (ref 4.22–5.81)
RDW: 12.8 % (ref 11.5–15.5)
WBC: 8.2 10*3/uL (ref 4.0–10.5)

## 2017-04-11 LAB — HEPARIN LEVEL (UNFRACTIONATED): Heparin Unfractionated: 0.53 IU/mL (ref 0.30–0.70)

## 2017-04-11 MED ORDER — SODIUM CHLORIDE 0.9 % WEIGHT BASED INFUSION: 1.0000 mL/kg/h | INTRAVENOUS | Status: DC

## 2017-04-11 MED ORDER — SODIUM CHLORIDE 0.9 % IV SOLN: 250.0000 mL | INTRAVENOUS | Status: DC | PRN

## 2017-04-11 MED ORDER — SODIUM CHLORIDE 0.9 % WEIGHT BASED INFUSION
3.0000 mL/kg/h | INTRAVENOUS | Status: DC
  Administered 2017-04-12: 3 mL/kg/h via INTRAVENOUS

## 2017-04-11 MED ORDER — SODIUM CHLORIDE 0.9% FLUSH
3.0000 mL | Freq: Two times a day (BID) | INTRAVENOUS | Status: DC
  Administered 2017-04-11: 3 mL via INTRAVENOUS

## 2017-04-11 MED ORDER — SODIUM CHLORIDE 0.9% FLUSH: 3.0000 mL | INTRAVENOUS | Status: DC | PRN

## 2017-04-11 NOTE — H&P (View-Only) (Signed)
Progress Note  Patient Name: [name]. Date of Encounter: 04/11/2017  Primary Cardiologist: New to Dr. Anne Fu  Subjective   No chest pain overnight. For staged PCI tomorrow.  Inpatient Medications    Scheduled Meds: . aspirin  81 mg Oral Daily  . atorvastatin  80 mg Oral q1800  . carvedilol  6.25 mg Oral BID WC  . latanoprost  1 drop Both Eyes QHS  . sodium chloride flush  3 mL Intravenous Q12H  . sodium chloride flush  3 mL Intravenous Q12H  . ticagrelor  90 mg Oral BID   Continuous Infusions: . sodium chloride    . sodium chloride    . heparin 1,400 Units/hr (04/11/17 0351)   PRN Meds: sodium chloride, sodium chloride, acetaminophen, ALPRAZolam, nitroGLYCERIN, ondansetron (ZOFRAN) IV, sodium chloride flush, sodium chloride flush, zolpidem   Vital Signs    Vitals:   04/10/17 1210 04/10/17 2121 04/11/17 0523 04/11/17 0906  BP: (!) 116/59 108/66 106/70 115/67  Pulse:  78 73 79  Resp: (!) 23  18   Temp:  100.3 F (37.9 C) 98.3 F (36.8 C)   TempSrc:  Oral Oral   SpO2:  98% 93%   Weight:   169 lb 11.2 oz (77 kg)   Height:        Intake/Output Summary (Last 24 hours) at 04/11/2017 0942 Last data filed at 04/11/2017 0351 Gross per 24 hour  Intake 241.94 ml  Output 150 ml  Net 91.94 ml   Filed Weights   04/09/17 1233 04/10/17 0321 04/11/17 0523  Weight: 168 lb (76.2 kg) 173 lb 8 oz (78.7 kg) 169 lb 11.2 oz (77 kg)    Telemetry    SR  - Personally Reviewed  ECG    NSR - Personally Reviewed  Physical Exam   GEN: No acute distress.   Neck: No JVD Cardiac: RRR, no murmurs, rubs, or gallops. R radial cath site stable.  Respiratory: Clear to auscultation bilaterally. GI: Soft, nontender, non-distended  MS: No edema; No deformity. Neuro:  Nonfocal  Psych: Normal affect   Labs    Chemistry Recent Labs  Lab 04/09/17 0932 04/10/17 0539  NA 137 134*  K 3.5 3.5  CL 99* 103  CO2 25 21*  GLUCOSE 112* 93  BUN 12 15  CREATININE 1.08 1.01    CALCIUM 9.6 8.0*  PROT  --  6.1*  ALBUMIN  --  3.3*  AST  --  45*  ALT  --  29  ALKPHOS  --  47  BILITOT  --  1.5*  GFRNONAA >60 >60  GFRAA >60 >60  ANIONGAP 13 10     Hematology Recent Labs  Lab 04/09/17 0932 04/10/17 0539 04/11/17 0745  WBC 10.6* 8.8 8.2  RBC 5.28 4.31 4.42  HGB 15.8 12.7* 13.2  HCT 46.5 38.0* 39.1  MCV 88.1 88.2 88.5  MCH 29.9 29.5 29.9  MCHC 34.0 33.4 33.8  RDW 12.8 12.9 12.8  PLT 203 177 192    Cardiac Enzymes Recent Labs  Lab 04/09/17 1023 04/09/17 1825 04/09/17 2256 04/10/17 0539  TROPONINI 3.14* 31.37* 15.65* 8.83*    Recent Labs  Lab 04/09/17 0950  TROPIPOC 2.08*    Radiology    Dg Chest 2 View  Result Date: 04/09/2017 CLINICAL DATA:  Left-sided chest pain for the past 2 days. EXAM: CHEST  2 VIEW COMPARISON:  Chest x-ray dated 12/15/2013. FINDINGS: The heart size and mediastinal contours are within normal limits. Normal pulmonary vascularity.  No focal consolidation, pleural effusion, or pneumothorax. No acute osseous abnormality. Unchanged mild elevation of the right hemidiaphragm. IMPRESSION: No active cardiopulmonary disease. Electronically Signed   By: Obie Dredge M.D.   On: 04/09/2017 10:12    Cardiac Studies   CORONARY STENT INTERVENTION  LEFT HEART CATH AND CORONARY ANGIOGRAPHY  Conclusion                 Conclusions: 1. Significant two-vessel coronary artery disease with 80-90% mid LAD stenosis and occluded mid LCx. 2. Mild to moderate right coronary artery disease. Proximal and mid portions of the LAD and RCA are ectatic/aneurysmal. 3. Mildly to moderately reduced left ventricular contraction with mid anterolateral hypokinesis. Low normal LVEDP. 4. Successful PCI to mid LCx using Resolute Onyx 2.0 x 2.0 x 22 mm DES (post-dilated to 2.6 mm proximally) with 0% residual stenosis and TIMI-3 flow.  Recommendations: 1. Plan for staged PCI to LAD next week, as renal function allows. 2. ASA and ticagrelor given today;  recommend switching ticagrelor to clopidogrel prior to discharge. Continue warfarin, clopidogrel, and aspirin x 1 month, then discontinue aspirin and complete at least 12 months of warfarin and clopidogrel. 3. Aggressive secondary prevention     Patient Profile     72 y.o. 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 Ruled in for NSTEMI.   Assessment & Plan    1. NSTEMI - Peak of troponin 31.37. Treated with IV heparin. Cath showed 2-vessel CAD with 80-90% mid LAD stenosis and occluded mid LCx. Mild to moderate RCA disease. Moderately reduced LVEF with mid anterolateral hypokinesis. Low normal LVEDP/ S/p Successful PCI to mid LCx using Resolute Onyx. Plan for staged PCI of LAD Monday.  - started on ASA and Brillinta. Plan to change Brillinta to plavix prior to discharge.  - Plan to treat with triple therapy (ASA. Plavix and Warfarin) and then stop ASA after 1 month.  - Continue BB and statin.  -Scheduled for PCI tomorrow with Dr. Okey Dupre at 1:30 pm  2. HLD - 04/10/2017: Cholesterol 112; HDL 31; LDL Cholesterol 64; Triglycerides 87; VLDL 17  - LDL at goal (below 70). He will benefits from tight control. Lipitor started this admission.   3.  Long term (current) use of anticoagulants - chronic coumadin since DVT/PE 2009 - pt has never been asked about switching to DOAC  4. LV dysfunction - By cath. Pending echo. Euvolemic. Continue BB. Add ACE/ARB pending echo.    For questions or updates, please contact [facility] Please consult [email or web address] for contact info under Cardiology/STEMI.  Chrystie Nose, MD, [facility], FACP  [place]  [facility]  Medical Director of the Advanced Lipid Disorders &  Cardiovascular Risk Reduction Clinic Diplomate of the American Board of Clinical  Lipidology Attending Cardiologist  Direct Dial: [phone]  Fax: [phone]  Website:  [email or web address]  Chrystie Nose, MD  04/11/2017, 9:42 AM

## 2017-04-11 NOTE — Progress Notes (Signed)
  Echocardiogram 2D Echocardiogram has been performed.  [name] 04/11/2017, 2:49 PM

## 2017-04-11 NOTE — Progress Notes (Signed)
ANTICOAGULATION CONSULT NOTE - Follow Up Consult  Pharmacy Consult for Heparin Indication: chest pain/ACS  Allergies  Allergen Reactions  . Lyrica [Pregabalin]     "went out of head"    Patient Measurements: Height: 5\' 8"  (172.7 cm) Weight: 169 lb 11.2 oz (77 kg) IBW/kg (Calculated) : 68.4  Vital Signs: Temp: 98.3 F (36.8 C) (01/20 0523) Temp Source: Oral (01/20 0523) BP: 106/70 (01/20 0523) Pulse Rate: 73 (01/20 0523)  Labs: Recent Labs    04/09/17 0932 04/09/17 1000  04/09/17 1825 04/09/17 2256 04/10/17 0539 04/10/17 1259 04/10/17 2224 04/11/17 0745  HGB 15.8  --   --   --   --  12.7*  --   --  13.2  HCT 46.5  --   --   --   --  38.0*  --   --  39.1  PLT 203  --   --   --   --  177  --   --  192  LABPROT  --  20.1*  --   --   --   --   --   --   --   INR  --  1.73  --   --   --   --   --   --   --   HEPARINUNFRC  --   --   --   --   --   --  0.10* 0.22* 0.53  CREATININE 1.08  --   --   --   --  1.01  --   --   --   TROPONINI  --   --    < > 31.37* 15.65* 8.83*  --   --   --    < > = values in this interval not displayed.    Estimated Creatinine Clearance: 65.8 mL/min (by C-G formula based on SCr of 1.01 mg/dL).  Assessment: 71yom on warfarin pta for DVT/PE, transitioned to heparin for r/o ACS. He is now s/p cath found to have 2 vessel CAD. Received DES to LCx and planning for staged PCI to LAD on 1/21. Heparin to resume 8 hours post-sheath removal. Radial sheath removed at 1918 and TR to be deflated ~ 2130 per RN.  Heparin level this morning is at goal at 0.53. Hemoglobin and platelets are stable, wnl. No noted concerns with bleeding.   Goal of Therapy:  Heparin level 0.3-0.7 units/ml Monitor platelets by anticoagulation protocol: Yes   Plan:  Continue heparin infusion at 1400 units/ hour  Daily heparin level and CBC Monitor for s/sx of bleeding  Blake Divine, Pharm.D. PGY1 Pharmacy Resident 04/11/2017 8:54 AM Main Pharmacy: [phone]

## 2017-04-11 NOTE — Progress Notes (Signed)
Progress Note  Patient Name: [name]. Date of Encounter: 04/11/2017  Primary Cardiologist: New to Dr. Anne Fu  Subjective   No chest pain overnight. For staged PCI tomorrow.  Inpatient Medications    Scheduled Meds: . aspirin  81 mg Oral Daily  . atorvastatin  80 mg Oral q1800  . carvedilol  6.25 mg Oral BID WC  . latanoprost  1 drop Both Eyes QHS  . sodium chloride flush  3 mL Intravenous Q12H  . sodium chloride flush  3 mL Intravenous Q12H  . ticagrelor  90 mg Oral BID   Continuous Infusions: . sodium chloride    . sodium chloride    . heparin 1,400 Units/hr (04/11/17 0351)   PRN Meds: sodium chloride, sodium chloride, acetaminophen, ALPRAZolam, nitroGLYCERIN, ondansetron (ZOFRAN) IV, sodium chloride flush, sodium chloride flush, zolpidem   Vital Signs    Vitals:   04/10/17 1210 04/10/17 2121 04/11/17 0523 04/11/17 0906  BP: (!) 116/59 108/66 106/70 115/67  Pulse:  78 73 79  Resp: (!) 23  18   Temp:  100.3 F (37.9 C) 98.3 F (36.8 C)   TempSrc:  Oral Oral   SpO2:  98% 93%   Weight:   169 lb 11.2 oz (77 kg)   Height:        Intake/Output Summary (Last 24 hours) at 04/11/2017 0942 Last data filed at 04/11/2017 0351 Gross per 24 hour  Intake 241.94 ml  Output 150 ml  Net 91.94 ml   Filed Weights   04/09/17 1233 04/10/17 0321 04/11/17 0523  Weight: 168 lb (76.2 kg) 173 lb 8 oz (78.7 kg) 169 lb 11.2 oz (77 kg)    Telemetry    SR  - Personally Reviewed  ECG    NSR - Personally Reviewed  Physical Exam   GEN: No acute distress.   Neck: No JVD Cardiac: RRR, no murmurs, rubs, or gallops. R radial cath site stable.  Respiratory: Clear to auscultation bilaterally. GI: Soft, nontender, non-distended  MS: No edema; No deformity. Neuro:  Nonfocal  Psych: Normal affect   Labs    Chemistry Recent Labs  Lab 04/09/17 0932 04/10/17 0539  NA 137 134*  K 3.5 3.5  CL 99* 103  CO2 25 21*  GLUCOSE 112* 93  BUN 12 15  CREATININE 1.08 1.01    CALCIUM 9.6 8.0*  PROT  --  6.1*  ALBUMIN  --  3.3*  AST  --  45*  ALT  --  29  ALKPHOS  --  47  BILITOT  --  1.5*  GFRNONAA >60 >60  GFRAA >60 >60  ANIONGAP 13 10     Hematology Recent Labs  Lab 04/09/17 0932 04/10/17 0539 04/11/17 0745  WBC 10.6* 8.8 8.2  RBC 5.28 4.31 4.42  HGB 15.8 12.7* 13.2  HCT 46.5 38.0* 39.1  MCV 88.1 88.2 88.5  MCH 29.9 29.5 29.9  MCHC 34.0 33.4 33.8  RDW 12.8 12.9 12.8  PLT 203 177 192    Cardiac Enzymes Recent Labs  Lab 04/09/17 1023 04/09/17 1825 04/09/17 2256 04/10/17 0539  TROPONINI 3.14* 31.37* 15.65* 8.83*    Recent Labs  Lab 04/09/17 0950  TROPIPOC 2.08*    Radiology    Dg Chest 2 View  Result Date: 04/09/2017 CLINICAL DATA:  Left-sided chest pain for the past 2 days. EXAM: CHEST  2 VIEW COMPARISON:  Chest x-ray dated 12/15/2013. FINDINGS: The heart size and mediastinal contours are within normal limits. Normal pulmonary vascularity.  No focal consolidation, pleural effusion, or pneumothorax. No acute osseous abnormality. Unchanged mild elevation of the right hemidiaphragm. IMPRESSION: No active cardiopulmonary disease. Electronically Signed   By: Obie Dredge M.D.   On: 04/09/2017 10:12    Cardiac Studies   CORONARY STENT INTERVENTION  LEFT HEART CATH AND CORONARY ANGIOGRAPHY  Conclusion                 Conclusions: 1. Significant two-vessel coronary artery disease with 80-90% mid LAD stenosis and occluded mid LCx. 2. Mild to moderate right coronary artery disease. Proximal and mid portions of the LAD and RCA are ectatic/aneurysmal. 3. Mildly to moderately reduced left ventricular contraction with mid anterolateral hypokinesis. Low normal LVEDP. 4. Successful PCI to mid LCx using Resolute Onyx 2.0 x 2.0 x 22 mm DES (post-dilated to 2.6 mm proximally) with 0% residual stenosis and TIMI-3 flow.  Recommendations: 1. Plan for staged PCI to LAD next week, as renal function allows. 2. ASA and ticagrelor given today;  recommend switching ticagrelor to clopidogrel prior to discharge. Continue warfarin, clopidogrel, and aspirin x 1 month, then discontinue aspirin and complete at least 12 months of warfarin and clopidogrel. 3. Aggressive secondary prevention     Patient Profile     72 y.o. 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 Ruled in for NSTEMI.   Assessment & Plan    1. NSTEMI - Peak of troponin 31.37. Treated with IV heparin. Cath showed 2-vessel CAD with 80-90% mid LAD stenosis and occluded mid LCx. Mild to moderate RCA disease. Moderately reduced LVEF with mid anterolateral hypokinesis. Low normal LVEDP/ S/p Successful PCI to mid LCx using Resolute Onyx. Plan for staged PCI of LAD Monday.  - started on ASA and Brillinta. Plan to change Brillinta to plavix prior to discharge.  - Plan to treat with triple therapy (ASA. Plavix and Warfarin) and then stop ASA after 1 month.  - Continue BB and statin.  -Scheduled for PCI tomorrow with Dr. Okey Dupre at 1:30 pm  2. HLD - 04/10/2017: Cholesterol 112; HDL 31; LDL Cholesterol 64; Triglycerides 87; VLDL 17  - LDL at goal (below 70). He will benefits from tight control. Lipitor started this admission.   3.  Long term (current) use of anticoagulants - chronic coumadin since DVT/PE 2009 - pt has never been asked about switching to DOAC  4. LV dysfunction - By cath. Pending echo. Euvolemic. Continue BB. Add ACE/ARB pending echo.    For questions or updates, please contact [facility] Please consult [email or web address] for contact info under Cardiology/STEMI.  Chrystie Nose, MD, [facility], FACP  [place]  [facility]  Medical Director of the Advanced Lipid Disorders &  Cardiovascular Risk Reduction Clinic Diplomate of the American Board of Clinical  Lipidology Attending Cardiologist  Direct Dial: [phone]  Fax: [phone]  Website:  [email or web address]  Chrystie Nose, MD  04/11/2017, 9:42 AM

## 2017-04-11 NOTE — Plan of Care (Signed)
Pt up ad lib. Ambulates independently with ease. Educated on care of vascular access site. Verbalizes understanding of limitations and restrictions for right upper extremity.

## 2017-04-12 ENCOUNTER — Other Ambulatory Visit: Payer: Self-pay

## 2017-04-12 ENCOUNTER — Encounter (HOSPITAL_COMMUNITY)
Admission: EM | Disposition: A | Discharge status: Home / Self Care | Payer: Self-pay | Source: Home / Self Care | Attending: Cardiology

## 2017-04-12 ENCOUNTER — Encounter (HOSPITAL_COMMUNITY): Payer: Self-pay | Admitting: Internal Medicine

## 2017-04-12 ENCOUNTER — Telehealth: Payer: Self-pay | Admitting: Family Medicine

## 2017-04-12 DIAGNOSIS — I219 Acute myocardial infarction, unspecified: Secondary | ICD-10-CM

## 2017-04-12 HISTORY — PX: CORONARY STENT INTERVENTION: CATH118234

## 2017-04-12 HISTORY — PX: CORONARY ULTRASOUND/IVUS: CATH118244

## 2017-04-12 LAB — POCT ACTIVATED CLOTTING TIME
Activated Clotting Time: 279 seconds
Activated Clotting Time: 323 s
Activated Clotting Time: 235 seconds
Activated Clotting Time: 268 s

## 2017-04-12 LAB — CBC
HCT: 36.5 % — ABNORMAL LOW (ref 39.0–52.0)
HEMOGLOBIN: 12.1 g/dL — AB (ref 13.0–17.0)
MCH: 29 pg (ref 26.0–34.0)
MCHC: 33.2 g/dL (ref 30.0–36.0)
MCV: 87.5 fL (ref 78.0–100.0)
Platelets: 185 10*3/uL (ref 150–400)
RBC: 4.17 MIL/uL — AB (ref 4.22–5.81)
RDW: 12.7 % (ref 11.5–15.5)
WBC: 8.3 10*3/uL (ref 4.0–10.5)

## 2017-04-12 LAB — BASIC METABOLIC PANEL
Anion gap: 9 (ref 5–15)
BUN: 13 mg/dL (ref 6–20)
CO2: 22 mmol/L (ref 22–32)
Calcium: 8.3 mg/dL — ABNORMAL LOW (ref 8.9–10.3)
Chloride: 105 mmol/L (ref 101–111)
Creatinine, Ser: 1 mg/dL (ref 0.61–1.24)
Glucose, Bld: 92 mg/dL (ref 65–99)
POTASSIUM: 3.5 mmol/L (ref 3.5–5.1)
SODIUM: 136 mmol/L (ref 135–145)

## 2017-04-12 LAB — PROTIME-INR
INR: 1.15
PROTHROMBIN TIME: 14.6 s (ref 11.4–15.2)

## 2017-04-12 LAB — HEPARIN LEVEL (UNFRACTIONATED): HEPARIN UNFRACTIONATED: 0.45 [IU]/mL (ref 0.30–0.70)

## 2017-04-12 SURGERY — CORONARY STENT INTERVENTION
Anesthesia: LOCAL

## 2017-04-12 MED ORDER — FENTANYL CITRATE (PF) 100 MCG/2ML IJ SOLN
INTRAMUSCULAR | Status: DC | PRN
  Administered 2017-04-12: 25 ug via INTRAVENOUS

## 2017-04-12 MED ORDER — HEPARIN (PORCINE) IN NACL 2-0.9 UNIT/ML-% IJ SOLN
INTRAMUSCULAR | Status: AC | PRN
  Administered 2017-04-12: 1000 mL via INTRA_ARTERIAL

## 2017-04-12 MED ORDER — HEPARIN SODIUM (PORCINE) 1000 UNIT/ML IJ SOLN
INTRAMUSCULAR | Status: DC | PRN
  Administered 2017-04-12: 3000 [IU] via INTRAVENOUS
  Administered 2017-04-12: 2000 [IU] via INTRAVENOUS
  Administered 2017-04-12: 7000 [IU] via INTRAVENOUS

## 2017-04-12 MED ORDER — MIDAZOLAM HCL 2 MG/2ML IJ SOLN
INTRAMUSCULAR | Status: AC
  Filled 2017-04-12: qty 2

## 2017-04-12 MED ORDER — SODIUM CHLORIDE 0.9 % IV SOLN: 250.0000 mL | INTRAVENOUS | Status: DC | PRN

## 2017-04-12 MED ORDER — ENOXAPARIN SODIUM 80 MG/0.8ML ~~LOC~~ SOLN
80.0000 mg | Freq: Two times a day (BID) | SUBCUTANEOUS | Status: DC
  Filled 2017-04-12: qty 0.8

## 2017-04-12 MED ORDER — IOPAMIDOL (ISOVUE-370) INJECTION 76%
INTRAVENOUS | Status: AC
  Filled 2017-04-12: qty 125

## 2017-04-12 MED ORDER — HEPARIN (PORCINE) IN NACL 2-0.9 UNIT/ML-% IJ SOLN
INTRAMUSCULAR | Status: AC
  Filled 2017-04-12: qty 1000

## 2017-04-12 MED ORDER — NITROGLYCERIN 1 MG/10 ML FOR IR/CATH LAB
INTRA_ARTERIAL | Status: DC | PRN
  Administered 2017-04-12 (×2): 200 ug via INTRACORONARY

## 2017-04-12 MED ORDER — MIDAZOLAM HCL 2 MG/2ML IJ SOLN
INTRAMUSCULAR | Status: DC | PRN
  Administered 2017-04-12: 1 mg via INTRAVENOUS

## 2017-04-12 MED ORDER — LIDOCAINE HCL 1 % IJ SOLN
INTRAMUSCULAR | Status: AC
  Filled 2017-04-12: qty 20

## 2017-04-12 MED ORDER — LABETALOL HCL 5 MG/ML IV SOLN: 10.0000 mg | INTRAVENOUS | Status: AC | PRN

## 2017-04-12 MED ORDER — CLOPIDOGREL BISULFATE 75 MG PO TABS: 75.0000 mg | ORAL_TABLET | Freq: Every day | ORAL | Status: DC

## 2017-04-12 MED ORDER — SODIUM CHLORIDE 0.9% FLUSH: 3.0000 mL | Freq: Two times a day (BID) | INTRAVENOUS | Status: DC

## 2017-04-12 MED ORDER — LIDOCAINE HCL (PF) 1 % IJ SOLN
INTRAMUSCULAR | Status: DC | PRN
  Administered 2017-04-12: 1 mL

## 2017-04-12 MED ORDER — VERAPAMIL HCL 2.5 MG/ML IV SOLN
INTRAVENOUS | Status: DC | PRN
  Administered 2017-04-12: 15:00:00 via INTRA_ARTERIAL

## 2017-04-12 MED ORDER — TICAGRELOR 90 MG PO TABS
90.0000 mg | ORAL_TABLET | Freq: Once | ORAL | Status: AC
  Administered 2017-04-12: 90 mg via ORAL
  Filled 2017-04-12: qty 1

## 2017-04-12 MED ORDER — HEPARIN SODIUM (PORCINE) 1000 UNIT/ML IJ SOLN
INTRAMUSCULAR | Status: AC
  Filled 2017-04-12: qty 1

## 2017-04-12 MED ORDER — CLOPIDOGREL BISULFATE 75 MG PO TABS
300.0000 mg | ORAL_TABLET | Freq: Once | ORAL | Status: DC
  Filled 2017-04-12: qty 4

## 2017-04-12 MED ORDER — HEPARIN SODIUM (PORCINE) 5000 UNIT/ML IJ SOLN
5000.0000 [IU] | Freq: Three times a day (TID) | INTRAMUSCULAR | Status: AC
  Administered 2017-04-12: 22:00:00 5000 [IU] via SUBCUTANEOUS
  Filled 2017-04-12: qty 1

## 2017-04-12 MED ORDER — IOPAMIDOL (ISOVUE-370) INJECTION 76%
INTRAVENOUS | Status: AC
  Filled 2017-04-12: qty 50

## 2017-04-12 MED ORDER — SODIUM CHLORIDE 0.9 % IV SOLN
INTRAVENOUS | Status: AC
  Administered 2017-04-12: 18:00:00 via INTRAVENOUS

## 2017-04-12 MED ORDER — SODIUM CHLORIDE 0.9% FLUSH
3.0000 mL | INTRAVENOUS | Status: DC | PRN
  Administered 2017-04-12: 3 mL via INTRAVENOUS
  Filled 2017-04-12: qty 3

## 2017-04-12 MED ORDER — WARFARIN SODIUM 7.5 MG PO TABS
7.5000 mg | ORAL_TABLET | Freq: Once | ORAL | Status: AC
  Administered 2017-04-12: 18:00:00 7.5 mg via ORAL
  Filled 2017-04-12: qty 1

## 2017-04-12 MED ORDER — FENTANYL CITRATE (PF) 100 MCG/2ML IJ SOLN
INTRAMUSCULAR | Status: AC
  Filled 2017-04-12: qty 2

## 2017-04-12 MED ORDER — NITROGLYCERIN 1 MG/10 ML FOR IR/CATH LAB
INTRA_ARTERIAL | Status: AC
  Filled 2017-04-12: qty 10

## 2017-04-12 MED ORDER — HYDRALAZINE HCL 20 MG/ML IJ SOLN: 5.0000 mg | INTRAMUSCULAR | Status: AC | PRN

## 2017-04-12 MED ORDER — WARFARIN - PHARMACIST DOSING INPATIENT
Freq: Every day | Status: DC
  Administered 2017-04-12: 18:00:00

## 2017-04-12 MED ORDER — IOPAMIDOL (ISOVUE-370) INJECTION 76%
INTRAVENOUS | Status: DC | PRN
  Administered 2017-04-12: 145 mL via INTRA_ARTERIAL

## 2017-04-12 MED ORDER — VERAPAMIL HCL 2.5 MG/ML IV SOLN
INTRAVENOUS | Status: AC
  Filled 2017-04-12: qty 2

## 2017-04-12 SURGICAL SUPPLY — 21 items
BALLN WOLVERINE 2.50X10 (BALLOONS) ×2
BALLN ~~LOC~~ EMERGE MR 3.25X30 (BALLOONS) ×2
BALLN ~~LOC~~ EMERGE MR 3.75X8 (BALLOONS) ×2
BALLOON WOLVERINE 2.50X10 (BALLOONS) IMPLANT
BALLOON ~~LOC~~ EMERGE MR 3.25X30 (BALLOONS) IMPLANT
BALLOON ~~LOC~~ EMERGE MR 3.75X8 (BALLOONS) IMPLANT
CATH LAUNCHER 6FR EBU3.5 (CATHETERS) ×1 IMPLANT
CATH OPTICROSS 40MHZ (CATHETERS) ×1 IMPLANT
DEVICE RAD COMP TR BAND LRG (VASCULAR PRODUCTS) ×1 IMPLANT
GLIDESHEATH SLEND SS 6F .021 (SHEATH) ×1 IMPLANT
GUIDEWIRE INQWIRE 1.5J.035X260 (WIRE) IMPLANT
INQWIRE 1.5J .035X260CM (WIRE) ×2
KIT ENCORE 26 ADVANTAGE (KITS) ×1 IMPLANT
KIT HEART LEFT (KITS) ×2 IMPLANT
PACK CARDIAC CATHETERIZATION (CUSTOM PROCEDURE TRAY) ×2 IMPLANT
SLED PULL BACK IVUS (MISCELLANEOUS) ×1 IMPLANT
STENT RESOLUTE ONYX 2.75X38 (Permanent Stent) ×1 IMPLANT
STENT RESOLUTE ONYX 3.5X8 (Permanent Stent) ×1 IMPLANT
TRANSDUCER W/STOPCOCK (MISCELLANEOUS) ×2 IMPLANT
TUBING CIL FLEX 10 FLL-RA (TUBING) ×2 IMPLANT
WIRE RUNTHROUGH .014X180CM (WIRE) ×1 IMPLANT

## 2017-04-12 NOTE — Progress Notes (Signed)
Progress Note  Patient Name: [name]. Date of Encounter: 04/12/2017  Primary Cardiologist: Donato Schultz, MD   Subjective   Pt chest pain free, waiting for intervention today.  Inpatient Medications    Scheduled Meds: . aspirin  81 mg Oral Daily  . atorvastatin  80 mg Oral q1800  . carvedilol  6.25 mg Oral BID WC  . latanoprost  1 drop Both Eyes QHS  . sodium chloride flush  3 mL Intravenous Q12H  . sodium chloride flush  3 mL Intravenous Q12H  . sodium chloride flush  3 mL Intravenous Q12H  . ticagrelor  90 mg Oral BID   Continuous Infusions: . sodium chloride    . sodium chloride    . sodium chloride    . sodium chloride 1 mL/kg/hr (04/12/17 0656)  . heparin 1,400 Units/hr (04/11/17 1942)   PRN Meds: sodium chloride, sodium chloride, sodium chloride, acetaminophen, ALPRAZolam, nitroGLYCERIN, ondansetron (ZOFRAN) IV, sodium chloride flush, sodium chloride flush, sodium chloride flush, zolpidem   Vital Signs    Vitals:   04/11/17 1944 04/11/17 2110 04/12/17 0559 04/12/17 0600  BP: 123/75 (!) 108/50 111/67   Pulse: 72 73 68   Resp: 18  16   Temp:  99 F (37.2 C) 98.8 F (37.1 C)   TempSrc:  Oral Oral   SpO2:  94% 94%   Weight:    169 lb (76.7 kg)  Height:        Intake/Output Summary (Last 24 hours) at 04/12/2017 0709 Last data filed at 04/11/2017 2300 Gross per 24 hour  Intake 1188.1 ml  Output 900 ml  Net 288.1 ml   Filed Weights   04/10/17 0321 04/11/17 0523 04/12/17 0600  Weight: 173 lb 8 oz (78.7 kg) 169 lb 11.2 oz (77 kg) 169 lb (76.7 kg)    Telemetry    sinus - Personally Reviewed  ECG    sinus - Personally Reviewed  Physical Exam   GEN: No acute distress.   Neck: No JVD Cardiac: RRR, no murmurs, rubs, or gallops.  Respiratory: Clear to auscultation bilaterally. GI: Soft, nontender, non-distended  MS: No edema; No deformity. Neuro:  Nonfocal  Psych: Normal affect   Labs    Chemistry Recent Labs  Lab 04/09/17 0932  04/10/17 0539  NA 137 134*  K 3.5 3.5  CL 99* 103  CO2 25 21*  GLUCOSE 112* 93  BUN 12 15  CREATININE 1.08 1.01  CALCIUM 9.6 8.0*  PROT  --  6.1*  ALBUMIN  --  3.3*  AST  --  45*  ALT  --  29  ALKPHOS  --  47  BILITOT  --  1.5*  GFRNONAA >60 >60  GFRAA >60 >60  ANIONGAP 13 10     Hematology Recent Labs  Lab 04/10/17 0539 04/11/17 0745 04/12/17 0338  WBC 8.8 8.2 8.3  RBC 4.31 4.42 4.17*  HGB 12.7* 13.2 12.1*  HCT 38.0* 39.1 36.5*  MCV 88.2 88.5 87.5  MCH 29.5 29.9 29.0  MCHC 33.4 33.8 33.2  RDW 12.9 12.8 12.7  PLT 177 192 185    Cardiac Enzymes Recent Labs  Lab 04/09/17 1023 04/09/17 1825 04/09/17 2256 04/10/17 0539  TROPONINI 3.14* 31.37* 15.65* 8.83*    Recent Labs  Lab 04/09/17 0950  TROPIPOC 2.08*     BNPNo results for input(s): BNP, PROBNP in the last 168 hours.   DDimer No results for input(s): DDIMER in the last 168 hours.   Radiology  No results found.  Cardiac Studies   Echo 04/11/17: Study Conclusions - Left ventricle: The cavity size was normal. Wall thickness was   normal. Systolic function was normal. The estimated ejection   fraction was in the range of 60% to 65%. Wall motion was normal;   there were no regional wall motion abnormalities. Doppler   parameters are consistent with abnormal left ventricular   relaxation (grade 1 diastolic dysfunction). The E/e&' ratio is   between 8-15, suggesting indeterminate LV filling pressure. - Aortic valve: Trileaflet. Sclerosis without stenosis. There was   trivial regurgitation. - Mitral valve: Mildly thickened leaflets . There was trivial   regurgitation. - Left atrium: The atrium was mildly dilated. - Tricuspid valve: There was trivial regurgitation. - Pulmonary arteries: PA peak pressure: 18 mm Hg (S). - Inferior vena cava: The vessel was normal in size. The   respirophasic diameter changes were in the normal range (>= 50%),   consistent with normal central venous  pressure.  Impressions: - LVEF 60-65%, normal wall thickness, normal wall motion, grade 1  DD, indeterminate LV filling pressure, aortic valve sclerosis   with trivial AI, trivial MR, mild LAE, trivial TR, RVSP 18 mmHg,   normal IVC.   Heart cath 04/09/17: 1. Significant two-vessel coronary artery disease with 80-90% mid LAD stenosis and occluded mid LCx. 2. Mild to moderate right coronary artery disease. Proximal and mid portions of the LAD and RCA are ectatic/aneurysmal. 3. Mildly to moderately reduced left ventricular contraction with mid anterolateral hypokinesis. Low normal LVEDP. 4. Successful PCI to mid LCx using Resolute Onyx 2.0 x 2.0 x 22 mm DES (post-dilated to 2.6 mm proximally) with 0% residual stenosis and TIMI-3 flow.  Recommendations: 1. Plan for staged PCI to LAD next week, as renal function allows. 2. ASA and ticagrelor given today; recommend switching ticagrelor to clopidogrel prior to discharge. Continue warfarin, clopidogrel, and aspirin x 1 month, then discontinue aspirin and complete at least 12 months of warfarin and clopidogrel. 3. Aggressive secondary prevention.  Patient Profile     72 y.o. 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 Ruled in for NSTEMI. Pt underwent cath   Assessment & Plan    1. NSTEMI - Pt is scheduled to undergo PCI this afternoon - troponin peaked at 31.37, down-trended (8.83) - he is chest pain free - Continue ASA and brilinta for now - will switch brilinta to plavix in the setting of chronic coumadin therapy for DVT/PR in 2009 - Will D/C ASA after 1 month, continue plavix and coumadin - continue coreg   2. HLD - lipitor started - LDL 64   3. Hx of DVT/PE in 2009 - on chronic coumadin since 2009 - bridging with heparin -  discussed possible NOAC with patient - will defer to MD and PCP to discuss bleeding risk profile while on plavix and what his best anticoagulation option is    For questions or updates, please contact [facility] Please consult [email or web address] for contact info under Cardiology/STEMI.      Signed, Roe Rutherford Acel Natzke, PA  04/12/2017, 7:09 AM

## 2017-04-12 NOTE — Progress Notes (Signed)
ANTICOAGULATION CONSULT NOTE - Follow Up Consult  Pharmacy Consult for Heparin Indication: CAD  Allergies  Allergen Reactions  . Lyrica [Pregabalin]     "went out of head"    Patient Measurements: Height: 5\' 8"  (172.7 cm) Weight: 169 lb (76.7 kg) IBW/kg (Calculated) : 68.4 Heparin Dosing Weight:  76.2 kg  Vital Signs: Temp: 98.8 F (37.1 C) (01/21 0559) Temp Source: Oral (01/21 0559) BP: 111/67 (01/21 0559) Pulse Rate: 68 (01/21 0559)  Labs: Recent Labs    04/09/17 0932 04/09/17 1000  04/09/17 1825 04/09/17 2256 04/10/17 0539  04/10/17 2224 04/11/17 0745 04/12/17 0338  HGB 15.8  --   --   --   --  12.7*  --   --  13.2 12.1*  HCT 46.5  --   --   --   --  38.0*  --   --  39.1 36.5*  PLT 203  --   --   --   --  177  --   --  192 185  LABPROT  --  20.1*  --   --   --   --   --   --   --   --   INR  --  1.73  --   --   --   --   --   --   --   --   HEPARINUNFRC  --   --   --   --   --   --    < > 0.22* 0.53 0.45  CREATININE 1.08  --   --   --   --  1.01  --   --   --   --   TROPONINI  --   --    < > 31.37* 15.65* 8.83*  --   --   --   --    < > = values in this interval not displayed.    Estimated Creatinine Clearance: 65.8 mL/min (by C-G formula based on SCr of 1.01 mg/dL).   Assessment:  Anticoag: warfarin pta for hx dvt/pe now to iv hep for r/o ACS. HL 0.45 in goal. Hgb 13.2>12.1. Plts stable 185. PTA warfarin 2.5 mg Sat and 5 mg AOD with admit INR 1.73.   Goal of Therapy:  Heparin level 0.3-0.7 units/ml Monitor platelets by anticoagulation protocol: Yes   Plan:  Continue IV heparin at 1400 units/hr Daily CBC and HL  Cath today for PCI  Yarisbel Miranda S. Merilynn Finland, PharmD, BCPS Clinical Staff Pharmacist Pager [phone]  Misty Stanley Stillinger 04/12/2017,8:47 AM

## 2017-04-12 NOTE — Telephone Encounter (Signed)
Pt had a heart attack and is wanting to know if Dr. Brett Canales recommended him seeing [facility] here in [place] or if he should see someone in [place]. Pt is scheduled to be discharged tomorrow. Please advise.

## 2017-04-12 NOTE — Telephone Encounter (Signed)
I called and left a message to r/c. 

## 2017-04-12 NOTE — Progress Notes (Signed)
ANTICOAGULATION CONSULT NOTE - Follow Up Consult  Pharmacy Consult for Heparin Indication: CAD  Allergies  Allergen Reactions  . Lyrica [Pregabalin]     "went out of head"    Patient Measurements: Height: 5\' 8"  (172.7 cm) Weight: 169 lb (76.7 kg) IBW/kg (Calculated) : 68.4 Heparin Dosing Weight:  76.2 kg  Vital Signs: Temp: 97.8 F (36.6 C) (01/21 1715) Temp Source: Oral (01/21 1715) BP: 118/64 (01/21 1715) Pulse Rate: 68 (01/21 1715)  Labs: Recent Labs    04/09/17 1825 04/09/17 2256  04/10/17 0539  04/10/17 2224 04/11/17 0745 04/12/17 0338 04/12/17 1354  HGB  --   --    < > 12.7*  --   --  13.2 12.1*  --   HCT  --   --   --  38.0*  --   --  39.1 36.5*  --   PLT  --   --   --  177  --   --  192 185  --   LABPROT  --   --   --   --   --   --   --   --  14.6  INR  --   --   --   --   --   --   --   --  1.15  HEPARINUNFRC  --   --   --   --    < > 0.22* 0.53 0.45  --   CREATININE  --   --   --  1.01  --   --   --   --  1.00  TROPONINI 31.37* 15.65*  --  8.83*  --   --   --   --   --    < > = values in this interval not displayed.    Estimated Creatinine Clearance: 66.5 mL/min (by C-G formula based on SCr of 1 mg/dL).   Assessment: 72 yo male on chronic Coumadin for hx VTE.  On IV heparin prior to cath lab while Coumadin on hold.  Now s/p procedure.  Pharmacy asked to resume Coumadin tonight, and will start full-dose Lovenox in the morning until INR back over 2.  Goal of Therapy:  Heparin level 0.6-1.2 drawn 4 hrs after lovenox dose Monitor platelets by anticoagulation protocol: Yes   Plan:  Coumadin 7.5 mg x 1 tonight Lovenox 80 mg sq q 12 hrs (1 mg/kg rounded to nearest syringe size). Continue Lovenox until INR > 2. SQ heparin 5000 units x 1 dose at 10 pm tonight. Daily INR.  Tad Moore, BCPS  Clinical Pharmacist Pager [phone]  04/12/2017 5:35 PM

## 2017-04-12 NOTE — Progress Notes (Signed)
Patient arrived from Cath Lab to room 6C04.  A/O. Denies pain/SOB.  TR Band to right radial with 12cc air.  Site a Level 0.  Activity limitations reviewed with patient and family.  Oriented patient to unit.

## 2017-04-12 NOTE — Progress Notes (Deleted)
Patient bedrest up ambulated in hall with staff for 300 feet. No complaints of pain or discomfort right groin remains level 0.Will continue to monitor.

## 2017-04-12 NOTE — Telephone Encounter (Signed)
Tell him I'm aware and have been following his progress in the notes each day in the compouter. I think he should get identical care either way, all part of the same team of board certified excellent heart doctors. Really pt ptreference as far as location of care, same group of docs and the ones up here just as good.

## 2017-04-12 NOTE — Interval H&P Note (Signed)
History and Physical Interval Note:  04/12/2017 3:07 PM  [name].  has presented today for cardiac catheterization, with the diagnosis of NSTEMI. The various methods of treatment have been discussed with the patient and family. After consideration of risks, benefits and other options for treatment, the patient has consented to  Procedure(s): CORONARY STENT INTERVENTION (N/A) as a surgical intervention .  The patient's history has been reviewed, patient examined, no change in status, stable for surgery.  I have reviewed the patient's chart and labs.  Questions were answered to the patient's satisfaction.    Cath Lab Visit (complete for each Cath Lab visit)  Clinical Evaluation Leading to the Procedure:   ACS: Yes.   - staged PCI to LAD for significant 2-vessel coronary artery disease in the setting of NSTEMI.  Non-ACS:  N/A  [name]

## 2017-04-12 NOTE — Telephone Encounter (Signed)
Please advise 

## 2017-04-13 ENCOUNTER — Encounter: Payer: Self-pay | Admitting: Family Medicine

## 2017-04-13 ENCOUNTER — Encounter (HOSPITAL_COMMUNITY): Payer: Self-pay | Admitting: Internal Medicine

## 2017-04-13 ENCOUNTER — Telehealth: Payer: Self-pay | Admitting: Family Medicine

## 2017-04-13 DIAGNOSIS — I2510 Atherosclerotic heart disease of native coronary artery without angina pectoris: Secondary | ICD-10-CM

## 2017-04-13 DIAGNOSIS — Z955 Presence of coronary angioplasty implant and graft: Secondary | ICD-10-CM

## 2017-04-13 DIAGNOSIS — Z86711 Personal history of pulmonary embolism: Secondary | ICD-10-CM

## 2017-04-13 DIAGNOSIS — Z9861 Coronary angioplasty status: Secondary | ICD-10-CM

## 2017-04-13 LAB — BASIC METABOLIC PANEL
Anion gap: 10 (ref 5–15)
BUN: 18 mg/dL (ref 6–20)
CALCIUM: 8.2 mg/dL — AB (ref 8.9–10.3)
CO2: 19 mmol/L — ABNORMAL LOW (ref 22–32)
Chloride: 107 mmol/L (ref 101–111)
Creatinine, Ser: 0.94 mg/dL (ref 0.61–1.24)
GFR calc Af Amer: >60 mL/min (ref >60)
GLUCOSE: 103 mg/dL — AB (ref 65–99)
Potassium: 3.5 mmol/L (ref 3.5–5.1)
Sodium: 136 mmol/L (ref 135–145)

## 2017-04-13 LAB — CBC
HCT: 36.2 % — ABNORMAL LOW (ref 39.0–52.0)
Hemoglobin: 12.2 g/dL — ABNORMAL LOW (ref 13.0–17.0)
MCH: 29.5 pg (ref 26.0–34.0)
MCHC: 33.7 g/dL (ref 30.0–36.0)
MCV: 87.7 fL (ref 78.0–100.0)
Platelets: 210 10*3/uL (ref 150–400)
RBC: 4.13 MIL/uL — AB (ref 4.22–5.81)
RDW: 12.8 % (ref 11.5–15.5)
WBC: 6.5 10*3/uL (ref 4.0–10.5)

## 2017-04-13 LAB — PROTIME-INR
INR: 1.22
Prothrombin Time: 15.3 seconds — ABNORMAL HIGH (ref 11.4–15.2)

## 2017-04-13 MED ORDER — CLOPIDOGREL BISULFATE 75 MG PO TABS
300.0000 mg | ORAL_TABLET | Freq: Once | ORAL | Status: AC
  Administered 2017-04-13: 300 mg via ORAL
  Filled 2017-04-13: qty 4

## 2017-04-13 MED ORDER — ASPIRIN 81 MG PO CHEW: 81.0000 mg | CHEWABLE_TABLET | Freq: Every day | ORAL | 3 refills | Status: DC

## 2017-04-13 MED ORDER — CARVEDILOL 6.25 MG PO TABS: 6.2500 mg | ORAL_TABLET | Freq: Two times a day (BID) | ORAL | 3 refills | Status: DC

## 2017-04-13 MED ORDER — ATORVASTATIN CALCIUM 80 MG PO TABS: 80.0000 mg | ORAL_TABLET | Freq: Every day | ORAL | 3 refills | Status: DC

## 2017-04-13 MED ORDER — CLOPIDOGREL BISULFATE 75 MG PO TABS: 75.0000 mg | ORAL_TABLET | Freq: Every day | ORAL | Status: DC

## 2017-04-13 MED ORDER — CLOPIDOGREL BISULFATE 75 MG PO TABS: 300.0000 mg | ORAL_TABLET | Freq: Once | ORAL | Status: DC

## 2017-04-13 MED ORDER — CLOPIDOGREL BISULFATE 75 MG PO TABS: 75.0000 mg | ORAL_TABLET | Freq: Every day | ORAL | 3 refills | Status: DC

## 2017-04-13 MED ORDER — TICAGRELOR 90 MG PO TABS
90.0000 mg | ORAL_TABLET | Freq: Two times a day (BID) | ORAL | Status: DC
  Filled 2017-04-13: qty 1

## 2017-04-13 MED ORDER — NITROGLYCERIN 0.4 MG SL SUBL: 0.4000 mg | SUBLINGUAL_TABLET | SUBLINGUAL | 1 refills | Status: DC | PRN

## 2017-04-13 MED ORDER — WARFARIN SODIUM 7.5 MG PO TABS: 7.5000 mg | ORAL_TABLET | Freq: Once | ORAL | Status: DC

## 2017-04-13 NOTE — Progress Notes (Signed)
ANTICOAGULATION CONSULT NOTE - Follow Up Consult  Pharmacy Consult for Coumadin  Indication: h/o DVT/PE  Allergies  Allergen Reactions  . Lyrica [Pregabalin]     "went out of head"    Patient Measurements: Height: 5\' 8"  (172.7 cm) Weight: 167 lb 8.8 oz (76 kg) IBW/kg (Calculated) : 68.4  Vital Signs: Temp: 98.3 F (36.8 C) (01/22 0802) Temp Source: Oral (01/22 0802) BP: 114/65 (01/22 0802) Pulse Rate: 74 (01/22 0802)  Labs: Recent Labs    04/10/17 2224  04/11/17 0745 04/12/17 0338 04/12/17 1354 04/13/17 0357  HGB  --    < > 13.2 12.1*  --  12.2*  HCT  --   --  39.1 36.5*  --  36.2*  PLT  --   --  192 185  --  210  LABPROT  --   --   --   --  14.6 15.3*  INR  --   --   --   --  1.15 1.22  HEPARINUNFRC 0.22*  --  0.53 0.45  --   --   CREATININE  --   --   --   --  1.00 0.94   < > = values in this interval not displayed.    Estimated Creatinine Clearance: 70.7 mL/min (by C-G formula based on SCr of 0.94 mg/dL).   Assessment:  Anticoag: warfarin PTA for hx dvt/pe now to iv hep for r/o ACS. Hgb (baseline 15.8) 13.2>12.2. Plts ok 210, INR 1.15>1.22 PTA warfarin 2.5 mg Sat and 5 mg AOD with admit INR 1.73.  Goal of Therapy:  INR 2-3   Plan:  Lovenox 80mg  SQ q 12 Coumadin 7.5 mg po x 1 Daily CBC and HL  Cath today for PCI  Oralia Criger S. Merilynn Finland, PharmD, [facility] Clinical Staff Pharmacist Pager [phone]  Misty Stanley Stillinger 04/13/2017,9:58 AM

## 2017-04-13 NOTE — Progress Notes (Signed)
CARDIAC REHAB PHASE I   PRE:  Rate/Rhythm: 75 SR    BP: sitting 114/65    SaO2:   MODE:  Ambulation: 500 ft   POST:  Rate/Rhythm: 86 SR    BP: sitting 130/88     SaO2:   Tolerated well, no c/o. Ed completed/reviewed. Good reception. Will refer to [place] [facility]. Understands the importance of Brilinta or Plavix (deciding right now). [id number]  Harriet Masson CES, ACSM 04/13/2017 8:44 AM

## 2017-04-13 NOTE — Telephone Encounter (Signed)
defonitely needs [facility] so will see then plus inr (multi f u issues for [place] to [employer])

## 2017-04-13 NOTE — Telephone Encounter (Signed)
Referral made 

## 2017-04-13 NOTE — Discharge Summary (Signed)
Discharge Summary    Patient ID: [name].,  MRN: [id number], DOB/AGE: [birth year] 72 y.o.  Admit date: 04/09/2017 Discharge date: 04/13/2017  Primary Care Provider: Merlyn [name] Primary Cardiologist: Steven Kendall, MD  Discharge Diagnoses    Principal Problem:   NSTEMI (non-ST elevated myocardial infarction) [facility]) Active Problems:   Long term (current) use of anticoagulants   Overweight (BMI 25.0-29.9)   Dyslipidemia, goal LDL below 70   Status post coronary artery stent placement   History of pulmonary embolism   Allergies Allergies  Allergen Reactions  . Lyrica [Pregabalin]     "went out of head"    Diagnostic Studies/Procedures    Coronary stent intervention 04/12/17: Conclusions: 1. Aneurysmal LAD with sequential moderate to severe stenosis involving the proximal and mid portions of the vessel. 2. Patent stent in the mid LCx. 3. Successful IVUS-guided PCI to the mid LAD with placement of non-overlapping Resolute Onyx 3.5 x 8 mm (proximal) and Resolute Onyx 2.75 x 38 mm (distal) drug-eluting stents with 0% residual stenosis and TIMI-3 flow.  Recommendations: 1. Transition from ticagrelor to clopidogrel; will load with clopidogrel 300 mg x 1 tomorrow morning, followed by 75 mg daily thereafter. 2. Resume warfarin per pharmacy tonight. Patient and his family report that he has been bridged in the past for procedures requiring cessation of warfarin. If there is no evidence of bleeding or vascular injury tomorrow, Lovenox bridge should be started tomorrow morning per pharmacy. 3. Continue aspirin, clopidogrel, and warfarin x 1 month. If INR therapeutic/stable, aspirin can be discontinued at that time. 4. Aggressive secondary prevention.   Left heart cath 04/09/17: 4. Significant two-vessel coronary artery disease with 80-90% mid LAD stenosis and occluded mid LCx. 5. Mild to moderate right coronary artery disease. Proximal and mid portions of the LAD  and RCA are ectatic/aneurysmal. 6. Mildly to moderately reduced left ventricular contraction with mid anterolateral hypokinesis. Low normal LVEDP. 7. Successful PCI to mid LCx using Resolute Onyx 2.0 x 2.0 x 22 mm DES (post-dilated to 2.6 mm proximally) with 0% residual stenosis and TIMI-3 flow.  Recommendations: 5. Plan for staged PCI to LAD next week, as renal function allows. 6. ASA and ticagrelor given today; recommend switching ticagrelor to clopidogrel prior to discharge. Continue warfarin, clopidogrel, and aspirin x 1 month, then discontinue aspirin and complete at least 12 months of warfarin and clopidogrel. 7. Aggressive secondary prevention.   Echo 04/11/17: Study Conclusions - Left ventricle: The cavity size was normal. Wall thickness was normal. Systolic function was normal. The estimated ejection fraction was in the range of 60% to 65%. Wall motion was normal; there were no regional wall motion abnormalities. Doppler parameters are consistent with abnormal left ventricular relaxation (grade 1 diastolic dysfunction). The E/e&' ratio is between 8-15, suggesting indeterminate LV filling pressure. - Aortic valve: Trileaflet. Sclerosis without stenosis. There was trivial regurgitation. - Mitral valve: Mildly thickened leaflets . There was trivial regurgitation. - Left atrium: The atrium was mildly dilated. - Tricuspid valve: There was trivial regurgitation. - Pulmonary arteries: PA peak pressure: 18 mm Hg (S). - Inferior vena cava: The vessel was normal in size. The respirophasic diameter changes were in the normal range (>= 50%), consistent with normal central venous pressure.  Impressions: - LVEF 60-65%, normal wall thickness, normal wall motion, grade 1 DD, indeterminate LV filling pressure, aortic valve sclerosiswith trivial AI, trivial MR, mild LAE, trivial TR, RVSP 18 mmHg, normal IVC.    History of Present Illness  [name]. is a 72 y.o. male  with a history of bilat PE and DVT 10/2007 after immobility from back injury, associated w/ pain under L shoulder blade, (now on lifelong coumadin). FH CAD/CVA in his mother in her 28s. LDL has been elevated, not on rx.   [name] had onset of SSCP 2 days ago after eating a banana. He thought it was indigestion, took Rolaids and Tylenol without relief. The pain radiated to up under his L shoulder blade. It reached a 7-8/10 at its worst. No associated SOB, or diaphoresis. He had nausea but no vomiting. Now, he feels the pain only with movement or deep inspiration. 5/10 pain when he got up to BR.   Some worsening w/ position change, sitting up. A little worse w/ deep inspiration or cough. Never had this pain before, it is different from the PE pain.  No hx DOE, orthopnea or PND. Is active at work, builds cabinets. No recent travel, acute illness or immobility. He wears compression hose on L, because the leg will swell if he doesn't. No hx cath, remote stress test, > 10 years ago, was told it was ok.  He is compliant w/ coumadin, no doses missed, INR 2.7 last week.    Hospital Course     Consultants: none  Patient was admitted to cardiology and underwent staged PCI. During the first heart cath, DES was placed in LCx. He was started on ASA and brilinta. A second heart cath was performed with DES x 2 to aneurysmal mid LAD. Pt recovered well from the procedure.   On 04/13/17, brilinta was stopped and he was loaded with 300 mg plavix in the setting of chronic coumadin use for hx of PE. He will continue ASA, plavix 75 mg, and coumadin for 1 month, then D/C ASA. He will continue coumadin without lovenox bridge. He will call for coumadin clinic follow up this week.   Right radial access site without bleeding or hematoma. Continue to monitor after 300 mg plavix this morning. If stable, will discharge home. Pt has ambulated in halls without difficulty.   Pt and wife requested to see Dr. Dinara Lupu [name] in follow  up.   Please see this note except below regarding need for chronic coumadin/AC: "Case discussed with [name]' PCP, Dr. Gerda [name].  He graciously reviewed the patient's prior records.  He was evaluated by a hematologist who stated that he should be on lifelong anticoagulation, presumably due to the extent of clot burden.  We will proceed with ASA+clopidogrel+warfarin.  He will have him re-evaluated by hematology to see if this remains their recommendation in the setting of current guidelines.   [name] and his wife have requested for him to follow up with me.  He will follow up with his PCP this week for INR check.  Would aim for 2-2.5.  No Lovenox bridge."    _____________  Discharge Vitals Blood pressure 114/65, pulse 74, temperature 98.3 F (36.8 C), temperature source Oral, resp. rate (!) 22, height 5\' 8"  (1.727 m), weight 167 lb 8.8 oz (76 kg), SpO2 95 %.  Filed Weights   04/11/17 0523 04/12/17 0600 04/13/17 0417  Weight: 169 lb 11.2 oz (77 kg) 169 lb (76.7 kg) 167 lb 8.8 oz (76 kg)    Labs & Radiologic Studies    CBC Recent Labs    04/12/17 0338 04/13/17 0357  WBC 8.3 6.5  HGB 12.1* 12.2*  HCT 36.5* 36.2*  MCV 87.5 87.7  PLT 185  210   Basic Metabolic Panel Recent Labs    37/90/24 1354 04/13/17 0357  NA 136 136  K 3.5 3.5  CL 105 107  CO2 22 19*  GLUCOSE 92 103*  BUN 13 18  CREATININE 1.00 0.94  CALCIUM 8.3* 8.2*   Liver Function Tests No results for input(s): AST, ALT, ALKPHOS, BILITOT, PROT, ALBUMIN in the last 72 hours. No results for input(s): LIPASE, AMYLASE in the last 72 hours. Cardiac Enzymes No results for input(s): CKTOTAL, CKMB, CKMBINDEX, TROPONINI in the last 72 hours. BNP Invalid input(s): POCBNP D-Dimer No results for input(s): DDIMER in the last 72 hours. Hemoglobin A1C No results for input(s): HGBA1C in the last 72 hours. Fasting Lipid Panel No results for input(s): CHOL, HDL, LDLCALC, TRIG, CHOLHDL, LDLDIRECT in the last 72  hours. Thyroid Function Tests No results for input(s): TSH, T4TOTAL, T3FREE, THYROIDAB in the last 72 hours.  Invalid input(s): FREET3 _____________  Dg Chest 2 View  Result Date: 04/09/2017 CLINICAL DATA:  Left-sided chest pain for the past 2 days. EXAM: CHEST  2 VIEW COMPARISON:  Chest x-ray dated 12/15/2013. FINDINGS: The heart size and mediastinal contours are within normal limits. Normal pulmonary vascularity. No focal consolidation, pleural effusion, or pneumothorax. No acute osseous abnormality. Unchanged mild elevation of the right hemidiaphragm. IMPRESSION: No active cardiopulmonary disease. Electronically Signed   By: Obie Dredge M.D.   On: 04/09/2017 10:12   Disposition   Pt is being discharged home today in good condition.  Follow-up Plans & Appointments    Follow-up Information    Abelino Derrick, PA-C Follow up on 04/22/2017.   Specialties:  Cardiology, Radiology Why:  3:30 pm for TCM hospital follow up Contact information: [address] [place] [address] [phone]          Discharge Instructions    Amb Referral to Cardiac Rehabilitation   Complete by:  As directed    Diagnosis:   Coronary Stents NSTEMI PTCA     Diet - low sodium heart healthy   Complete by:  As directed    Discharge instructions   Complete by:  As directed    No driving for 2 days. No lifting over 5 lbs for 1 week. No sexual activity for 1 week. You may return to work in 1 week. Keep procedure site clean & dry. If you notice increased pain, swelling, bleeding or pus, call/return!  You may shower, but no soaking baths/hot tubs/pools for 1 week.   Increase activity slowly   Complete by:  As directed       Discharge Medications   Allergies as of 04/13/2017      Reactions   Lyrica [pregabalin]    "went out of head"      Medication List    TAKE these medications   acetaminophen 500 MG tablet Commonly known as:  TYLENOL Take 2 tablets (1,000 mg total) by mouth  every 8 (eight) hours.   aspirin 81 MG chewable tablet Chew 1 tablet (81 mg total) by mouth daily.   atorvastatin 80 MG tablet Commonly known as:  LIPITOR Take 1 tablet (80 mg total) by mouth daily at 6 PM.   carvedilol 6.25 MG tablet Commonly known as:  COREG Take 1 tablet (6.25 mg total) by mouth 2 (two) times daily with a meal.   clopidogrel 75 MG tablet Commonly known as:  PLAVIX Take 1 tablet (75 mg total) by mouth daily. Start taking on:  04/14/2017   latanoprost 0.005 %  ophthalmic solution Commonly known as:  XALATAN Place 1 drop into both eyes at bedtime.   nitroGLYCERIN 0.4 MG SL tablet Commonly known as:  NITROSTAT Place 1 tablet (0.4 mg total) under the tongue every 5 (five) minutes x 3 doses as needed for chest pain.   warfarin 5 MG tablet Commonly known as:  COUMADIN Take as directed. If you are unsure how to take this medication, talk to your nurse or doctor. Original instructions:  TAKE 1 TABLET DAILY What changed:    how much to take  how to take this  when to take this        Aspirin prescribed at discharge?  Yes High Intensity Statin Prescribed? (Lipitor 40-80mg  or Crestor 20-40mg ): Yes Beta Blocker Prescribed? Yes For EF <40%, was ACEI/ARB Prescribed? Yes ADP Receptor Inhibitor Prescribed? (i.e. Plavix etc.-Includes Medically Managed Patients): Yes For EF <40%, Aldosterone Inhibitor Prescribed? No: marginal pressure, nl EF Was EF assessed during THIS hospitalization? Yes Was Cardiac Rehab II ordered? (Included Medically managed Patients): Yes   Outstanding Labs/Studies   Started lipitor, check LFTs  Duration of Discharge Encounter   Greater than 30 minutes including physician time.  Signed, Roe Rutherford Roanna Reaves NP 04/13/2017, 9:52 AM

## 2017-04-13 NOTE — Care Management Note (Signed)
Case Management Note  Patient Details  Name: [name]. MRN: [id number] Date of Birth: 03-09-1946  Subjective/Objective:  From home, pta indep, s/p coronary stent intervention, will be on plavix.                  Action/Plan: DC home no needs.  Expected Discharge Date:  04/13/17               Expected Discharge Plan:  Home/Self Care  In-House Referral:     Discharge planning Services  CM Consult  Post Acute Care Choice:    Choice offered to:     DME Arranged:    DME Agency:     HH Arranged:    HH Agency:     Status of Service:  Completed, signed off  If discussed at [employer] of Stay Meetings, dates discussed:    Additional Comments:  Leone Haven, RN 04/13/2017, 10:09 AM

## 2017-04-13 NOTE — Research (Signed)
Spoke with patient to about [employer] Research study. He has decided to decline to participate because "he has a lot going on". I thanked him and his wife for their time.

## 2017-04-13 NOTE — Telephone Encounter (Signed)
Please advise if you think he needs a separate appt, or if while here on Friday to see you we can do the Inr also.

## 2017-04-13 NOTE — Telephone Encounter (Signed)
Wife is aware.per her request Dr. Duke Salvia group.

## 2017-04-13 NOTE — Telephone Encounter (Signed)
Wife called, he is being discharged today and needs INR checked within next 3 days.  I went ahead and scheduled him for a hospital f/u with Dr. Brett Canales on Friday because he hasn't been seen for a regular office visit since March 2018.  If he doesn't need a regular appointment then we can switch him over to a nurse's visit for INR only???  I told wife to come Friday at 10am unless we called her back and told her differently.   Wife can be reached at cell# [phone]

## 2017-04-13 NOTE — Discharge Instructions (Addendum)

## 2017-04-13 NOTE — Progress Notes (Signed)
Progress Note  Patient Name: [name]. Date of Encounter: 04/13/2017  Primary Cardiologist: Donato Schultz, MD   Subjective   Patient is feeling well; denies chest pain, SOB, and palpitations. Pt is ready for discharge home.  Inpatient Medications    Scheduled Meds: . aspirin  81 mg Oral Daily  . atorvastatin  80 mg Oral q1800  . carvedilol  6.25 mg Oral BID WC  . clopidogrel  300 mg Oral Once  . [START ON 04/14/2017] clopidogrel  75 mg Oral Daily  . enoxaparin (LOVENOX) injection  80 mg Subcutaneous Q12H  . latanoprost  1 drop Both Eyes QHS  . sodium chloride flush  3 mL Intravenous Q12H  . sodium chloride flush  3 mL Intravenous Q12H  . sodium chloride flush  3 mL Intravenous Q12H  . Warfarin - Pharmacist Dosing Inpatient   Does not apply q1800   Continuous Infusions: . sodium chloride    . sodium chloride    . sodium chloride     PRN Meds: sodium chloride, sodium chloride, sodium chloride, acetaminophen, ALPRAZolam, nitroGLYCERIN, ondansetron (ZOFRAN) IV, sodium chloride flush, sodium chloride flush, sodium chloride flush, zolpidem   Vital Signs    Vitals:   04/12/17 2006 04/12/17 2100 04/12/17 2202 04/13/17 0417  BP: 125/60 (!) 122/59 (!) 120/58 125/64  Pulse: 72 75 72 76  Resp: (!) 22 (!) 24  18  Temp: 99.2 F (37.3 C)   98.8 F (37.1 C)  TempSrc: Oral   Oral  SpO2: 94% 94%  95%  Weight:    167 lb 8.8 oz (76 kg)  Height:        Intake/Output Summary (Last 24 hours) at 04/13/2017 0709 Last data filed at 04/12/2017 1809 Gross per 24 hour  Intake 240 ml  Output -  Net 240 ml   Filed Weights   04/11/17 0523 04/12/17 0600 04/13/17 0417  Weight: 169 lb 11.2 oz (77 kg) 169 lb (76.7 kg) 167 lb 8.8 oz (76 kg)    Telemetry    Sinus  - Personally Reviewed  ECG    No new tracings - Personally Reviewed  Physical Exam   GEN: No acute distress.   Neck: No JVD Cardiac: RRR, no murmurs, rubs, or gallops.  Respiratory: Clear to auscultation  bilaterally. GI: Soft, nontender, non-distended  MS: No edema; No deformity. Neuro:  Nonfocal  Psych: Normal affect   Labs    Chemistry Recent Labs  Lab 04/10/17 0539 04/12/17 1354 04/13/17 0357  NA 134* 136 136  K 3.5 3.5 3.5  CL 103 105 107  CO2 21* 22 19*  GLUCOSE 93 92 103*  BUN 15 13 18   CREATININE 1.01 1.00 0.94  CALCIUM 8.0* 8.3* 8.2*  PROT 6.1*  --   --   ALBUMIN 3.3*  --   --   AST 45*  --   --   ALT 29  --   --   ALKPHOS 47  --   --   BILITOT 1.5*  --   --   GFRNONAA >60 >60 >60  GFRAA >60 >60 >60  ANIONGAP 10 9 10      Hematology Recent Labs  Lab 04/11/17 0745 04/12/17 0338 04/13/17 0357  WBC 8.2 8.3 6.5  RBC 4.42 4.17* 4.13*  HGB 13.2 12.1* 12.2*  HCT 39.1 36.5* 36.2*  MCV 88.5 87.5 87.7  MCH 29.9 29.0 29.5  MCHC 33.8 33.2 33.7  RDW 12.8 12.7 12.8  PLT 192 185 210  Cardiac Enzymes Recent Labs  Lab 04/09/17 1023 04/09/17 1825 04/09/17 2256 04/10/17 0539  TROPONINI 3.14* 31.37* 15.65* 8.83*    Recent Labs  Lab 04/09/17 0950  TROPIPOC 2.08*     BNPNo results for input(s): BNP, PROBNP in the last 168 hours.   DDimer No results for input(s): DDIMER in the last 168 hours.   Radiology    No results found.  Cardiac Studies   Coronary stent intervention 04/12/17: Conclusions: 1. Aneurysmal LAD with sequential moderate to severe stenosis involving the proximal and mid portions of the vessel. 2. Patent stent in the mid LCx. 3. Successful IVUS-guided PCI to the mid LAD with placement of non-overlapping Resolute Onyx 3.5 x 8 mm (proximal) and Resolute Onyx 2.75 x 38 mm (distal) drug-eluting stents with 0% residual stenosis and TIMI-3 flow.  Recommendations: 1. Transition from ticagrelor to clopidogrel; will load with clopidogrel 300 mg x 1 tomorrow morning, followed by 75 mg daily thereafter. 2. Resume warfarin per pharmacy tonight. Patient and his family report that he has been bridged in the past for procedures requiring cessation  of warfarin. If there is no evidence of bleeding or vascular injury tomorrow, Lovenox bridge should be started tomorrow morning per pharmacy. 3. Continue aspirin, clopidogrel, and warfarin x 1 month. If INR therapeutic/stable, aspirin can be discontinued at that time. 4. Aggressive secondary prevention.   Left heart cath 04/09/17: 4. Significant two-vessel coronary artery disease with 80-90% mid LAD stenosis and occluded mid LCx. 5. Mild to moderate right coronary artery disease. Proximal and mid portions of the LAD and RCA are ectatic/aneurysmal. 6. Mildly to moderately reduced left ventricular contraction with mid anterolateral hypokinesis. Low normal LVEDP. 7. Successful PCI to mid LCx using Resolute Onyx 2.0 x 2.0 x 22 mm DES (post-dilated to 2.6 mm proximally) with 0% residual stenosis and TIMI-3 flow.  Recommendations: 5. Plan for staged PCI to LAD next week, as renal function allows. 6. ASA and ticagrelor given today; recommend switching ticagrelor to clopidogrel prior to discharge. Continue warfarin, clopidogrel, and aspirin x 1 month, then discontinue aspirin and complete at least 12 months of warfarin and clopidogrel. 7. Aggressive secondary prevention.   Echo 04/11/17: Study Conclusions - Left ventricle: The cavity size was normal. Wall thickness was normal. Systolic function was normal. The estimated ejection  fraction was in the range of 60% to 65%. Wall motion was normal;   there were no regional wall motion abnormalities. Doppler  parameters are consistent with abnormal left ventricular  relaxation (grade 1 diastolic dysfunction). The E/e&' ratio is  between 8-15, suggesting indeterminate LV filling pressure. - Aortic valve: Trileaflet. Sclerosis without stenosis. There was trivial regurgitation. - Mitral valve: Mildly thickened leaflets . There was trivial   regurgitation. - Left atrium: The atrium was mildly dilated. - Tricuspid valve: There was trivial regurgitation. -  Pulmonary arteries: PA peak pressure: 18 mm Hg (S). - Inferior vena cava: The vessel was normal in size. The   respirophasic diameter changes were in the normal range (>= 50%),   consistent with normal central venous pressure.  Impressions: - LVEF 60-65%, normal wall thickness, normal wall motion, grade 1   DD, indeterminate LV filling pressure, aortic valve sclerosis   with trivial AI, trivial MR, mild LAE, trivial TR, RVSP 18 mmHg,   normal IVC.   Patient Profile     72 y.o. male with a history of bilat PE and DVT 10/2007 after immobility from back injury, associated w/ pain under L shoulder blade, (now  on lifelong coumadin), untreated hyperlipidemia and family hx of CAD/CVA presented for symptoms concerning for angina. Ruled in for NSTEMI. Pt underwent cath with staged PCI.  Assessment & Plan    1. NSTEMI - staged PCI with DES to LCx and DES x 2 to aneurysmal mid LAD - patient will switch from brilinta to plavix with a 300 mg plavix load this morning, then 75 mg thereafter - lovenox bridging to coumadin today - Pt will follow up with his regular coumadin clinic for INR check later this week. - right radial access site without bleeding or hematoma today - if no bleeding following plavix load, continue lovenox to coumadin bridge - will D/C ASA after 1 month and continue plavix and coumadin - continue coreg  2. HLD - continue new lipitor - LFTs at clinic follow up  3. Hx of DVT/PE 2009 - on chronic coumadin since 2009 - pt and wife do not seem interested in discussing NOAC - question need for ongoing anticoagulation - follow up with PCP    For questions or updates, please contact [facility] Please consult [email or web address] for contact info under Cardiology/STEMI.      Signed, Roe Rutherford Mekel [place], PA  04/13/2017, 7:09 AM

## 2017-04-16 ENCOUNTER — Ambulatory Visit (INDEPENDENT_AMBULATORY_CARE_PROVIDER_SITE_OTHER): Payer: BLUE CROSS/BLUE SHIELD | Admitting: Family Medicine

## 2017-04-16 ENCOUNTER — Encounter: Payer: Self-pay | Admitting: Family Medicine

## 2017-04-16 ENCOUNTER — Telehealth (HOSPITAL_COMMUNITY): Payer: Self-pay

## 2017-04-16 VITALS — BP 120/70 | Ht 66.75 in | Wt 174.0 lb

## 2017-04-16 DIAGNOSIS — Z7901 Long term (current) use of anticoagulants: Secondary | ICD-10-CM | POA: Diagnosis not present

## 2017-04-16 DIAGNOSIS — I219 Acute myocardial infarction, unspecified: Secondary | ICD-10-CM | POA: Diagnosis not present

## 2017-04-16 LAB — POCT INR: INR: 1.6

## 2017-04-16 MED FILL — Lidocaine HCl Local Inj 1%: INTRAMUSCULAR | Qty: 20 | Status: AC

## 2017-04-16 NOTE — Progress Notes (Signed)
   Subjective:    Patient ID: [name]., male    DOB: [birth year], 72 y.o.   MRN: [id number] Patient arrives office for follow-up of recent complete hospital discharge HPI  Patient is here today for a hospital follow up on Heart Attak and stent placements. And also here for INR check.Patient was told by the hospital Dr that he needed to take the coumadin 5 mg all days until levels are normal.  All hospital notes reviewed in presence of patient today.  Including consult notes.  Imaging reports etc.  Patient called here originally with chest discomfort.  Wanted to come here.  We strongly advised going directly to the emergency room.  Subsequent workup revealed fairly extensive coronary artery disease.  Patient received 2 stents.  Now he is on a combination of medications under the direction of cardiologist.  Reports fatigue and tiredness since the hospitalization.  Also there was concern regarding his lifelong anticoagulation.  I sent him to Dr. Laurie Panda many years ago who after assessment recommended he should be on lifelong anticoagulation.  We have provided this.  Patient had a extensive DVT with pulmonary embolus.  Also please see oncologist note.  Patient reports no chest pain currently no substantial shortness of breath.   Working toward card rehab I the future      Sticking with eds faithfully  Feeling fatigue and tired , but overall   Results for orders placed or performed in visit on 04/16/17  POCT INR  Result Value Ref Range   INR 1.6      Review of Systems No headache, no major weight loss or weight gain, no chest pain no back pain abdominal pain no change in bowel habits complete ROS otherwise negative     Objective:   Physical Exam Alert and oriented, vitals reviewed and stable, NAD ENT-TM's and ext canals WNL bilat via otoscopic exam Soft palate, tonsils and post pharynx WNL via oropharyngeal exam Neck-symmetric, no masses; thyroid nonpalpable and  nontender Pulmonary-no tachypnea or accessory muscle use; Clear without wheezes via auscultation Card--no abnrml murmurs, rhythm reg and rate WNL Carotid pulses symmetric, without bruits        Assessment & Plan:  Impression 1 coronary artery disease with multiple stents long-term" in regards to controlling chronic risk factors discussed at great length.  Long-term complications regarding patient's current cardiac status also discussed  2.  Anticoagulation.  INR adjusted today.  We will work on hematology referral to assess for ongoing need of anticoagulant.  Specifically Coumadin.  3.  Posthospitalization fatigue within normal limits discussed and to maintain  Follow-up as scheduled diet exercise discussed

## 2017-04-16 NOTE — Telephone Encounter (Signed)
Patients insurance is active and benefits verified through [facility] - No co-pay, no deductible, out of pocket amount of $7,350/$263.32 has been met, 25% co-insurance, and no pre-authorization is required. Passport/reference [id number]  Patient will be contacted and scheduled after their follow up appt with the cardiologist office upon review by the RN Navigator.

## 2017-04-22 ENCOUNTER — Ambulatory Visit (INDEPENDENT_AMBULATORY_CARE_PROVIDER_SITE_OTHER): Payer: BLUE CROSS/BLUE SHIELD | Admitting: *Deleted

## 2017-04-22 ENCOUNTER — Ambulatory Visit: Payer: BLUE CROSS/BLUE SHIELD | Admitting: Cardiology

## 2017-04-22 ENCOUNTER — Encounter: Payer: Self-pay | Admitting: Cardiology

## 2017-04-22 VITALS — BP 130/76 | HR 67 | Ht 67.0 in | Wt 175.0 lb

## 2017-04-22 DIAGNOSIS — E785 Hyperlipidemia, unspecified: Secondary | ICD-10-CM

## 2017-04-22 DIAGNOSIS — I214 Non-ST elevation (NSTEMI) myocardial infarction: Secondary | ICD-10-CM | POA: Diagnosis not present

## 2017-04-22 DIAGNOSIS — I252 Old myocardial infarction: Secondary | ICD-10-CM

## 2017-04-22 DIAGNOSIS — I2510 Atherosclerotic heart disease of native coronary artery without angina pectoris: Secondary | ICD-10-CM

## 2017-04-22 DIAGNOSIS — Z9861 Coronary angioplasty status: Secondary | ICD-10-CM | POA: Diagnosis not present

## 2017-04-22 DIAGNOSIS — Z7901 Long term (current) use of anticoagulants: Secondary | ICD-10-CM | POA: Diagnosis not present

## 2017-04-22 DIAGNOSIS — Z86711 Personal history of pulmonary embolism: Secondary | ICD-10-CM | POA: Diagnosis not present

## 2017-04-22 LAB — POCT INR: INR: 2.1

## 2017-04-22 NOTE — Assessment & Plan Note (Signed)
Life long anticoagulation per PCP

## 2017-04-22 NOTE — Patient Instructions (Signed)
Medication Instructions:  STOP Aspirin 81mg  on May 10, 2017 Continue Plavix and Warfarin after stopping Aspirin  Labwork: None   Testing/Procedures: None   Follow-Up: Your physician recommends that you schedule a follow-up appointment in: 3 months in [place]-location convenient for patient   You have been referred to CARDIAC REHAB AT [facility] STARTING IN MARCH 2019 DO NOT RETURN TO WORK UNTIL May 21, 2017.  Any Other Special Instructions Will Be Listed Below (If Applicable). If you need a refill on your cardiac medications before your next appointment, please call your pharmacy.

## 2017-04-22 NOTE — Assessment & Plan Note (Signed)
S/P NSTEMI 04/09/17- Troponin 30

## 2017-04-22 NOTE — Progress Notes (Signed)
04/22/2017 [name]   04-Sep-1945  [id number]  Primary Physician Gerda Diss, Vilinda Blanks, MD Primary Cardiologist: He will be established in [place]  HPI:  72 y/o male, works as a IT sales professional, from [place]. He presented 04/09/17 with a NSTEMI. He had staged PCI with a DES to his CFX (culprit lesion) on 04/09/17, followed by a staged PCI with DES to a mLAD and dLAD lesion. He has a residual 40% RCA to be treated medically. His EF by echo was 60-65%. His Troponin peaked at 31.  Other medical problems include a history of PE/DVT/. He is on chronic Coumadin. After consultation with the pt's PCP- Dr Gerda Diss, it was determined that life long Coumadin Rx had previously been recommended. He was discharged on ASA, Plavix, and Coumadin with plans to stop the Coumadin after 30 days.   The pt is in the office today for follow up. He has not had recurrent chest pain. He doesn't smoke, doesn't have DM, and his LDL was 64. He does have a family history of CAD, his mother had an MI at age 52.    Current Outpatient Medications  Medication Sig Dispense Refill  . acetaminophen (TYLENOL) 500 MG tablet Take 2 tablets (1,000 mg total) by mouth every 8 (eight) hours. 30 tablet 0  . aspirin 81 MG chewable tablet Chew 1 tablet (81 mg total) by mouth daily. 90 tablet 3  . atorvastatin (LIPITOR) 80 MG tablet Take 1 tablet (80 mg total) by mouth daily at 6 PM. 90 tablet 3  . carvedilol (COREG) 6.25 MG tablet Take 1 tablet (6.25 mg total) by mouth 2 (two) times daily with a meal. 180 tablet 3  . clopidogrel (PLAVIX) 75 MG tablet Take 1 tablet (75 mg total) by mouth daily. 90 tablet 3  . latanoprost (XALATAN) 0.005 % ophthalmic solution Place 1 drop into both eyes at bedtime.    . nitroGLYCERIN (NITROSTAT) 0.4 MG SL tablet Place 1 tablet (0.4 mg total) under the tongue every 5 (five) minutes x 3 doses as needed for chest pain. 25 tablet 1  . warfarin (COUMADIN) 5 MG tablet TAKE 1 TABLET DAILY (Patient taking  differently: TAKE 1 TABLET DAILY except on Tuesday taking 2.5mg ) 90 tablet 3   No current facility-administered medications for this visit.     Allergies  Allergen Reactions  . Lyrica [Pregabalin]     "went out of head"    Past Medical History:  Diagnosis Date  . Anticoagulated on Coumadin   . Avascular necrosis of hip (HCC)    LEFT  . DVT (deep venous thrombosis) (HCC) 2009  . Dyslipidemia, goal LDL below 70 04/09/2017  . Family history of anesthesia complication    BROTHER HAD MALIGNANT HYPOTHERMIA - PT HAS NOT HAD ANY PROBLEMS WITH ANESTHESIA    . History of kidney stones   . Malignant hyperthermia    PT'S BROTHER HAS HX MALIGNANT HYPERTHERMIA  . NSTEMI (non-ST elevated myocardial infarction) (HCC) 04/09/2017  . Pulmonary embolism (HCC) 2009  . Vertigo    NO RECENT PROBLEMS    Social History   Socioeconomic History  . Marital status: Married    Spouse name: Not on file  . Number of children: Not on file  . Years of education: Not on file  . Highest education level: Not on file  Social Needs  . Financial resource strain: Not on file  . Food insecurity - worry: Not on file  . Food insecurity - inability: Not  on file  . Transportation needs - medical: Not on file  . Transportation needs - non-medical: Not on file  Occupational History  . Occupation: self employed, Fish farm manager  Tobacco Use  . Smoking status: Never Smoker  . Smokeless tobacco: Never Used  Substance and Sexual Activity  . Alcohol use: No  . Drug use: No  . Sexual activity: Not on file  Other Topics Concern  . Not on file  Social History Narrative  . Not on file     Family History  Problem Relation Age of Onset  . Breast cancer Mother   . Hypertension Mother   . Hyperlipidemia Mother   . Heart attack Mother 28  . Hypertension Paternal Grandmother   . Diabetes Paternal Grandmother      Review of Systems: General: negative for chills, fever, night sweats or weight changes.    Cardiovascular: negative for chest pain, dyspnea on exertion, edema, orthopnea, palpitations, paroxysmal nocturnal dyspnea or shortness of breath Dermatological: negative for rash Respiratory: negative for cough or wheezing Urologic: negative for hematuria Abdominal: negative for nausea, vomiting, diarrhea, bright red blood per rectum, melena, or hematemesis Neurologic: negative for visual changes, syncope, or dizziness All other systems reviewed and are otherwise negative except as noted above.    Blood pressure 130/76, pulse 67, height 5\' 7"  (1.702 m), weight 175 lb (79.4 kg).  General appearance: alert, cooperative, no distress and stoic Neck: no carotid bruit and no JVD Lungs: clear to auscultation bilaterally Heart: regular rate and rhythm Extremities: extremities normal, atraumatic, no cyanosis or edema Skin: Skin color, texture, turgor normal. No rashes or lesions Neurologic: Grossly normal  EKG NSR  ASSESSMENT AND PLAN:   NSTEMI (non-ST elevated myocardial infarction) (HCC) S/P NSTEMI 04/09/17- Troponin 30  CAD- S/P PCI Pt had a staged PCI- CFX DES placed 04/09/17- mLAD, dLAD DES placed 04/12/17. Residual 40% RCA, normal LVF  History of pulmonary embolism Life long anticoagulation per PCP  Dyslipidemia, goal LDL below 70 LDL was 64 on admission 04/09/17 (not on statin Rx) Check Lipids and LFTs 3 months- consider decreasing Lipitor to 40 mg   PLAN  I discussed f/u with the pt. Since he sees Dr Gerda Diss in [place] he would like to f/u there. I told him he should not go back to work for a another 4 weeks. He can start Cardiac rehab in 4 weeks as well. He stop ASA after Feb 18th dose- then continue Plavix and Coumadin.  Corine Shelter PA-C 04/22/2017 4:18 PM

## 2017-04-22 NOTE — Assessment & Plan Note (Signed)
LDL was 64 on admission 04/09/17 (not on statin Rx) Check Lipids and LFTs 3 months- consider decreasing Lipitor to 40 mg

## 2017-04-22 NOTE — Assessment & Plan Note (Signed)
Pt had a staged PCI- CFX DES placed 04/09/17- mLAD, dLAD DES placed 04/12/17. Residual 40% RCA, normal LVF

## 2017-04-23 ENCOUNTER — Telehealth (HOSPITAL_COMMUNITY): Payer: Self-pay

## 2017-04-23 ENCOUNTER — Ambulatory Visit: Payer: BLUE CROSS/BLUE SHIELD

## 2017-04-23 ENCOUNTER — Ambulatory Visit: Payer: BLUE CROSS/BLUE SHIELD | Admitting: Internal Medicine

## 2017-04-23 NOTE — Telephone Encounter (Signed)
Patient returned phone call in regards to Cardiac Rehab - patient interested in the program. Scheduled orientation on 06/08/2017 at 7:30am. Patient will attend the 6:45am exc class.

## 2017-04-23 NOTE — Telephone Encounter (Signed)
Attempted to call patient in regards to Cardiac Rehab - lm on vm °

## 2017-04-29 ENCOUNTER — Ambulatory Visit: Payer: BLUE CROSS/BLUE SHIELD

## 2017-05-03 ENCOUNTER — Encounter: Payer: Self-pay | Admitting: Family Medicine

## 2017-05-14 ENCOUNTER — Telehealth: Payer: Self-pay | Admitting: Family Medicine

## 2017-05-14 DIAGNOSIS — E785 Hyperlipidemia, unspecified: Secondary | ICD-10-CM

## 2017-05-14 DIAGNOSIS — Z125 Encounter for screening for malignant neoplasm of prostate: Secondary | ICD-10-CM

## 2017-05-14 DIAGNOSIS — Z79899 Other long term (current) drug therapy: Secondary | ICD-10-CM

## 2017-05-14 DIAGNOSIS — Z0000 Encounter for general adult medical examination without abnormal findings: Secondary | ICD-10-CM

## 2017-05-14 NOTE — Telephone Encounter (Signed)
Orders put in. Pt notified.  

## 2017-05-14 NOTE — Telephone Encounter (Signed)
Pt is requesting lab orders to be sent over for an upcoming physical that is scheduled with Dr. Brett Canales. The last labs per epic was a hepatic on 06/01/2016. Pt is wanting to get these done on Thursday.

## 2017-05-14 NOTE — Telephone Encounter (Signed)
Lip liv m7 psa 

## 2017-05-20 ENCOUNTER — Ambulatory Visit (INDEPENDENT_AMBULATORY_CARE_PROVIDER_SITE_OTHER): Payer: BLUE CROSS/BLUE SHIELD

## 2017-05-20 DIAGNOSIS — Z79899 Other long term (current) drug therapy: Secondary | ICD-10-CM | POA: Diagnosis not present

## 2017-05-20 DIAGNOSIS — Z125 Encounter for screening for malignant neoplasm of prostate: Secondary | ICD-10-CM | POA: Diagnosis not present

## 2017-05-20 DIAGNOSIS — E785 Hyperlipidemia, unspecified: Secondary | ICD-10-CM | POA: Diagnosis not present

## 2017-05-20 DIAGNOSIS — Z0000 Encounter for general adult medical examination without abnormal findings: Secondary | ICD-10-CM | POA: Diagnosis not present

## 2017-05-20 DIAGNOSIS — Z7901 Long term (current) use of anticoagulants: Secondary | ICD-10-CM | POA: Diagnosis not present

## 2017-05-20 LAB — POCT INR: INR: 2.2

## 2017-05-20 NOTE — Patient Instructions (Signed)
Patient is 5 mg takes one whole pill all days,but Tues takes a 1/2.

## 2017-05-21 LAB — BASIC METABOLIC PANEL
BUN/Creatinine Ratio: 14 (ref 10–24)
BUN: 13 mg/dL (ref 8–27)
CHLORIDE: 103 mmol/L (ref 96–106)
CO2: 25 mmol/L (ref 20–29)
CREATININE: 0.95 mg/dL (ref 0.76–1.27)
Calcium: 9.1 mg/dL (ref 8.6–10.2)
GFR calc Af Amer: 93 mL/min/{1.73_m2} (ref >59)
GFR calc non Af Amer: 81 mL/min/{1.73_m2} (ref >59)
GLUCOSE: 85 mg/dL (ref 65–99)
Potassium: 4.1 mmol/L (ref 3.5–5.2)
Sodium: 142 mmol/L (ref 134–144)

## 2017-05-21 LAB — LIPID PANEL
CHOL/HDL RATIO: 2.8 ratio (ref 0.0–5.0)
Cholesterol, Total: 87 mg/dL — ABNORMAL LOW (ref 100–199)
HDL: 31 mg/dL — AB (ref >39)
LDL CALC: 40 mg/dL (ref 0–99)
TRIGLYCERIDES: 80 mg/dL (ref 0–149)
VLDL Cholesterol Cal: 16 mg/dL (ref 5–40)

## 2017-05-21 LAB — HEPATIC FUNCTION PANEL
ALT: 28 IU/L (ref 0–44)
AST: 25 IU/L (ref 0–40)
Albumin: 4.4 g/dL (ref 3.5–4.8)
Alkaline Phosphatase: 63 IU/L (ref 39–117)
Bilirubin Total: 1 mg/dL (ref 0.0–1.2)
Bilirubin, Direct: 0.27 mg/dL (ref 0.00–0.40)
TOTAL PROTEIN: 7.1 g/dL (ref 6.0–8.5)

## 2017-05-21 LAB — PSA: PROSTATE SPECIFIC AG, SERUM: 1.1 ng/mL (ref 0.0–4.0)

## 2017-05-27 ENCOUNTER — Inpatient Hospital Stay (HOSPITAL_COMMUNITY): Payer: BLUE CROSS/BLUE SHIELD | Attending: Internal Medicine | Admitting: Internal Medicine

## 2017-05-27 ENCOUNTER — Other Ambulatory Visit: Payer: Self-pay

## 2017-05-27 ENCOUNTER — Encounter (HOSPITAL_COMMUNITY): Payer: Self-pay | Admitting: Internal Medicine

## 2017-05-27 VITALS — BP 127/76 | HR 63 | Resp 16 | Ht 67.5 in | Wt 172.0 lb

## 2017-05-27 DIAGNOSIS — I252 Old myocardial infarction: Secondary | ICD-10-CM

## 2017-05-27 DIAGNOSIS — Z86711 Personal history of pulmonary embolism: Secondary | ICD-10-CM

## 2017-05-27 DIAGNOSIS — Z86718 Personal history of other venous thrombosis and embolism: Secondary | ICD-10-CM | POA: Diagnosis not present

## 2017-05-27 DIAGNOSIS — Z7901 Long term (current) use of anticoagulants: Secondary | ICD-10-CM | POA: Diagnosis not present

## 2017-05-27 NOTE — Progress Notes (Signed)
Referring physician [name]  Diagnosis History of pulmonary embolism - Plan: CT ANGIO CHEST PE W OR WO CONTRAST, US Venous Img Lower Bilateral  Staging Cancer Staging No matching staging information was found for the patient.  Assessment and Plan:  1.  PE and DVT.  72 year old male who was diagnosed with PE and DVT in 2009.  He has been maintained on Coumadin since that time.  He reports he has tolerated treatment without problems.  He was previously seen by Dr. Mariel Sleet and underwent hypercoagulable evaluation which was reportedly negative.  Review of labs done 02/09/2008 show lupus anticoagulant was negative, factor V Leiden was negative, homocystine was normal, beta-2 glycoprotein was negative.  Antithrombin III was normal, anticardiolipin antibodies were negative, prothrombin gene mutation was negative.  He reports he had a heart attack in January 2019  and his cardiologist and primary care physician were questioning why he remained on Coumadin.  He has been recommended for Plavix for a year.  He is not currently on aspirin.  Review of chart shows last CT angio chest done 11/11/2007 and showed bilateral PEs.  Most recent Doppler was done 10/14/2013 and showed chronic DVT in the left femoral vein.  Right lower extremity was negative.  The patient has concerns about coming off Coumadin.  He reports he had previously been told by Dr. Mariel Sleet he needed lifetime anticoagulation.  Last CT of the abdomen and pelvis was done on 02/08/2008 and showed no evidence of malignancy in the abdomen or pelvis.   The patient is seen today for consultation due to his history of PE and DVT.  Long talk held with the patient.  I have discussed with him that his most recent imaging of the chest which was done in 2009 showed bilateral PEs.  Most recent lower extremity Doppler done in 2015 showed chronic DVT in the left femoral vein.  He has undergone hypercoagulable evaluation which was negative.  I have discussed with  him the usual recommendations for anticoagulation are  for 6 months to a year of therapy.  Based on his prior imaging showing evidence of residual thrombosis he will be set up for repeat CT angios of the chest as well as bilateral lower extremity Doppler for follow-up evaluation.  If he is still showing evidence of thrombosis he will likely can be recommended for ongoing continued anticoagulation.  He will return to clinic in 2 weeks to go over his imaging.  All questions answered and he expressed understanding of the information presented.  INR 2.2 on recent labs done 04/2017.    2.  MI.  He reports this occurred in January 2019.  He is currently on Plavix and has been recommended for year of therapy.  3.  Family history of stroke.  He reports this occurred in his mother.  4.  Health maintenance.  He reports he has undergone colonoscopy in the past.    HPI: 72 year old male who was diagnosed with PE and DVT in 2009.  He has been maintained on Coumadin since that time.  He reports he has tolerated treatment without problems.  He was previously seen by Dr. Mariel Sleet and underwent hypercoagulable evaluation which was reportedly negative.  Review of labs done 02/09/2008 show lupus anticoagulant was negative, factor V Leiden was negative, homocystine was normal, beta-2 glycoprotein was negative.  Antithrombin III was normal, anticardiolipin antibodies were negative, prothrombin gene mutation was negative.  He reports he had a heart attack in January 2019  and his cardiologist and  primary care physician were questioning why he remained on Coumadin.  He has been recommended for Plavix for a year.  He is not currently on aspirin.  Review of chart shows last CT angio chest done 11/11/2007 and showed bilateral PEs.  Most recent Doppler was done 10/14/2013 and showed chronic DVT in the left femoral vein.  Right lower extremity was negative.  The patient has concerns about coming off Coumadin.  He reports he had  previously been told by Dr. Mariel Sleet he needed lifetime anticoagulation.  Last CT of the abdomen and pelvis was done on 02/08/2008 and showed no evidence of malignancy in the abdomen or pelvis.   The patient is seen today for consultation due to his history of PE and DVT.  Problem List Patient Active Problem List   Diagnosis Date Noted  . History of pulmonary embolism [Z86.711] 04/13/2017  . CAD- S/P PCI [I25.10, Z98.61]   . NSTEMI (non-ST elevated myocardial infarction) (HCC) [I21.4] 04/09/2017  . Dyslipidemia, goal LDL below 70 [E78.5] 04/09/2017  . Overweight (BMI 25.0-29.9) [E66.3] 05/01/2015  . S/P left THA, AA [Z96.649] 04/30/2015  . Status post total replacement of right hip [Z96.641] 12/22/2013  . Elevated PSA [R97.20] 01/18/2013  . Erectile dysfunction [N52.9] 01/03/2013  . Long term (current) use of anticoagulants [Z79.01] 06/25/2012    Past Medical History Past Medical History:  Diagnosis Date  . Anticoagulated on Coumadin   . Avascular necrosis of hip (HCC)    LEFT  . DVT (deep venous thrombosis) (HCC) 2009  . Dyslipidemia, goal LDL below 70 04/09/2017  . Family history of anesthesia complication    BROTHER HAD MALIGNANT HYPOTHERMIA - PT HAS NOT HAD ANY PROBLEMS WITH ANESTHESIA    . History of kidney stones   . Malignant hyperthermia    PT'S BROTHER HAS HX MALIGNANT HYPERTHERMIA  . NSTEMI (non-ST elevated myocardial infarction) (HCC) 04/09/2017  . Pulmonary embolism (HCC) 2009  . Vertigo    NO RECENT PROBLEMS    Past Surgical History Past Surgical History:  Procedure Laterality Date  . BACK SURGERY  2009   DISCECTOMY  . CERVICAL FUSION  2008  . CORONARY STENT INTERVENTION N/A 04/09/2017   Procedure: CORONARY STENT INTERVENTION;  Surgeon: Yvonne Kendall, MD;  Location: [facility];  Service: Cardiovascular;  Laterality: N/A;  . CORONARY STENT INTERVENTION N/A 04/12/2017   Procedure: CORONARY STENT INTERVENTION;  Surgeon: Yvonne Kendall, MD;  Location:  [facility];  Service: Cardiovascular;  Laterality: N/A;  . INTRAVASCULAR ULTRASOUND/IVUS N/A 04/12/2017   Procedure: Intravascular Ultrasound/IVUS;  Surgeon: Yvonne Kendall, MD;  Location: [facility];  Service: Cardiovascular;  Laterality: N/A;  . LEFT HEART CATH AND CORONARY ANGIOGRAPHY N/A 04/09/2017   Procedure: LEFT HEART CATH AND CORONARY ANGIOGRAPHY;  Surgeon: Yvonne Kendall, MD;  Location: [facility];  Service: Cardiovascular;  Laterality: N/A;  . TOTAL HIP ARTHROPLASTY Right 12/22/2013   Procedure: RIGHT TOTAL HIP ARTHROPLASTY ANTERIOR APPROACH;  Surgeon: Shelda Pal, MD;  Location: [facility];  Service: Orthopedics;  Laterality: Right;  . TOTAL HIP ARTHROPLASTY Left 04/30/2015   Procedure: LEFT TOTAL HIP ARTHROPLASTY ANTERIOR APPROACH;  Surgeon: Durene Romans, MD;  Location: [facility];  Service: Orthopedics;  Laterality: Left;    Family History Family History  Problem Relation Age of Onset  . Breast cancer Mother   . Hypertension Mother   . Hyperlipidemia Mother   . Heart attack Mother 74  . Stroke Mother   . Hypertension Paternal Grandmother   .  Diabetes Paternal Grandmother   . Cirrhosis Sister   . Cancer Brother      Social History  reports that  has never smoked. he has never used smokeless tobacco. He reports that he does not drink alcohol or use drugs.  Medications  Current Outpatient Medications:  .  acetaminophen (TYLENOL) 500 MG tablet, Take 2 tablets (1,000 mg total) by mouth every 8 (eight) hours., Disp: 30 tablet, Rfl: 0 .  atorvastatin (LIPITOR) 80 MG tablet, Take 1 tablet (80 mg total) by mouth daily at 6 PM., Disp: 90 tablet, Rfl: 3 .  carvedilol (COREG) 6.25 MG tablet, Take 1 tablet (6.25 mg total) by mouth 2 (two) times daily with a meal., Disp: 180 tablet, Rfl: 3 .  clopidogrel (PLAVIX) 75 MG tablet, Take 1 tablet (75 mg total) by mouth daily., Disp: 90 tablet, Rfl: 3 .  warfarin (COUMADIN) 5 MG tablet, TAKE 1 TABLET DAILY (Patient  taking differently: TAKE 1 TABLET DAILY except on Tuesday taking 2.5mg ), Disp: 90 tablet, Rfl: 3 .  aspirin 81 MG chewable tablet, Chew 1 tablet (81 mg total) by mouth daily. (Patient not taking: Reported on 05/27/2017), Disp: 90 tablet, Rfl: 3 .  nitroGLYCERIN (NITROSTAT) 0.4 MG SL tablet, Place 1 tablet (0.4 mg total) under the tongue every 5 (five) minutes x 3 doses as needed for chest pain. (Patient not taking: Reported on 05/27/2017), Disp: 25 tablet, Rfl: 1  Allergies Lyrica [pregabalin]  Review of Systems Review of Systems - Oncology ROS as per HPI otherwise 12 point ROS is negative other than minor bruising   Physical Exam  Vitals Wt Readings from Last 3 Encounters:  04/22/17 175 lb (79.4 kg)  04/16/17 174 lb (78.9 kg)  04/13/17 167 lb 8.8 oz (76 kg)   Temp Readings from Last 3 Encounters:  04/13/17 98.3 F (36.8 C) (Oral)  05/01/15 98.4 F (36.9 C) (Oral)  04/24/15 97.7 F (36.5 C) (Oral)   BP Readings from Last 3 Encounters:  04/22/17 130/76  04/16/17 120/70  04/13/17 114/65   Pulse Readings from Last 3 Encounters:  04/22/17 67  04/13/17 74  05/01/15 81   Constitutional: Well-developed, well-nourished, and in no distress.   HENT: Head: Normocephalic and atraumatic.  Mouth/Throat: No oropharyngeal exudate. Mucosa moist. Eyes: Pupils are equal, round, and reactive to light. Conjunctivae are normal. No scleral icterus.  Neck: Normal range of motion. Neck supple. No JVD present.  Cardiovascular: Normal rate, regular rhythm and normal heart sounds.  Exam reveals no gallop and no friction rub.   No murmur heard. Pulmonary/Chest: Effort normal and breath sounds normal. No respiratory distress. No wheezes.No rales.  Abdominal: Soft. Bowel sounds are normal. No distension. There is no tenderness. There is no guarding.  Musculoskeletal: No edema or tenderness.  Lymphadenopathy: No cervical, axillary or supraclavicular adenopathy.  Neurological: Alert and oriented to  person, place, and time. No cranial nerve deficit.  Skin: Skin is warm and dry. No rash noted. No erythema. No pallor.  Psychiatric: Affect and judgment normal.   Labs No visits with results within 3 Day(s) from this visit.  Latest known visit with results is:  Anti-coag visit on 05/20/2017  Component Date Value Ref Range Status  . INR 05/20/2017 2.2   Final     Pathology Orders Placed This Encounter  Procedures  . CT ANGIO CHEST PE W OR WO CONTRAST    Standing Status:   Future    Standing Expiration Date:   08/28/2018    Order Specific  Question:   If indicated for the ordered procedure, I authorize the administration of contrast media per Radiology protocol    Answer:   Yes    Order Specific Question:   Preferred imaging location?    Answer:   [facility]    Order Specific Question:   Radiology Contrast Protocol - do NOT remove file path    Answer:   [email or web address]    Order Specific Question:   Reason for Exam additional comments    Answer:   PE on last CTA.  Evaluate for resolution  . US Venous Img Lower Bilateral    Standing Status:   Future    Standing Expiration Date:   05/27/2018    Order Specific Question:   Reason for Exam (SYMPTOM  OR DIAGNOSIS REQUIRED)    Answer:   DVT LE.  Interval followeup    Order Specific Question:   Preferred imaging location?    Answer:   [facility]   Labs done 02/09/2008 show lupus anticoagulant was negative, factor V Leiden was negative, homocystine was normal, beta-2 glycoprotein was negative.  Antithrombin III was normal, anticardiolipin antibodies were negative, prothrombin gene mutation was negative  CTA done 11/11/2007: IMPRESSION: Bilateral pulmonary emboli with left basilar infarct.   Diffuse fatty infiltration of liver.   Cholelithiasis.   Bilateral LE doppler done 10/16/2013  IMPRESSION: No evidence of DVT in the right lower extremity.  There is occlusive thrombus in the left  femoral vein throughout its course, however it is diminutive in caliber suggesting chronic DVT    Ahmed Prima MD

## 2017-05-27 NOTE — Patient Instructions (Addendum)
[facility] at [facility] Discharge Instructions   You were seen today by Dr. Ahmed Prima. She discussed with you, your history and coumadin therapy. She would like to do a repeat US of your legs to check and make sure there are no clots. She would like to do a repeat CT angio as well. After the [place] and CT she will discuss taking you off coumadin therapy. Return in 2 weeks for follow up and review of test results.     Thank you for choosing [facility] at [facility] to provide your oncology and hematology care.  To afford each patient quality time with our provider, please arrive at least 15 minutes before your scheduled appointment time.    If you have a lab appointment with the Cancer Center please come in thru the  Main Entrance and check in at the main information desk  You need to re-schedule your appointment should you arrive 10 or more minutes late.  We strive to give you quality time with our providers, and arriving late affects you and other patients whose appointments are after yours.  Also, if you no show three or more times for appointments you may be dismissed from the clinic at the providers discretion.     Again, thank you for choosing [facility].  Our hope is that these requests will decrease the amount of time that you wait before being seen by our physicians.       _____________________________________________________________  Should you have questions after your visit to [facility], please contact our office at [phone] between the hours of 8:30 a.m. and 4:30 p.m.  Voicemails left after 4:30 p.m. will not be returned until the following business day.  For prescription refill requests, have your pharmacy contact our office.       Resources For Cancer Patients and their Caregivers ? American Cancer Society: Can assist with transportation, wigs, general needs, runs Look Good Feel Better.         251-522-8224 ? Cancer Care: Provides financial assistance, online support groups, medication/co-pay assistance.  1-800-813-HOPE (412)515-2366) ? [facility] Assists [place] cancer patients and their families through emotional , educational and financial support.  206 090 2076 ? [place] DSS Where to apply for food stamps, Medicaid and utility assistance. (240) 346-6484 ? [facility]: Transportation to medical appointments. 928-554-6868 ? Social Security Administration: May apply for disability if have a Stage IV cancer. (714)781-4872 (318) 275-3955 ? [employer], Disability and Transit Services: Assists with nutrition, care and transit needs. 6120636402  Cancer Center Support Programs:   > Cancer Support Group  2nd Tuesday of the month 1pm-2pm, Journey Room   > Creative Journey  3rd Tuesday of the month 1130am-1pm, Journey Room

## 2017-05-28 ENCOUNTER — Encounter: Payer: Self-pay | Admitting: Family Medicine

## 2017-05-28 ENCOUNTER — Telehealth (HOSPITAL_COMMUNITY): Payer: Self-pay

## 2017-05-28 ENCOUNTER — Ambulatory Visit (INDEPENDENT_AMBULATORY_CARE_PROVIDER_SITE_OTHER): Payer: BLUE CROSS/BLUE SHIELD | Admitting: Family Medicine

## 2017-05-28 VITALS — BP 134/78 | Ht 67.5 in | Wt 171.6 lb

## 2017-05-28 DIAGNOSIS — Z7901 Long term (current) use of anticoagulants: Secondary | ICD-10-CM

## 2017-05-28 DIAGNOSIS — E785 Hyperlipidemia, unspecified: Secondary | ICD-10-CM | POA: Diagnosis not present

## 2017-05-28 DIAGNOSIS — Z13220 Encounter for screening for lipoid disorders: Secondary | ICD-10-CM | POA: Diagnosis not present

## 2017-05-28 DIAGNOSIS — Z0001 Encounter for general adult medical examination with abnormal findings: Secondary | ICD-10-CM

## 2017-05-28 DIAGNOSIS — Z0000 Encounter for general adult medical examination without abnormal findings: Secondary | ICD-10-CM

## 2017-05-28 NOTE — Progress Notes (Signed)
Subjective:    Patient ID: [name]., male    DOB: 1945-11-06, 72 y.o.   MRN: [id number]  HPI The patient comes in today for a wellness visit.    A review of their health history was completed.  A review of medications was also completed.  Any needed refills; none  Eating habits: tries to be healthy  Falls/  MVA accidents in past few months: none  Regular exercise: rides stationary bicycle  Specialist pt sees on regular basis: none; seen hematologist yesterday  Preventative health issues were discussed.   Additional concerns:  None   Riding  Bicycle four or five times per wek   Diet overall pretty good eats wekll and stable,,  Results for orders placed or performed in visit on 05/20/17  POCT INR  Result Value Ref Range   INR 2.2     Notes aching  With sig squatting and walking, worse with movement     Hip  Both aching and hurting , uually with  Movement    Review of Systems  Constitutional: Negative for activity change, appetite change and fever.  HENT: Negative for congestion and rhinorrhea.   Eyes: Negative for discharge.  Respiratory: Negative for cough and wheezing.   Cardiovascular: Negative for chest pain.  Gastrointestinal: Negative for abdominal pain, blood in stool and vomiting.  Genitourinary: Negative for difficulty urinating and frequency.  Musculoskeletal: Negative for neck pain.  Skin: Negative for rash.  Allergic/Immunologic: Negative for environmental allergies and food allergies.  Neurological: Negative for weakness and headaches.  Psychiatric/Behavioral: Negative for agitation.  All other systems reviewed and are negative.      Objective:   Physical Exam  Constitutional: He appears well-developed and well-nourished.  HENT:  Head: Normocephalic and atraumatic.  Right Ear: External ear normal.  Left Ear: External ear normal.  Nose: Nose normal.  Mouth/Throat: Oropharynx is clear and moist.  Eyes: Right eye exhibits no  discharge. Left eye exhibits no discharge. No scleral icterus.  Neck: Normal range of motion. Neck supple. No thyromegaly present.  Cardiovascular: Normal rate, regular rhythm and normal heart sounds.  No murmur heard. Pulmonary/Chest: Effort normal and breath sounds normal. No respiratory distress. He has no wheezes.  Abdominal: Soft. Bowel sounds are normal. He exhibits no distension and no mass. There is no tenderness.  Genitourinary: Penis normal.  Genitourinary Comments: Prostate exam within normal limits   Musculoskeletal: Normal range of motion. He exhibits no edema.  Lymphadenopathy:    He has no cervical adenopathy.  Neurological: He is alert. He exhibits normal muscle tone. Coordination normal.  Skin: Skin is warm and dry. No erythema.  Psychiatric: He has a normal mood and affect. His behavior is normal. Judgment normal.          Assessment & Plan:  1Colonoscopy due t  20121 wellness.  Diet discussed.  Exercise discussed.  Colonoscopy due in 2021.  Encouraged shingles vaccine.  Patient to think about patient states is already had flu shot this year  2.  Coumadin concerns.  Discussed at length.  Reviewed hematologist discussion from earlier in the week.  Still await their final results multiple questions answered  3.  Hyperlipidemia.  Patient expresses achiness in the legs.  I pointed out to Ms. had bilateral hip replacement.  Long discussion held regarding the nature of potential side effects from statins along with the tremendous benefit he would receive if he stayed on his known heart history  4.  Coronary artery disease  discussed/blood work results discussed   ot thin aobut

## 2017-06-07 ENCOUNTER — Telehealth (HOSPITAL_COMMUNITY): Payer: Self-pay | Admitting: Pharmacy Technician

## 2017-06-07 NOTE — Telephone Encounter (Signed)
Cardiac Rehab Medication Review by a Pharmacist  Does the patient  feel that his/her medications are working for him/her?  yes  Has the patient been experiencing any side effects to the medications prescribed?  no  Does the patient measure his/her own blood pressure or blood glucose at home?  no   Does the patient have any problems obtaining medications due to transportation or finances?   no  Understanding of regimen: good Understanding of indications: fair Potential of compliance: good    Pharmacist comments: Pt reports no issues with his medications at this time, and feels that they are all working for him.  He does not monitor his blood pressure at home, but states his clinic BP within goal.    Dion Body 06/07/2017 4:35 PM

## 2017-06-08 ENCOUNTER — Encounter (HOSPITAL_COMMUNITY)
Admission: RE | Admit: 2017-06-08 | Discharge: 2017-06-08 | Disposition: A | Discharge status: Home / Self Care | Payer: BLUE CROSS/BLUE SHIELD | Source: Ambulatory Visit | Attending: Cardiology | Admitting: Cardiology

## 2017-06-08 VITALS — BP 128/72 | HR 73 | Ht 67.75 in | Wt 175.3 lb

## 2017-06-08 DIAGNOSIS — I214 Non-ST elevation (NSTEMI) myocardial infarction: Secondary | ICD-10-CM | POA: Insufficient documentation

## 2017-06-08 DIAGNOSIS — Z7901 Long term (current) use of anticoagulants: Secondary | ICD-10-CM | POA: Insufficient documentation

## 2017-06-08 DIAGNOSIS — Z955 Presence of coronary angioplasty implant and graft: Secondary | ICD-10-CM | POA: Diagnosis not present

## 2017-06-08 DIAGNOSIS — E785 Hyperlipidemia, unspecified: Secondary | ICD-10-CM | POA: Insufficient documentation

## 2017-06-08 DIAGNOSIS — Z7982 Long term (current) use of aspirin: Secondary | ICD-10-CM | POA: Diagnosis not present

## 2017-06-08 DIAGNOSIS — Z86718 Personal history of other venous thrombosis and embolism: Secondary | ICD-10-CM | POA: Diagnosis not present

## 2017-06-08 DIAGNOSIS — Z79899 Other long term (current) drug therapy: Secondary | ICD-10-CM | POA: Diagnosis not present

## 2017-06-08 NOTE — Progress Notes (Signed)
Cardiac Individual Treatment Plan  Patient Details  Name: [name]. MRN: [id number] Date of Birth: 18-May-1945 Referring Provider:     CARDIAC REHAB PHASE II ORIENTATION from 06/08/2017 in [facility] CARDIAC REHAB  Referring Provider  Donato Schultz MD      Initial Encounter Date:    CARDIAC REHAB PHASE II ORIENTATION from 06/08/2017 in [facility] CARDIAC REHAB  Date  06/08/17  Referring Provider  Donato Schultz MD      Visit Diagnosis: NSTEMI (non-ST elevated myocardial infarction) (HCC)  S/P drug eluting coronary stent placement  Patient's Home Medications on Admission:  Current Outpatient Medications:  .  acetaminophen (TYLENOL) 500 MG tablet, Take 2 tablets (1,000 mg total) by mouth every 8 (eight) hours., Disp: 30 tablet, Rfl: 0 .  aspirin 81 MG chewable tablet, Chew 1 tablet (81 mg total) by mouth daily. (Patient not taking: Reported on 06/07/2017), Disp: 90 tablet, Rfl: 3 .  atorvastatin (LIPITOR) 80 MG tablet, Take 1 tablet (80 mg total) by mouth daily at 6 PM., Disp: 90 tablet, Rfl: 3 .  carvedilol (COREG) 6.25 MG tablet, Take 1 tablet (6.25 mg total) by mouth 2 (two) times daily with a meal., Disp: 180 tablet, Rfl: 3 .  clopidogrel (PLAVIX) 75 MG tablet, Take 1 tablet (75 mg total) by mouth daily., Disp: 90 tablet, Rfl: 3 .  nitroGLYCERIN (NITROSTAT) 0.4 MG SL tablet, Place 1 tablet (0.4 mg total) under the tongue every 5 (five) minutes x 3 doses as needed for chest pain., Disp: 25 tablet, Rfl: 1 .  warfarin (COUMADIN) 5 MG tablet, TAKE 1 TABLET DAILY (Patient taking differently: TAKE 1 TABLET DAILY except on Tuesday taking 2.5mg ), Disp: 90 tablet, Rfl: 3  Past Medical History: Past Medical History:  Diagnosis Date  . Anticoagulated on Coumadin   . Avascular necrosis of hip (HCC)    LEFT  . DVT (deep venous thrombosis) (HCC) 2009  . Dyslipidemia, goal LDL below 70 04/09/2017  . Family history of anesthesia complication    BROTHER  HAD MALIGNANT HYPOTHERMIA - PT HAS NOT HAD ANY PROBLEMS WITH ANESTHESIA    . History of kidney stones   . Malignant hyperthermia    PT'S BROTHER HAS HX MALIGNANT HYPERTHERMIA  . NSTEMI (non-ST elevated myocardial infarction) (HCC) 04/09/2017  . Pulmonary embolism (HCC) 2009  . Vertigo    NO RECENT PROBLEMS    Tobacco Use: Social History   Tobacco Use  Smoking Status Never Smoker  Smokeless Tobacco Never Used    Labs: Recent Review Flowsheet Data    Labs for ITP Cardiac and Pulmonary Rehab Latest Ref Rng & Units 03/02/2015 03/09/2016 04/09/2017 04/10/2017 05/20/2017   Cholestrol 100 - 199 mg/dL - 409 - 811 91(Y)   LDLCALC 0 - 99 mg/dL - 782(N) - 64 40   HDL >39 mg/dL - 56(O) - 13(Y) 86(V)   Trlycerides 0 - 149 mg/dL - 784(O) - 87 80   Hemoglobin A1c 4.8 - 5.6 % - - 5.6 - -   TCO2 0 - 100 mmol/L 24 - - - -      Capillary Blood Glucose: No results found for: GLUCAP   Exercise Target Goals: Date: 06/08/17  Exercise Program Goal: Individual exercise prescription set using results from initial 6 min walk test and THRR while considering  patient's activity barriers and safety.   Exercise Prescription Goal: Initial exercise prescription builds to 30-45 minutes a day of aerobic activity, 2-3 days per week.  Home  exercise guidelines will be given to patient during program as part of exercise prescription that the participant will acknowledge.  Activity Barriers & Risk Stratification: Activity Barriers & Cardiac Risk Stratification - 06/08/17 1036      Activity Barriers & Cardiac Risk Stratification   Activity Barriers  Other (comment);Left Hip Replacement;Right Hip Replacement    Comments  Herniated disc. Rupture disc.    Cardiac Risk Stratification  High       6 Minute Walk: 6 Minute Walk    Row Name 06/08/17 1035         6 Minute Walk   Phase  Initial     Distance  1839 feet     Walk Time  6 minutes     # of Rest Breaks  0     MPH  3.5     METS  3.9     RPE  8      VO2 Peak  13.88     Symptoms  No     Resting HR  73 bpm     Resting BP  128/72     Resting Oxygen Saturation   98 %     Exercise Oxygen Saturation  during 6 min walk  99 %     Max Ex. HR  98 bpm     Max Ex. BP  144/70     2 Minute Post BP  122/70        Oxygen Initial Assessment:   Oxygen Re-Evaluation:   Oxygen Discharge (Final Oxygen Re-Evaluation):   Initial Exercise Prescription: Initial Exercise Prescription - 06/08/17 1100      Date of Initial Exercise RX and Referring Provider   Date  06/08/17    Referring Provider  Donato Schultz MD      Treadmill   MPH  3    Grade  1    Minutes  10    METs  3.71      Bike   Level  0.8    Minutes  10    METs  2.89      NuStep   Level  3    SPM  85    Minutes  10    METs  2.5      Prescription Details   Frequency (times per week)  3    Duration  Progress to 30 minutes of continuous aerobic without signs/symptoms of physical distress      Intensity   THRR 40-80% of Max Heartrate  60-120    Ratings of Perceived Exertion  11-13    Perceived Dyspnea  0-4      Progression   Progression  Continue to progress workloads to maintain intensity without signs/symptoms of physical distress.      Resistance Training   Training Prescription  Yes    Weight  4lbs    Reps  10-15       Perform Capillary Blood Glucose checks as needed.  Exercise Prescription Changes:   Exercise Comments:   Exercise Goals and Review: Exercise Goals    Row Name 06/08/17 1121             Exercise Goals   Increase Physical Activity  Yes       Intervention  Provide advice, education, support and counseling about physical activity/exercise needs.;Develop an individualized exercise prescription for aerobic and resistive training based on initial evaluation findings, risk stratification, comorbidities and participant's personal goals.       Expected Outcomes  Short  Term: Attend rehab on a regular basis to increase amount of physical  activity.;Long Term: Exercising regularly at least 3-5 days a week.;Long Term: Add in home exercise to make exercise part of routine and to increase amount of physical activity.       Increase Strength and Stamina  Yes       Intervention  Provide advice, education, support and counseling about physical activity/exercise needs.;Develop an individualized exercise prescription for aerobic and resistive training based on initial evaluation findings, risk stratification, comorbidities and participant's personal goals.       Expected Outcomes  Short Term: Increase workloads from initial exercise prescription for resistance, speed, and METs.;Short Term: Perform resistance training exercises routinely during rehab and add in resistance training at home;Long Term: Improve cardiorespiratory fitness, muscular endurance and strength as measured by increased METs and functional capacity ( )       Able to understand and use rate of perceived exertion (RPE) scale  Yes       Intervention  Provide education and explanation on how to use RPE scale       Expected Outcomes  Short Term: Able to use RPE daily in rehab to express subjective intensity level;Long Term:  Able to use RPE to guide intensity level when exercising independently       Knowledge and understanding of Target Heart Rate Range (THRR)  Yes       Intervention  Provide education and explanation of THRR including how the numbers were predicted and where they are located for reference       Expected Outcomes  Short Term: Able to state/look up THRR;Long Term: Able to use THRR to govern intensity when exercising independently;Short Term: Able to use daily as guideline for intensity in rehab       Able to check pulse independently  Yes       Intervention  Provide education and demonstration on how to check pulse in carotid and radial arteries.;Review the importance of being able to check your own pulse for safety during independent exercise       Expected  Outcomes  Short Term: Able to explain why pulse checking is important during independent exercise;Long Term: Able to check pulse independently and accurately       Understanding of Exercise Prescription  Yes       Intervention  Provide education, explanation, and written materials on patient's individual exercise prescription       Expected Outcomes  Short Term: Able to explain program exercise prescription;Long Term: Able to explain home exercise prescription to exercise independently          Exercise Goals Re-Evaluation :    Discharge Exercise Prescription (Final Exercise Prescription Changes):   Nutrition:  Target Goals: Understanding of nutrition guidelines, daily intake of sodium 1500mg , cholesterol 200mg , calories 30% from fat and 7% or less from saturated fats, daily to have 5 or more servings of fruits and vegetables.  Biometrics: Pre Biometrics - 06/08/17 1122      Pre Biometrics   Height  5' 7.75" (1.721 m)    Weight  175 lb 4.3 oz (79.5 kg)    Waist Circumference  36.5 inches    Hip Circumference  39 inches    Waist to Hip Ratio  0.94 %    BMI (Calculated)  26.84    Triceps Skinfold  12 mm    % Body Fat  24.8 %    Grip Strength  41.5 kg    Flexibility  8 in  Single Leg Stand  11.88 seconds        Nutrition Therapy Plan and Nutrition Goals: Nutrition Therapy & Goals - 06/08/17 0921      Nutrition Therapy   Diet  Heart Healthy    Drug/Food Interactions  Coumadin/Vit K      Personal Nutrition Goals   Nutrition Goal  Pt to describe the benefit of including fruits, vegetables, whole grains, and low-fat dairy products in a heart healthy meal plan.      Intervention Plan   Intervention  Prescribe, educate and counsel regarding individualized specific dietary modifications aiming towards targeted core components such as weight, hypertension, lipid management, diabetes, heart failure and other comorbidities.    Expected Outcomes  Short Term Goal: Understand  basic principles of dietary content, such as calories, fat, sodium, cholesterol and nutrients.;Long Term Goal: Adherence to prescribed nutrition plan.       Nutrition Assessments: Nutrition Assessments - 06/08/17 0921      MEDFICTS Scores   Pre Score  24       Nutrition Goals Re-Evaluation:   Nutrition Goals Re-Evaluation:   Nutrition Goals Discharge (Final Nutrition Goals Re-Evaluation):   Psychosocial: Target Goals: Acknowledge presence or absence of significant depression and/or stress, maximize coping skills, provide positive support system. Participant is able to verbalize types and ability to use techniques and skills needed for reducing stress and depression.  Initial Review & Psychosocial Screening: Initial Psych Review & Screening - 06/08/17 1337      Initial Review   Current issues with  None Identified      Family Dynamics   Good Support System?  Yes    Comments  Pt has support from wife       Barriers   Psychosocial barriers to participate in program  There are no identifiable barriers or psychosocial needs.      Screening Interventions   Interventions  Encouraged to exercise    Expected Outcomes  Short Term goal: Identification and review with participant of any Quality of Life or Depression concerns found by scoring the questionnaire.;Long Term goal: The participant improves quality of Life and PHQ9 Scores as seen by post scores and/or verbalization of changes       Quality of Life Scores: Quality of Life - 06/08/17 0748      Quality of Life Scores   Health/Function Pre  29.6 %    Socioeconomic Pre  30 %    Psych/Spiritual Pre  30 %    Family Pre  30 %    GLOBAL Pre  29.83 %      Scores of 19 and below usually indicate a poorer quality of life in these areas.  A difference of  2-3 points is a clinically meaningful difference.  A difference of 2-3 points in the total score of the Quality of Life Index has been associated with significant improvement  in overall quality of life, self-image, physical symptoms, and general health in studies assessing change in quality of life.  PHQ-9: Recent Review Flowsheet Data    There is no flowsheet data to display.     Interpretation of Total Score  Total Score Depression Severity:  1-4 = Minimal depression, 5-9 = Mild depression, 10-14 = Moderate depression, 15-19 = Moderately severe depression, 20-27 = Severe depression   Psychosocial Evaluation and Intervention:   Psychosocial Re-Evaluation:   Psychosocial Discharge (Final Psychosocial Re-Evaluation):   Vocational Rehabilitation: Provide vocational rehab assistance to qualifying candidates.   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       Education: Education Goals: Education classes will be provided on a weekly basis, covering required topics. Participant will state understanding/return demonstration of topics presented.  Learning Barriers/Preferences: Learning Barriers/Preferences - 06/08/17 1034      Learning Barriers/Preferences   Learning Barriers  None    Learning Preferences  Skilled Demonstration       Education Topics: Count Your Pulse:  -Group instruction provided by verbal instruction, demonstration, patient participation and written materials to support subject.  Instructors address importance of being able to find your pulse and how to count your pulse when at home without a heart monitor.  Patients get hands on experience counting their pulse with staff help and individually.   Heart Attack, Angina, and Risk Factor Modification:  -Group instruction provided by verbal instruction, video, and written materials to support subject.  Instructors address signs and symptoms of angina and heart attacks.    Also discuss  risk factors for heart disease and how to make changes to improve heart health risk factors.   Functional Fitness:  -Group instruction provided by verbal instruction, demonstration, patient participation, and written materials to support subject.  Instructors address safety measures for doing things around the house.  Discuss how to get up and down off the floor, how to pick things up properly, how to safely get out of a chair without assistance, and balance training.   Meditation and Mindfulness:  -Group instruction provided by verbal instruction, patient participation, and written materials to support subject.  Instructor addresses importance of mindfulness and meditation practice to help reduce stress and improve awareness.  Instructor also leads participants through a meditation exercise.    Stretching for Flexibility and Mobility:  -Group instruction provided by verbal instruction, patient participation, and written materials to support subject.  Instructors lead participants through series of stretches that are designed to increase flexibility thus improving mobility.  These stretches are additional exercise for major muscle groups that are typically performed during regular warm up and cool down.   Hands Only CPR:  -Group verbal, video, and participation provides a basic overview of AHA guidelines for community CPR. Role-play of emergencies allow participants the opportunity to practice calling for help and chest compression technique with discussion of AED use.   Hypertension: -Group verbal and written instruction that provides a basic overview of hypertension including the most recent diagnostic guidelines, risk factor reduction with self-care instructions and medication management.    Nutrition I class: Heart Healthy Eating:  -Group instruction provided by PowerPoint slides, verbal discussion, and written materials to support subject matter. The instructor gives an explanation and  review of the Therapeutic Lifestyle Changes diet recommendations, which includes a discussion on lipid goals, dietary fat, sodium, fiber, plant stanol/sterol esters, sugar, and the components of a well-balanced, healthy diet.   CARDIAC REHAB PHASE II ORIENTATION from 06/08/2017 in [facility] CARDIAC REHAB  Date  06/08/17  Educator  RD      Nutrition II class: Lifestyle Skills:  -Group instruction provided by PowerPoint slides, verbal discussion, and written materials to support subject matter. The instructor gives an explanation and review of label reading, grocery shopping for heart health, heart healthy recipe modifications, and ways to make healthier choices when eating out.   CARDIAC REHAB PHASE  II ORIENTATION from 06/08/2017 in [facility] CARDIAC REHAB  Date  06/08/17  Educator  RD      Diabetes Question & Answer:  -Group instruction provided by PowerPoint slides, verbal discussion, and written materials to support subject matter. The instructor gives an explanation and review of diabetes co-morbidities, pre- and post-prandial blood glucose goals, pre-exercise blood glucose goals, signs, symptoms, and treatment of hypoglycemia and hyperglycemia, and foot care basics.   Diabetes Blitz:  -Group instruction provided by PowerPoint slides, verbal discussion, and written materials to support subject matter. The instructor gives an explanation and review of the physiology behind type 1 and type 2 diabetes, diabetes medications and rational behind using different medications, pre- and post-prandial blood glucose recommendations and Hemoglobin A1c goals, diabetes diet, and exercise including blood glucose guidelines for exercising safely.    Portion Distortion:  -Group instruction provided by PowerPoint slides, verbal discussion, written materials, and food models to support subject matter. The instructor gives an explanation of serving size versus portion size,  changes in portions sizes over the last 20 years, and what consists of a serving from each food group.   Stress Management:  -Group instruction provided by verbal instruction, video, and written materials to support subject matter.  Instructors review role of stress in heart disease and how to cope with stress positively.     Exercising on Your Own:  -Group instruction provided by verbal instruction, power point, and written materials to support subject.  Instructors discuss benefits of exercise, components of exercise, frequency and intensity of exercise, and end points for exercise.  Also discuss use of nitroglycerin and activating EMS.  Review options of places to exercise outside of rehab.  Review guidelines for sex with heart disease.   Cardiac Drugs I:  -Group instruction provided by verbal instruction and written materials to support subject.  Instructor reviews cardiac drug classes: antiplatelets, anticoagulants, beta blockers, and statins.  Instructor discusses reasons, side effects, and lifestyle considerations for each drug class.   Cardiac Drugs II:  -Group instruction provided by verbal instruction and written materials to support subject.  Instructor reviews cardiac drug classes: angiotensin converting enzyme inhibitors (ACE-I), angiotensin II receptor blockers (ARBs), nitrates, and calcium channel blockers.  Instructor discusses reasons, side effects, and lifestyle considerations for each drug class.   Anatomy and Physiology of the Circulatory System:  Group verbal and written instruction and models provide basic cardiac anatomy and physiology, with the coronary electrical and arterial systems. Review of: AMI, Angina, Valve disease, Heart Failure, Peripheral Artery Disease, Cardiac Arrhythmia, Pacemakers, and the ICD.   Other Education:  -Group or individual verbal, written, or video instructions that support the educational goals of the cardiac rehab program.   Holiday  Eating Survival Tips:  -Group instruction provided by PowerPoint slides, verbal discussion, and written materials to support subject matter. The instructor gives patients tips, tricks, and techniques to help them not only survive but enjoy the holidays despite the onslaught of food that accompanies the holidays.   Knowledge Questionnaire Score: Knowledge Questionnaire Score - 06/08/17 0741      Knowledge Questionnaire Score   Pre Score  20/24       Core Components/Risk Factors/Patient Goals at Admission: Personal Goals and Risk Factors at Admission - 06/08/17 1112      Core Components/Risk Factors/Patient Goals on Admission   Lipids  Yes    Intervention  Provide education and support for participant on nutrition & aerobic/resistive exercise along with prescribed medications to achieve  LDL 70mg , HDL >40mg .    Expected Outcomes  Short Term: Participant states understanding of desired cholesterol values and is compliant with medications prescribed. Participant is following exercise prescription and nutrition guidelines.;Long Term: Cholesterol controlled with medications as prescribed, with individualized exercise RX and with personalized nutrition plan. Value goals: LDL < 70mg , HDL > 40 mg.       Core Components/Risk Factors/Patient Goals Review:    Core Components/Risk Factors/Patient Goals at Discharge (Final Review):    ITP Comments: ITP Comments    Row Name 06/08/17 0739           ITP Comments  Medical Director- Dr. Armanda Magic, MD          Comments:  Patient attended orientation from [phone] to 0908  to review rules and guidelines for program. Completed 6 minute walk test, Intitial ITP, and exercise prescription.  VSS. Telemetry-SR with slight ST depression. Pt asymptomatic with no complaints. Brief Psychosocial Assessment reveals no immediate barriers to participating in cardiac rehab,  Pt feels supportive in his rehab participation and is looking forward to returning next  week.  Alanson Aly, BSN Cardiac and Emergency planning/management officer

## 2017-06-08 NOTE — Progress Notes (Signed)
[name]. 72 y.o. male DOB: 06-07-1945 MRN: [id number]      Nutrition Note  1. NSTEMI (non-ST elevated myocardial infarction) (HCC)   2. S/P drug eluting coronary stent placement    Past Medical History:  Diagnosis Date  . Anticoagulated on Coumadin   . Avascular necrosis of hip (HCC)    LEFT  . DVT (deep venous thrombosis) (HCC) 2009  . Dyslipidemia, goal LDL below 70 04/09/2017  . Family history of anesthesia complication    BROTHER HAD MALIGNANT HYPOTHERMIA - PT HAS NOT HAD ANY PROBLEMS WITH ANESTHESIA    . History of kidney stones   . Malignant hyperthermia    PT'S BROTHER HAS HX MALIGNANT HYPERTHERMIA  . NSTEMI (non-ST elevated myocardial infarction) (HCC) 04/09/2017  . Pulmonary embolism (HCC) 2009  . Vertigo    NO RECENT PROBLEMS   Meds reviewed. Coumadin noted  HT: Ht Readings from Last 1 Encounters:  05/28/17 5' 7.5" (1.715 m)    WT: Wt Readings from Last 3 Encounters:  05/28/17 171 lb 9.6 oz (77.8 kg)  05/27/17 172 lb (78 kg)  04/22/17 175 lb (79.4 kg)     BMI 26.5   Current tobacco use? No   Labs:  Lipid Panel     Component Value Date/Time   CHOL 87 (L) 05/20/2017 0916   TRIG 80 05/20/2017 0916   HDL 31 (L) 05/20/2017 0916   CHOLHDL 2.8 05/20/2017 0916   CHOLHDL 3.6 04/10/2017 0539   VLDL 17 04/10/2017 0539   LDLCALC 40 05/20/2017 0916    Lab Results  Component Value Date   HGBA1C 5.6 04/09/2017   CBG (last 3)  No results for input(s): GLUCAP in the last 72 hours.  Nutrition Note Spoke with pt. Nutrition plan and goals reviewed with pt. Pt is following Step 2 of the Therapeutic Lifestyle Changes diet. Per discussion, pt does not eat many fruits/veggies. Pt does not consume dairy products often. Pt eats 2 meals/d (breakfast and lunch) and occasionally a snack (e.g. Nabs). Pt noted to be on Coumadin since 2009. Pt expressed understanding of the information reviewed. Pt aware of nutrition education classes offered and is unable to attend  nutrition classes due to "watching my grandchildren."   Nutrition Diagnosis ? Food-and nutrition-related knowledge deficit related to lack of exposure to information as related to diagnosis of: ? CVD   Nutrition Intervention ? Pt's individual nutrition plan and goals reviewed with pt. ? Consider adding a Calcium supplement with vitamin D ? Pt given handouts for: ? Nutrition I class ? Nutrition II class   Nutrition Goal(s):  ? Pt to describe the benefit of including fruits, vegetables, whole grains, and low-fat dairy products in a heart healthy meal plan.  Plan:  Pt to attend nutrition classes ? Portion Distortion  Will provide client-centered nutrition education as part of interdisciplinary care.   Monitor and evaluate progress toward nutrition goal with team.  Mickle Plumb, M.Ed, RD, LDN, CDE 06/08/2017 8:58 AM

## 2017-06-09 ENCOUNTER — Ambulatory Visit (HOSPITAL_COMMUNITY): Admission: RE | Admit: 2017-06-09 | Payer: BLUE CROSS/BLUE SHIELD | Source: Ambulatory Visit

## 2017-06-09 ENCOUNTER — Ambulatory Visit (HOSPITAL_COMMUNITY): Payer: BLUE CROSS/BLUE SHIELD

## 2017-06-14 ENCOUNTER — Encounter (HOSPITAL_COMMUNITY): Payer: Self-pay

## 2017-06-14 ENCOUNTER — Encounter (HOSPITAL_COMMUNITY)
Admission: RE | Admit: 2017-06-14 | Discharge: 2017-06-14 | Disposition: A | Discharge status: Home / Self Care | Payer: BLUE CROSS/BLUE SHIELD | Source: Ambulatory Visit | Attending: Cardiology | Admitting: Cardiology

## 2017-06-14 DIAGNOSIS — Z7901 Long term (current) use of anticoagulants: Secondary | ICD-10-CM | POA: Diagnosis not present

## 2017-06-14 DIAGNOSIS — Z955 Presence of coronary angioplasty implant and graft: Secondary | ICD-10-CM

## 2017-06-14 DIAGNOSIS — I214 Non-ST elevation (NSTEMI) myocardial infarction: Secondary | ICD-10-CM

## 2017-06-14 DIAGNOSIS — Z86718 Personal history of other venous thrombosis and embolism: Secondary | ICD-10-CM | POA: Diagnosis not present

## 2017-06-14 DIAGNOSIS — Z7982 Long term (current) use of aspirin: Secondary | ICD-10-CM | POA: Diagnosis not present

## 2017-06-14 DIAGNOSIS — E785 Hyperlipidemia, unspecified: Secondary | ICD-10-CM | POA: Diagnosis not present

## 2017-06-14 DIAGNOSIS — Z79899 Other long term (current) drug therapy: Secondary | ICD-10-CM | POA: Diagnosis not present

## 2017-06-14 NOTE — Progress Notes (Signed)
Daily Session Note  Patient Details  Name: [name]. MRN: [id number] Date of Birth: 07/22/1945 Referring Provider:     CARDIAC REHAB PHASE II ORIENTATION from 06/08/2017 in [place]  Referring Provider  Candee Furbish MD      Encounter Date: 06/14/2017  Check In: Session Check In - 06/14/17 0645      Check-In   Location  [facility]    Staff Present  Andi Hence, RN, Luisa Hart, RN, Mosie Epstein, MS,ACSM CEP, Exercise Physiologist;Other    Supervising physician immediately available to respond to emergencies  [facility] immediately available    Physician(s)  Dr. Lonny Prude    Medication changes reported      No    Fall or balance concerns reported     No    Tobacco Cessation  No Change    Warm-up and Cool-down  Performed as group-led instruction    Resistance Training Performed  Yes    VAD Patient?  No      Pain Assessment   Currently in Pain?  No/denies    Multiple Pain Sites  No       Capillary Blood Glucose: No results found for this or any previous visit (from the past 24 hour(s)).    Social History   Tobacco Use  Smoking Status Never Smoker  Smokeless Tobacco Never Used    Goals Met:  Exercise tolerated well  Goals Unmet:  Not Applicable  Comments: Pt started cardiac rehab today.  Pt tolerated light exercise without difficulty. VSS, telemetry-SR, asymptomatic. Tolerated well.  Medication list reconciled. Pt denies barriers to medicaiton compliance.  PSYCHOSOCIAL ASSESSMENT:  PHQ-0. Pt exhibits positive coping skills, hopeful outlook with supportive family. No psychosocial needs identified at this time, no psychosocial interventions necessary.   Pt oriented to exercise equipment and routine.    Understanding verbalized.    Dr. Fransico Him is Medical Director for Cardiac Rehab at [facility].

## 2017-06-15 ENCOUNTER — Ambulatory Visit (HOSPITAL_COMMUNITY): Payer: BLUE CROSS/BLUE SHIELD | Admitting: Internal Medicine

## 2017-06-16 ENCOUNTER — Encounter (HOSPITAL_COMMUNITY)
Admission: RE | Admit: 2017-06-16 | Discharge: 2017-06-16 | Disposition: A | Discharge status: Home / Self Care | Payer: BLUE CROSS/BLUE SHIELD | Source: Ambulatory Visit | Attending: Cardiology | Admitting: Cardiology

## 2017-06-16 DIAGNOSIS — Z955 Presence of coronary angioplasty implant and graft: Secondary | ICD-10-CM | POA: Diagnosis not present

## 2017-06-16 DIAGNOSIS — Z86718 Personal history of other venous thrombosis and embolism: Secondary | ICD-10-CM | POA: Diagnosis not present

## 2017-06-16 DIAGNOSIS — I214 Non-ST elevation (NSTEMI) myocardial infarction: Secondary | ICD-10-CM | POA: Diagnosis not present

## 2017-06-16 DIAGNOSIS — Z79899 Other long term (current) drug therapy: Secondary | ICD-10-CM | POA: Diagnosis not present

## 2017-06-16 DIAGNOSIS — Z7901 Long term (current) use of anticoagulants: Secondary | ICD-10-CM | POA: Diagnosis not present

## 2017-06-16 DIAGNOSIS — E785 Hyperlipidemia, unspecified: Secondary | ICD-10-CM | POA: Diagnosis not present

## 2017-06-16 DIAGNOSIS — Z7982 Long term (current) use of aspirin: Secondary | ICD-10-CM | POA: Diagnosis not present

## 2017-06-17 ENCOUNTER — Ambulatory Visit (INDEPENDENT_AMBULATORY_CARE_PROVIDER_SITE_OTHER): Payer: BLUE CROSS/BLUE SHIELD

## 2017-06-17 DIAGNOSIS — Z7901 Long term (current) use of anticoagulants: Secondary | ICD-10-CM

## 2017-06-17 LAB — POCT INR: INR: 2.3

## 2017-06-17 NOTE — Patient Instructions (Signed)
Continue the coumadin 5 mg take one whole tablet daily except on Tuesdays take 1/2. Pt states understanding.

## 2017-06-18 ENCOUNTER — Encounter (HOSPITAL_COMMUNITY)
Admission: RE | Admit: 2017-06-18 | Discharge: 2017-06-18 | Disposition: A | Discharge status: Home / Self Care | Payer: BLUE CROSS/BLUE SHIELD | Source: Ambulatory Visit | Attending: Cardiology | Admitting: Cardiology

## 2017-06-18 DIAGNOSIS — Z7982 Long term (current) use of aspirin: Secondary | ICD-10-CM | POA: Diagnosis not present

## 2017-06-18 DIAGNOSIS — Z955 Presence of coronary angioplasty implant and graft: Secondary | ICD-10-CM

## 2017-06-18 DIAGNOSIS — I214 Non-ST elevation (NSTEMI) myocardial infarction: Secondary | ICD-10-CM

## 2017-06-18 DIAGNOSIS — Z79899 Other long term (current) drug therapy: Secondary | ICD-10-CM | POA: Diagnosis not present

## 2017-06-18 DIAGNOSIS — Z86718 Personal history of other venous thrombosis and embolism: Secondary | ICD-10-CM | POA: Diagnosis not present

## 2017-06-18 DIAGNOSIS — E785 Hyperlipidemia, unspecified: Secondary | ICD-10-CM | POA: Diagnosis not present

## 2017-06-18 DIAGNOSIS — Z7901 Long term (current) use of anticoagulants: Secondary | ICD-10-CM | POA: Diagnosis not present

## 2017-06-21 ENCOUNTER — Ambulatory Visit (HOSPITAL_COMMUNITY)
Admission: RE | Admit: 2017-06-21 | Discharge: 2017-06-21 | Disposition: A | Discharge status: Home / Self Care | Payer: BLUE CROSS/BLUE SHIELD | Source: Ambulatory Visit | Attending: Internal Medicine | Admitting: Internal Medicine

## 2017-06-21 ENCOUNTER — Encounter (HOSPITAL_COMMUNITY): Payer: BLUE CROSS/BLUE SHIELD

## 2017-06-21 DIAGNOSIS — Z86711 Personal history of pulmonary embolism: Secondary | ICD-10-CM

## 2017-06-21 DIAGNOSIS — I2510 Atherosclerotic heart disease of native coronary artery without angina pectoris: Secondary | ICD-10-CM | POA: Diagnosis not present

## 2017-06-21 DIAGNOSIS — I700 Atherosclerosis of aorta: Secondary | ICD-10-CM | POA: Insufficient documentation

## 2017-06-21 DIAGNOSIS — I82409 Acute embolism and thrombosis of unspecified deep veins of unspecified lower extremity: Secondary | ICD-10-CM | POA: Diagnosis not present

## 2017-06-21 MED ORDER — IOPAMIDOL (ISOVUE-370) INJECTION 76%
100.0000 mL | Freq: Once | INTRAVENOUS | Status: AC | PRN
  Administered 2017-06-21: 100 mL via INTRAVENOUS

## 2017-06-23 ENCOUNTER — Encounter (HOSPITAL_COMMUNITY)
Admission: RE | Admit: 2017-06-23 | Discharge: 2017-06-23 | Disposition: A | Discharge status: Home / Self Care | Payer: BLUE CROSS/BLUE SHIELD | Source: Ambulatory Visit | Attending: Cardiology | Admitting: Cardiology

## 2017-06-23 DIAGNOSIS — E785 Hyperlipidemia, unspecified: Secondary | ICD-10-CM | POA: Insufficient documentation

## 2017-06-23 DIAGNOSIS — Z79899 Other long term (current) drug therapy: Secondary | ICD-10-CM | POA: Diagnosis not present

## 2017-06-23 DIAGNOSIS — I214 Non-ST elevation (NSTEMI) myocardial infarction: Secondary | ICD-10-CM | POA: Diagnosis not present

## 2017-06-23 DIAGNOSIS — Z7901 Long term (current) use of anticoagulants: Secondary | ICD-10-CM | POA: Insufficient documentation

## 2017-06-23 DIAGNOSIS — Z7982 Long term (current) use of aspirin: Secondary | ICD-10-CM | POA: Insufficient documentation

## 2017-06-23 DIAGNOSIS — Z955 Presence of coronary angioplasty implant and graft: Secondary | ICD-10-CM | POA: Diagnosis not present

## 2017-06-23 DIAGNOSIS — Z86718 Personal history of other venous thrombosis and embolism: Secondary | ICD-10-CM | POA: Diagnosis not present

## 2017-06-24 ENCOUNTER — Other Ambulatory Visit: Payer: Self-pay

## 2017-06-24 ENCOUNTER — Inpatient Hospital Stay (HOSPITAL_COMMUNITY): Payer: BLUE CROSS/BLUE SHIELD | Attending: Internal Medicine | Admitting: Internal Medicine

## 2017-06-24 ENCOUNTER — Encounter (HOSPITAL_COMMUNITY): Payer: Self-pay | Admitting: Internal Medicine

## 2017-06-24 VITALS — BP 123/64 | HR 70 | Temp 97.8°F | Resp 18 | Ht 67.75 in | Wt 174.1 lb

## 2017-06-24 DIAGNOSIS — Z86711 Personal history of pulmonary embolism: Secondary | ICD-10-CM | POA: Insufficient documentation

## 2017-06-24 DIAGNOSIS — I700 Atherosclerosis of aorta: Secondary | ICD-10-CM

## 2017-06-24 DIAGNOSIS — Z7901 Long term (current) use of anticoagulants: Secondary | ICD-10-CM | POA: Diagnosis not present

## 2017-06-24 DIAGNOSIS — I82512 Chronic embolism and thrombosis of left femoral vein: Secondary | ICD-10-CM | POA: Diagnosis not present

## 2017-06-24 DIAGNOSIS — I82432 Acute embolism and thrombosis of left popliteal vein: Secondary | ICD-10-CM | POA: Diagnosis not present

## 2017-06-24 DIAGNOSIS — I82542 Chronic embolism and thrombosis of left tibial vein: Secondary | ICD-10-CM | POA: Insufficient documentation

## 2017-06-24 NOTE — Progress Notes (Signed)
Diagnosis No diagnosis found.  Staging Cancer Staging No matching staging information was found for the patient.  Assessment and Plan 1.  PE and DVT.  72 year old male who was diagnosed with Bilateral PE and Left LE DVT in 2009.  He has been maintained on Coumadin since that time.  He reports he has tolerated treatment without problems.  He was previously seen by Dr. Mariel Sleet and underwent hypercoagulable evaluation which was reportedly negative.  Review of labs done 02/09/2008 show lupus anticoagulant was negative, factor V Leiden was negative, homocystine was normal, beta-2 glycoprotein was negative.  Antithrombin III was normal, anticardiolipin antibodies were negative, prothrombin gene mutation was negative.  He reports he had a heart attack in January 2019  and his cardiologist and primary care physician were questioning why he remained on Coumadin.  He has been recommended for Plavix for a year.  He is not currently on aspirin.  Review of chart shows CT angio chest done 11/11/2007 and showed bilateral PEs.  Doppler was done 10/14/2013 and showed chronic DVT in the left femoral vein.  Right lower extremity was negative.  The patient has concerns about coming off Coumadin.  He reports he had previously been told by Dr. Mariel Sleet he needed lifetime anticoagulation.  Last CT of the abdomen and pelvis was done on 02/08/2008 and showed no evidence of malignancy in the abdomen or pelvis.   The patient was originally seen for consultation due to his history of PE and DVT.  Previously, I have discussed with him that imaging of the chest which was done in 2009 showed bilateral PEs.  Lower extremity Doppler done in 2015 showed chronic DVT in the left femoral vein.  He has undergone hypercoagulable evaluation which was negative.  I have discussed with him the usual recommendations for anticoagulation are for 6 months to a year of therapy.  Based on his prior imaging showing evidence of residual thrombosis he was  set up for repeat CT angio of the chest as well as bilateral lower extremity Doppler for follow-up evaluation.   CTA of chest done 06/21/2017 showed IMPRESSION: 1. No evidence of residual or recurrent pulmonary emboli. 2. Aortic Atherosclerosis (ICD10-I70.0) and coronary artery atherosclerosis     Bilateral LE doppler done 06/21/2017 showed  IMPRESSION: 1. Hypoechoic slightly expansile DVT within the left popliteal vein, new compared to the 09/2013 examination - in the absence of interval prior examinations, an acute on chronic process is not excluded and clinical correlation is advised. 2. Chronic occlusive DVT involving the left femoral and tibial veins as detailed above. 3. Chronic nonocclusive DVT involving the left deep femoral vein. 4. No evidence of acute or chronic DVT within the right lower extremity.  Today, I discussed with him Doppler of lower extremity done in April 2019 showed DVT in the left popliteal vein which was new compared to ultrasound evaluation in 2015.  This was likely considered to be acute on chronic.  He denies any discomfort in the lower extremity.  He was given the option of vascular referral for further evaluation but he does not desire vascular referral.  I have also talked with him about potential options of NOAC therapy such as Eliquis or Xarelto if he would like to come off Coumadin and these agents would not require INR monitoring.  He reports currently he would prefer to stay on Coumadin and will follow with his PCP for ongoing lab evaluation.  He is recommended for ongoing anticoagulation due to persistent DVT.  He will  return to clinic in 1 year for follow-up.  He should notify the office if he has any issues such as worsening shortness of breath or lower extremity pain prior to his next visit   2.  MI.  He reports this occurred in January 2019.  He is currently on Plavix and has been recommended for 1 year of therapy.  He should continue to follow-up with  cardiology and notify if any bleeding occurs due to being on dual anticoagulation.  I have informed him that evidence of atherosclerosis was also noted on CTA done April 2019.  3.  Family history of stroke.  He reports this occurred in his mother.  4.  Health maintenance.  He reports he has undergone colonoscopy in the past.    Interval History:  72 year old male who was diagnosed with PE and DVT in 2009.  He has been maintained on Coumadin since that time.  He reports he has tolerated treatment without problems.  He was previously seen by Dr. Mariel Sleet and underwent hypercoagulable evaluation which was reportedly negative.  Review of labs done 02/09/2008 show lupus anticoagulant was negative, factor V Leiden was negative, homocystine was normal, beta-2 glycoprotein was negative.  Antithrombin III was normal, anticardiolipin antibodies were negative, prothrombin gene mutation was negative.  He reports he had a heart attack in January 2019  and his cardiologist and primary care physician were questioning why he remained on Coumadin.  He has been recommended for Plavix for a year.  He is not currently on aspirin.  Review of chart shows last CT angio chest done 11/11/2007 and showed bilateral PEs.  Most recent Doppler was done 10/14/2013 and showed chronic DVT in the left femoral vein.  Right lower extremity was negative.  The patient had concerns about coming off Coumadin.  He reports he had previously been told by Dr. Mariel Sleet he needed lifetime anticoagulation.  Last CT of the abdomen and pelvis was done on 02/08/2008 and showed no evidence of malignancy in the abdomen or pelvis.   The patient was seen initially for consultation due to his history of PE and DVT.  Current Status: Patient is seen today for follow-up.  He is here today to go over scans and ultrasound.  Problem List Patient Active Problem List   Diagnosis Date Noted  . History of pulmonary embolism [Z86.711] 04/13/2017  . CAD- S/P PCI  [I25.10, Z98.61]   . NSTEMI (non-ST elevated myocardial infarction) (HCC) [I21.4] 04/09/2017  . Dyslipidemia, goal LDL below 70 [E78.5] 04/09/2017  . Overweight (BMI 25.0-29.9) [E66.3] 05/01/2015  . S/P left THA, AA [Z96.649] 04/30/2015  . Status post total replacement of right hip [Z96.641] 12/22/2013  . Elevated PSA [R97.20] 01/18/2013  . Erectile dysfunction [N52.9] 01/03/2013  . Long term (current) use of anticoagulants [Z79.01] 06/25/2012    Past Medical History Past Medical History:  Diagnosis Date  . Anticoagulated on Coumadin   . Avascular necrosis of hip (HCC)    LEFT  . DVT (deep venous thrombosis) (HCC) 2009  . Dyslipidemia, goal LDL below 70 04/09/2017  . Family history of anesthesia complication    BROTHER HAD MALIGNANT HYPOTHERMIA - PT HAS NOT HAD ANY PROBLEMS WITH ANESTHESIA    . History of kidney stones   . Malignant hyperthermia    PT'S BROTHER HAS HX MALIGNANT HYPERTHERMIA  . NSTEMI (non-ST elevated myocardial infarction) (HCC) 04/09/2017  . Pulmonary embolism (HCC) 2009  . Vertigo    NO RECENT PROBLEMS    Past Surgical History Past  Surgical History:  Procedure Laterality Date  . BACK SURGERY  2009   DISCECTOMY  . CERVICAL FUSION  2008  . CORONARY STENT INTERVENTION N/A 04/09/2017   Procedure: CORONARY STENT INTERVENTION;  Surgeon: Yvonne Kendall, MD;  Location: [facility];  Service: Cardiovascular;  Laterality: N/A;  . CORONARY STENT INTERVENTION N/A 04/12/2017   Procedure: CORONARY STENT INTERVENTION;  Surgeon: Yvonne Kendall, MD;  Location: [facility];  Service: Cardiovascular;  Laterality: N/A;  . INTRAVASCULAR ULTRASOUND/IVUS N/A 04/12/2017   Procedure: Intravascular Ultrasound/IVUS;  Surgeon: Yvonne Kendall, MD;  Location: [facility];  Service: Cardiovascular;  Laterality: N/A;  . LEFT HEART CATH AND CORONARY ANGIOGRAPHY N/A 04/09/2017   Procedure: LEFT HEART CATH AND CORONARY ANGIOGRAPHY;  Surgeon: Yvonne Kendall, MD;   Location: [facility];  Service: Cardiovascular;  Laterality: N/A;  . TOTAL HIP ARTHROPLASTY Right 12/22/2013   Procedure: RIGHT TOTAL HIP ARTHROPLASTY ANTERIOR APPROACH;  Surgeon: Shelda Pal, MD;  Location: [facility];  Service: Orthopedics;  Laterality: Right;  . TOTAL HIP ARTHROPLASTY Left 04/30/2015   Procedure: LEFT TOTAL HIP ARTHROPLASTY ANTERIOR APPROACH;  Surgeon: Durene Romans, MD;  Location: [facility];  Service: Orthopedics;  Laterality: Left;    Family History Family History  Problem Relation Age of Onset  . Breast cancer Mother   . Hypertension Mother   . Hyperlipidemia Mother   . Heart attack Mother 31  . Stroke Mother   . Hypertension Paternal Grandmother   . Diabetes Paternal Grandmother   . Cirrhosis Sister   . Cancer Brother      Social History  reports that he has never smoked. He has never used smokeless tobacco. He reports that he does not drink alcohol or use drugs.  Medications  Current Outpatient Medications:  .  acetaminophen (TYLENOL) 500 MG tablet, Take 2 tablets (1,000 mg total) by mouth every 8 (eight) hours., Disp: 30 tablet, Rfl: 0 .  atorvastatin (LIPITOR) 80 MG tablet, Take 1 tablet (80 mg total) by mouth daily at 6 PM., Disp: 90 tablet, Rfl: 3 .  carvedilol (COREG) 6.25 MG tablet, Take 1 tablet (6.25 mg total) by mouth 2 (two) times daily with a meal., Disp: 180 tablet, Rfl: 3 .  clopidogrel (PLAVIX) 75 MG tablet, Take 1 tablet (75 mg total) by mouth daily., Disp: 90 tablet, Rfl: 3 .  nitroGLYCERIN (NITROSTAT) 0.4 MG SL tablet, Place 1 tablet (0.4 mg total) under the tongue every 5 (five) minutes x 3 doses as needed for chest pain., Disp: 25 tablet, Rfl: 1 .  warfarin (COUMADIN) 5 MG tablet, TAKE 1 TABLET DAILY (Patient taking differently: TAKE 1 TABLET DAILY except on Tuesday taking 2.5mg ), Disp: 90 tablet, Rfl: 3  Allergies Lyrica [pregabalin]  Review of Systems Review of Systems - Oncology ROS as per HPI otherwise 12 point ROS is  negative.   Physical Exam  Vitals Wt Readings from Last 3 Encounters:  06/24/17 174 lb 1.6 oz (79 kg)  06/08/17 175 lb 4.3 oz (79.5 kg)  05/28/17 171 lb 9.6 oz (77.8 kg)   Temp Readings from Last 3 Encounters:  06/24/17 97.8 F (36.6 C) (Oral)  04/13/17 98.3 F (36.8 C) (Oral)  05/01/15 98.4 F (36.9 C) (Oral)   BP Readings from Last 3 Encounters:  06/24/17 123/64  06/08/17 128/72  05/28/17 134/78   Pulse Readings from Last 3 Encounters:  06/24/17 70  06/08/17 73  05/27/17 63   Constitutional: Well-developed, well-nourished, and in no distress.  HENT: Head: Normocephalic and atraumatic.  Mouth/Throat: No oropharyngeal exudate. Mucosa moist. Eyes: Pupils are equal, round, and reactive to light. Conjunctivae are normal. No scleral icterus.  Neck: Normal range of motion. Neck supple. No JVD present.  Cardiovascular: Normal rate, regular rhythm and normal heart sounds.  Exam reveals no gallop and no friction rub.   No murmur heard. Pulmonary/Chest: Effort normal and breath sounds normal. No respiratory distress. No wheezes.No rales.  Abdominal: Soft. Bowel sounds are normal. No distension. There is no tenderness. There is no guarding.  Musculoskeletal: No edema or tenderness.  Lymphadenopathy: No cervical, axillary or supraclavicular adenopathy.  Neurological: Alert and oriented to person, place, and time. No cranial nerve deficit.  Skin: Skin is warm and dry. No rash noted. No erythema. No pallor.  Psychiatric: Affect and judgment normal.   Labs No visits with results within 3 Day(s) from this visit.  Latest known visit with results is:  Anti-coag visit on 06/17/2017  Component Date Value Ref Range Status  . INR 06/17/2017 2.3   Final     Pathology No orders of the defined types were placed in this encounter.      Ahmed Prima MD

## 2017-06-25 ENCOUNTER — Encounter (HOSPITAL_COMMUNITY)
Admission: RE | Admit: 2017-06-25 | Discharge: 2017-06-25 | Disposition: A | Discharge status: Home / Self Care | Payer: BLUE CROSS/BLUE SHIELD | Source: Ambulatory Visit | Attending: Cardiology | Admitting: Cardiology

## 2017-06-25 DIAGNOSIS — Z86718 Personal history of other venous thrombosis and embolism: Secondary | ICD-10-CM | POA: Diagnosis not present

## 2017-06-25 DIAGNOSIS — Z7982 Long term (current) use of aspirin: Secondary | ICD-10-CM | POA: Diagnosis not present

## 2017-06-25 DIAGNOSIS — E785 Hyperlipidemia, unspecified: Secondary | ICD-10-CM | POA: Diagnosis not present

## 2017-06-25 DIAGNOSIS — Z7901 Long term (current) use of anticoagulants: Secondary | ICD-10-CM | POA: Diagnosis not present

## 2017-06-25 DIAGNOSIS — I214 Non-ST elevation (NSTEMI) myocardial infarction: Secondary | ICD-10-CM | POA: Diagnosis not present

## 2017-06-25 DIAGNOSIS — Z955 Presence of coronary angioplasty implant and graft: Secondary | ICD-10-CM

## 2017-06-25 DIAGNOSIS — Z79899 Other long term (current) drug therapy: Secondary | ICD-10-CM | POA: Diagnosis not present

## 2017-06-28 ENCOUNTER — Encounter (HOSPITAL_COMMUNITY)
Admission: RE | Admit: 2017-06-28 | Discharge: 2017-06-28 | Disposition: A | Discharge status: Home / Self Care | Payer: BLUE CROSS/BLUE SHIELD | Source: Ambulatory Visit | Attending: Cardiology | Admitting: Cardiology

## 2017-06-28 DIAGNOSIS — I214 Non-ST elevation (NSTEMI) myocardial infarction: Secondary | ICD-10-CM | POA: Diagnosis not present

## 2017-06-28 DIAGNOSIS — Z7982 Long term (current) use of aspirin: Secondary | ICD-10-CM | POA: Diagnosis not present

## 2017-06-28 DIAGNOSIS — E785 Hyperlipidemia, unspecified: Secondary | ICD-10-CM | POA: Diagnosis not present

## 2017-06-28 DIAGNOSIS — Z86718 Personal history of other venous thrombosis and embolism: Secondary | ICD-10-CM | POA: Diagnosis not present

## 2017-06-28 DIAGNOSIS — Z7901 Long term (current) use of anticoagulants: Secondary | ICD-10-CM | POA: Diagnosis not present

## 2017-06-28 DIAGNOSIS — Z955 Presence of coronary angioplasty implant and graft: Secondary | ICD-10-CM | POA: Diagnosis not present

## 2017-06-28 DIAGNOSIS — Z79899 Other long term (current) drug therapy: Secondary | ICD-10-CM | POA: Diagnosis not present

## 2017-06-30 ENCOUNTER — Encounter (HOSPITAL_COMMUNITY)
Admission: RE | Admit: 2017-06-30 | Discharge: 2017-06-30 | Disposition: A | Discharge status: Home / Self Care | Payer: BLUE CROSS/BLUE SHIELD | Source: Ambulatory Visit | Attending: Cardiology | Admitting: Cardiology

## 2017-06-30 DIAGNOSIS — Z7901 Long term (current) use of anticoagulants: Secondary | ICD-10-CM | POA: Diagnosis not present

## 2017-06-30 DIAGNOSIS — E785 Hyperlipidemia, unspecified: Secondary | ICD-10-CM | POA: Diagnosis not present

## 2017-06-30 DIAGNOSIS — I214 Non-ST elevation (NSTEMI) myocardial infarction: Secondary | ICD-10-CM | POA: Diagnosis not present

## 2017-06-30 DIAGNOSIS — Z955 Presence of coronary angioplasty implant and graft: Secondary | ICD-10-CM | POA: Diagnosis not present

## 2017-06-30 DIAGNOSIS — Z86718 Personal history of other venous thrombosis and embolism: Secondary | ICD-10-CM | POA: Diagnosis not present

## 2017-06-30 DIAGNOSIS — Z79899 Other long term (current) drug therapy: Secondary | ICD-10-CM | POA: Diagnosis not present

## 2017-06-30 DIAGNOSIS — Z7982 Long term (current) use of aspirin: Secondary | ICD-10-CM | POA: Diagnosis not present

## 2017-07-02 ENCOUNTER — Encounter (HOSPITAL_COMMUNITY)
Admission: RE | Admit: 2017-07-02 | Discharge: 2017-07-02 | Disposition: A | Discharge status: Home / Self Care | Payer: BLUE CROSS/BLUE SHIELD | Source: Ambulatory Visit | Attending: Cardiology | Admitting: Cardiology

## 2017-07-02 DIAGNOSIS — E785 Hyperlipidemia, unspecified: Secondary | ICD-10-CM | POA: Diagnosis not present

## 2017-07-02 DIAGNOSIS — Z7901 Long term (current) use of anticoagulants: Secondary | ICD-10-CM | POA: Diagnosis not present

## 2017-07-02 DIAGNOSIS — Z7982 Long term (current) use of aspirin: Secondary | ICD-10-CM | POA: Diagnosis not present

## 2017-07-02 DIAGNOSIS — Z955 Presence of coronary angioplasty implant and graft: Secondary | ICD-10-CM | POA: Diagnosis not present

## 2017-07-02 DIAGNOSIS — Z86718 Personal history of other venous thrombosis and embolism: Secondary | ICD-10-CM | POA: Diagnosis not present

## 2017-07-02 DIAGNOSIS — Z79899 Other long term (current) drug therapy: Secondary | ICD-10-CM | POA: Diagnosis not present

## 2017-07-02 DIAGNOSIS — I214 Non-ST elevation (NSTEMI) myocardial infarction: Secondary | ICD-10-CM | POA: Diagnosis not present

## 2017-07-05 ENCOUNTER — Encounter (HOSPITAL_COMMUNITY)
Admission: RE | Admit: 2017-07-05 | Discharge: 2017-07-05 | Disposition: A | Discharge status: Home / Self Care | Payer: BLUE CROSS/BLUE SHIELD | Source: Ambulatory Visit | Attending: Cardiology | Admitting: Cardiology

## 2017-07-05 DIAGNOSIS — Z955 Presence of coronary angioplasty implant and graft: Secondary | ICD-10-CM

## 2017-07-05 DIAGNOSIS — E785 Hyperlipidemia, unspecified: Secondary | ICD-10-CM | POA: Diagnosis not present

## 2017-07-05 DIAGNOSIS — Z79899 Other long term (current) drug therapy: Secondary | ICD-10-CM | POA: Diagnosis not present

## 2017-07-05 DIAGNOSIS — Z7901 Long term (current) use of anticoagulants: Secondary | ICD-10-CM | POA: Diagnosis not present

## 2017-07-05 DIAGNOSIS — Z86718 Personal history of other venous thrombosis and embolism: Secondary | ICD-10-CM | POA: Diagnosis not present

## 2017-07-05 DIAGNOSIS — I214 Non-ST elevation (NSTEMI) myocardial infarction: Secondary | ICD-10-CM | POA: Diagnosis not present

## 2017-07-05 DIAGNOSIS — Z7982 Long term (current) use of aspirin: Secondary | ICD-10-CM | POA: Diagnosis not present

## 2017-07-05 DIAGNOSIS — I213 ST elevation (STEMI) myocardial infarction of unspecified site: Secondary | ICD-10-CM

## 2017-07-05 NOTE — Progress Notes (Signed)
I have reviewed a Home Exercise Prescription with [name]. [name] is currently exercising at home.  The patient was advised to continue exercising 2 days a week days a week for 30-45 minutes. Pt is walking and has stationary bike at home. [name] and I discussed how to progress their exercise prescription. The patient stated that they understand the exercise prescription.  We reviewed exercise guidelines, target heart rate during exercise, weather, endpoints for exercise, and goals. Patient is encouraged to come to me with any questions. I will continue to follow up with the patient to assist them with progression and safety.    York Cerise MS, ACSM CEP 9:57 AM 07/05/2017

## 2017-07-07 ENCOUNTER — Encounter (HOSPITAL_COMMUNITY): Payer: Self-pay

## 2017-07-07 ENCOUNTER — Encounter (HOSPITAL_COMMUNITY)
Admission: RE | Admit: 2017-07-07 | Discharge: 2017-07-07 | Disposition: A | Discharge status: Home / Self Care | Payer: BLUE CROSS/BLUE SHIELD | Source: Ambulatory Visit | Attending: Cardiology | Admitting: Cardiology

## 2017-07-07 DIAGNOSIS — Z7901 Long term (current) use of anticoagulants: Secondary | ICD-10-CM | POA: Diagnosis not present

## 2017-07-07 DIAGNOSIS — I214 Non-ST elevation (NSTEMI) myocardial infarction: Secondary | ICD-10-CM | POA: Diagnosis not present

## 2017-07-07 DIAGNOSIS — Z79899 Other long term (current) drug therapy: Secondary | ICD-10-CM | POA: Diagnosis not present

## 2017-07-07 DIAGNOSIS — Z955 Presence of coronary angioplasty implant and graft: Secondary | ICD-10-CM

## 2017-07-07 DIAGNOSIS — Z86718 Personal history of other venous thrombosis and embolism: Secondary | ICD-10-CM | POA: Diagnosis not present

## 2017-07-07 DIAGNOSIS — E785 Hyperlipidemia, unspecified: Secondary | ICD-10-CM | POA: Diagnosis not present

## 2017-07-07 DIAGNOSIS — Z7982 Long term (current) use of aspirin: Secondary | ICD-10-CM | POA: Diagnosis not present

## 2017-07-08 NOTE — Progress Notes (Signed)
Cardiac Individual Treatment Plan  Patient Details  Name: [name]. MRN: [id number] Date of Birth: 1945/07/05 Referring Provider:     CARDIAC REHAB PHASE II ORIENTATION from 06/08/2017 in [facility] CARDIAC REHAB  Referring Provider  Donato Schultz MD      Initial Encounter Date:    CARDIAC REHAB PHASE II ORIENTATION from 06/08/2017 in [facility] CARDIAC REHAB  Date  06/08/17  Referring Provider  Donato Schultz MD      Visit Diagnosis: S/P drug eluting coronary stent placement  NSTEMI (non-ST elevated myocardial infarction) (HCC)  Patient's Home Medications on Admission:  Current Outpatient Medications:  .  acetaminophen (TYLENOL) 500 MG tablet, Take 2 tablets (1,000 mg total) by mouth every 8 (eight) hours., Disp: 30 tablet, Rfl: 0 .  atorvastatin (LIPITOR) 80 MG tablet, Take 1 tablet (80 mg total) by mouth daily at 6 PM., Disp: 90 tablet, Rfl: 3 .  carvedilol (COREG) 6.25 MG tablet, Take 1 tablet (6.25 mg total) by mouth 2 (two) times daily with a meal., Disp: 180 tablet, Rfl: 3 .  clopidogrel (PLAVIX) 75 MG tablet, Take 1 tablet (75 mg total) by mouth daily., Disp: 90 tablet, Rfl: 3 .  nitroGLYCERIN (NITROSTAT) 0.4 MG SL tablet, Place 1 tablet (0.4 mg total) under the tongue every 5 (five) minutes x 3 doses as needed for chest pain., Disp: 25 tablet, Rfl: 1 .  warfarin (COUMADIN) 5 MG tablet, TAKE 1 TABLET DAILY (Patient taking differently: TAKE 1 TABLET DAILY except on Tuesday taking 2.5mg ), Disp: 90 tablet, Rfl: 3  Past Medical History: Past Medical History:  Diagnosis Date  . Anticoagulated on Coumadin   . Avascular necrosis of hip (HCC)    LEFT  . DVT (deep venous thrombosis) (HCC) 2009  . Dyslipidemia, goal LDL below 70 04/09/2017  . Family history of anesthesia complication    BROTHER HAD MALIGNANT HYPOTHERMIA - PT HAS NOT HAD ANY PROBLEMS WITH ANESTHESIA    . History of kidney stones   . Malignant hyperthermia    PT'S BROTHER  HAS HX MALIGNANT HYPERTHERMIA  . NSTEMI (non-ST elevated myocardial infarction) (HCC) 04/09/2017  . Pulmonary embolism (HCC) 2009  . Vertigo    NO RECENT PROBLEMS    Tobacco Use: Social History   Tobacco Use  Smoking Status Never Smoker  Smokeless Tobacco Never Used    Labs: Recent Review Flowsheet Data    Labs for ITP Cardiac and Pulmonary Rehab Latest Ref Rng & Units 03/02/2015 03/09/2016 04/09/2017 04/10/2017 05/20/2017   Cholestrol 100 - 199 mg/dL - 478 - 295 62(Z)   LDLCALC 0 - 99 mg/dL - 308(M) - 64 40   HDL >39 mg/dL - 57(Q) - 46(N) 62(X)   Trlycerides 0 - 149 mg/dL - 528(U) - 87 80   Hemoglobin A1c 4.8 - 5.6 % - - 5.6 - -   TCO2 0 - 100 mmol/L 24 - - - -      Capillary Blood Glucose: No results found for: GLUCAP   Exercise Target Goals:    Exercise Program Goal: Individual exercise prescription set using results from initial 6 min walk test and THRR while considering  patient's activity barriers and safety.   Exercise Prescription Goal: Initial exercise prescription builds to 30-45 minutes a day of aerobic activity, 2-3 days per week.  Home exercise guidelines will be given to patient during program as part of exercise prescription that the participant will acknowledge.  Activity Barriers & Risk Stratification: Activity Barriers &  Cardiac Risk Stratification - 06/08/17 1036      Activity Barriers & Cardiac Risk Stratification   Activity Barriers  Other (comment);Left Hip Replacement;Right Hip Replacement    Comments  Herniated disc. Rupture disc.    Cardiac Risk Stratification  High       6 Minute Walk: 6 Minute Walk    Row Name 06/08/17 1035         6 Minute Walk   Phase  Initial     Distance  1839 feet     Walk Time  6 minutes     # of Rest Breaks  0     MPH  3.5     METS  3.9     RPE  8     VO2 Peak  13.88     Symptoms  No     Resting HR  73 bpm     Resting BP  128/72     Resting Oxygen Saturation   98 %     Exercise Oxygen Saturation   during 6 min walk  99 %     Max Ex. HR  98 bpm     Max Ex. BP  144/70     2 Minute Post BP  122/70        Oxygen Initial Assessment:   Oxygen Re-Evaluation:   Oxygen Discharge (Final Oxygen Re-Evaluation):   Initial Exercise Prescription: Initial Exercise Prescription - 06/08/17 1100      Date of Initial Exercise RX and Referring Provider   Date  06/08/17    Referring Provider  Donato Schultz MD      Treadmill   MPH  3    Grade  1    Minutes  10    METs  3.71      Bike   Level  0.8    Minutes  10    METs  2.89      NuStep   Level  3    SPM  85    Minutes  10    METs  2.5      Prescription Details   Frequency (times per week)  3    Duration  Progress to 30 minutes of continuous aerobic without signs/symptoms of physical distress      Intensity   THRR 40-80% of Max Heartrate  60-120    Ratings of Perceived Exertion  11-13    Perceived Dyspnea  0-4      Progression   Progression  Continue to progress workloads to maintain intensity without signs/symptoms of physical distress.      Resistance Training   Training Prescription  Yes    Weight  4lbs    Reps  10-15       Perform Capillary Blood Glucose checks as needed.  Exercise Prescription Changes: Exercise Prescription Changes    Row Name 06/14/17 1000 06/23/17 1605 07/05/17 1000         Response to Exercise   Blood Pressure (Admit)  130/64  124/70  128/62     Blood Pressure (Exercise)  170/88  150/70  152/82     Blood Pressure (Exit)  138/72  110/64  118/70     Heart Rate (Admit)  78 bpm  73 bpm  70 bpm     Heart Rate (Exercise)  117 bpm  122 bpm  129 bpm     Heart Rate (Exit)  80 bpm  76 bpm  79 bpm     Rating of Perceived  Exertion (Exercise)  11  11  12      Perceived Dyspnea (Exercise)  0  0  0     Symptoms  None  None  None     Comments  Pt oriented to exercise equipment  -  -     Duration  Progress to 30 minutes of  aerobic without signs/symptoms of physical distress  Continue with 30 min of  aerobic exercise without signs/symptoms of physical distress.  Continue with 30 min of aerobic exercise without signs/symptoms of physical distress.     Intensity  THRR New  THRR unchanged  THRR unchanged       Progression   Progression  Continue to progress workloads to maintain intensity without signs/symptoms of physical distress.  Continue to progress workloads to maintain intensity without signs/symptoms of physical distress.  Continue to progress workloads to maintain intensity without signs/symptoms of physical distress.     Average METs  2.9  4.01  4.6       Resistance Training   Training Prescription  Yes  No  Yes     Weight  4lbs  -  5lbs     Reps  10-15  -  10-15     Time  10 Minutes  -  10 Minutes       Interval Training   Interval Training  -  No  No       Treadmill   MPH  3  3  3      Grade  1  1  2      Minutes  10  10  10      METs  3.71  3.71  4.12       Bike   Level  0.8  1.5  1.5     Minutes  10  10  10      METs  2.9  4.62  4.69       NuStep   Level  3  4  5      SPM  85  85  85     Minutes  10  10  10      METs  3.1  3.7  5       Home Exercise Plan   Plans to continue exercise at  -  -  Home (comment) Walking & Stationary Bike     Frequency  -  -  Add 3 additional days to program exercise sessions.     Initial Home Exercises Provided  -  -  07/05/17        Exercise Comments: Exercise Comments    Row Name 06/14/17 1014 07/05/17 1000 07/05/17 1004       Exercise Comments  Pt oriented to exercise equipment. Pt off to a great start with exericse. Will continue to monitor and progress pt's exercise prescription.   Reviewed Home Exercise with pt. Pt is exercising 2 addtional days at home. 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 Pt is exercising 2 addtional days at home. 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 Pt is responding well to exericse workloads. Will continue to monitor and  progress pt as tolerated.         Exercise Goals and Review: Exercise Goals    Row Name 06/08/17 1121             Exercise Goals   Increase Physical Activity  Yes  Intervention  Provide advice, education, support and counseling about physical activity/exercise needs.;Develop an individualized exercise prescription for aerobic and resistive training based on initial evaluation findings, risk stratification, comorbidities and participant's personal goals.       Expected Outcomes  Short Term: Attend rehab on a regular basis to increase amount of physical activity.;Long Term: Exercising regularly at least 3-5 days a week.;Long Term: Add in home exercise to make exercise part of routine and to increase amount of physical activity.       Increase Strength and Stamina  Yes       Intervention  Provide advice, education, support and counseling about physical activity/exercise needs.;Develop an individualized exercise prescription for aerobic and resistive training based on initial evaluation findings, risk stratification, comorbidities and participant's personal goals.       Expected Outcomes  Short Term: Increase workloads from initial exercise prescription for resistance, speed, and METs.;Short Term: Perform resistance training exercises routinely during rehab and add in resistance training at home;Long Term: Improve cardiorespiratory fitness, muscular endurance and strength as measured by increased METs and functional capacity ( )       Able to understand and use rate of perceived exertion (RPE) scale  Yes       Intervention  Provide education and explanation on how to use RPE scale       Expected Outcomes  Short Term: Able to use RPE daily in rehab to express subjective intensity level;Long Term:  Able to use RPE to guide intensity level when exercising independently       Knowledge and understanding of Target Heart Rate Range (THRR)  Yes       Intervention  Provide education and  explanation of THRR including how the numbers were predicted and where they are located for reference       Expected Outcomes  Short Term: Able to state/look up THRR;Long Term: Able to use THRR to govern intensity when exercising independently;Short Term: Able to use daily as guideline for intensity in rehab       Able to check pulse independently  Yes       Intervention  Provide education and demonstration on how to check pulse in carotid and radial arteries.;Review the importance of being able to check your own pulse for safety during independent exercise       Expected Outcomes  Short Term: Able to explain why pulse checking is important during independent exercise;Long Term: Able to check pulse independently and accurately       Understanding of Exercise Prescription  Yes       Intervention  Provide education, explanation, and written materials on patient's individual exercise prescription       Expected Outcomes  Short Term: Able to explain program exercise prescription;Long Term: Able to explain home exercise prescription to exercise independently          Exercise Goals Re-Evaluation : Exercise Goals Re-Evaluation    Row Name 07/05/17 1004             Exercise Goal Re-Evaluation   Exercise Goals Review  Increase Physical Activity;Increase Strength and Stamina;Able to understand and use rate of perceived exertion (RPE) scale;Knowledge and understanding of Target Heart Rate Range (THRR);Able to check pulse independently;Understanding of Exercise Prescription       Comments  Reviewed HEP with pt. Also reviewed THRR, RPE Scale, weather conditions, NTG use, endpoints of exercise, warmup and cool down. Pt is responding well to his exercise prescription. Pt is now exercising at a level  5 on Nustep and 1.5 on Bike.        Expected Outcomes  Pt will continue to increase cardiovascular and muscular strength. Pt will continue to exercise 2 additonal days for 30 minutes. Will continue to progress pt  as tolerated.            Discharge Exercise Prescription (Final Exercise Prescription Changes): Exercise Prescription Changes - 07/05/17 1000      Response to Exercise   Blood Pressure (Admit)  128/62    Blood Pressure (Exercise)  152/82    Blood Pressure (Exit)  118/70    Heart Rate (Admit)  70 bpm    Heart Rate (Exercise)  129 bpm    Heart Rate (Exit)  79 bpm    Rating of Perceived Exertion (Exercise)  12    Perceived Dyspnea (Exercise)  0    Symptoms  None    Duration  Continue with 30 min of aerobic exercise without signs/symptoms of physical distress.    Intensity  THRR unchanged      Progression   Progression  Continue to progress workloads to maintain intensity without signs/symptoms of physical distress.    Average METs  4.6      Resistance Training   Training Prescription  Yes    Weight  5lbs    Reps  10-15    Time  10 Minutes      Interval Training   Interval Training  No      Treadmill   MPH  3    Grade  2    Minutes  10    METs  4.12      Bike   Level  1.5    Minutes  10    METs  4.69      NuStep   Level  5    SPM  85    Minutes  10    METs  5      Home Exercise Plan   Plans to continue exercise at  Home (comment) Walking & Stationary Bike    Frequency  Add 3 additional days to program exercise sessions.    Initial Home Exercises Provided  07/05/17       Nutrition:  Target Goals: Understanding of nutrition guidelines, daily intake of sodium 1500mg , cholesterol 200mg , calories 30% from fat and 7% or less from saturated fats, daily to have 5 or more servings of fruits and vegetables.  Biometrics: Pre Biometrics - 06/08/17 1122      Pre Biometrics   Height  5' 7.75" (1.721 m)    Weight  175 lb 4.3 oz (79.5 kg)    Waist Circumference  36.5 inches    Hip Circumference  39 inches    Waist to Hip Ratio  0.94 %    BMI (Calculated)  26.84    Triceps Skinfold  12 mm    % Body Fat  24.8 %    Grip Strength  41.5 kg    Flexibility  8 in     Single Leg Stand  11.88 seconds        Nutrition Therapy Plan and Nutrition Goals: Nutrition Therapy & Goals - 06/08/17 0921      Nutrition Therapy   Diet  Heart Healthy    Drug/Food Interactions  Coumadin/Vit K      Personal Nutrition Goals   Nutrition Goal  Pt to describe the benefit of including fruits, vegetables, whole grains, and low-fat dairy products in a heart healthy meal plan.  Intervention Plan   Intervention  Prescribe, educate and counsel regarding individualized specific dietary modifications aiming towards targeted core components such as weight, hypertension, lipid management, diabetes, heart failure and other comorbidities.    Expected Outcomes  Short Term Goal: Understand basic principles of dietary content, such as calories, fat, sodium, cholesterol and nutrients.;Long Term Goal: Adherence to prescribed nutrition plan.       Nutrition Assessments: Nutrition Assessments - 06/08/17 0921      MEDFICTS Scores   Pre Score  24       Nutrition Goals Re-Evaluation:   Nutrition Goals Re-Evaluation:   Nutrition Goals Discharge (Final Nutrition Goals Re-Evaluation):   Psychosocial: Target Goals: Acknowledge presence or absence of significant depression and/or stress, maximize coping skills, provide positive support system. Participant is able to verbalize types and ability to use techniques and skills needed for reducing stress and depression.  Initial Review & Psychosocial Screening: Initial Psych Review & Screening - 06/08/17 1337      Initial Review   Current issues with  None Identified      Family Dynamics   Good Support System?  Yes    Comments  Pt has support from wife       Barriers   Psychosocial barriers to participate in program  There are no identifiable barriers or psychosocial needs.      Screening Interventions   Interventions  Encouraged to exercise    Expected Outcomes  Short Term goal: Identification and review with participant of  any Quality of Life or Depression concerns found by scoring the questionnaire.;Long Term goal: The participant improves quality of Life and PHQ9 Scores as seen by post scores and/or verbalization of changes       Quality of Life Scores: Quality of Life - 06/08/17 0748      Quality of Life Scores   Health/Function Pre  29.6 %    Socioeconomic Pre  30 %    Psych/Spiritual Pre  30 %    Family Pre  30 %    GLOBAL Pre  29.83 %      Scores of 19 and below usually indicate a poorer quality of life in these areas.  A difference of  2-3 points is a clinically meaningful difference.  A difference of 2-3 points in the total score of the Quality of Life Index has been associated with significant improvement in overall quality of life, self-image, physical symptoms, and general health in studies assessing change in quality of life.  PHQ-9: Recent Review Flowsheet Data    Depression screen [facility] 2/9 06/14/2017   Decreased Interest 0   Down, Depressed, Hopeless 0   PHQ - 2 Score 0     Interpretation of Total Score  Total Score Depression Severity:  1-4 = Minimal depression, 5-9 = Mild depression, 10-14 = Moderate depression, 15-19 = Moderately severe depression, 20-27 = Severe depression   Psychosocial Evaluation and Intervention: Psychosocial Evaluation - 06/14/17 0759      Psychosocial Evaluation & Interventions   Interventions  Encouraged to exercise with the program and follow exercise prescription    Comments  No pyschosocial needs identified. No interventions needed. Pt enjoys hunting, his profession as Investment banker, corporate, and babysitting his grandchildren.    Expected Outcomes  Pt will exhibit positive outlook with good coping skills.     Continue Psychosocial Services   No Follow up required       Psychosocial Re-Evaluation: Psychosocial Re-Evaluation    Row Name 07/07/17 1109  Psychosocial Re-Evaluation   Current issues with  None Identified       Comments  No  psychosocial needs identified. No interventions necessary.        Expected Outcomes  Pt will continue to exhibit positive outlook with good coping skills.        Interventions  Encouraged to attend Cardiac Rehabilitation for the exercise       Continue Psychosocial Services   No Follow up required          Psychosocial Discharge (Final Psychosocial Re-Evaluation): Psychosocial Re-Evaluation - 07/07/17 1109      Psychosocial Re-Evaluation   Current issues with  None Identified    Comments  No psychosocial needs identified. No interventions necessary.     Expected Outcomes  Pt will continue to exhibit positive outlook with good coping skills.     Interventions  Encouraged to attend Cardiac Rehabilitation for the exercise    Continue Psychosocial Services   No Follow up required       Vocational Rehabilitation: Provide vocational rehab assistance to qualifying candidates.   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       Education: Education Goals: Education classes will be provided on a weekly basis, covering required topics. Participant will state understanding/return demonstration of topics presented.  Learning Barriers/Preferences: Learning Barriers/Preferences - 06/08/17 1034      Learning Barriers/Preferences   Learning Barriers  None    Learning Preferences  Skilled Demonstration       Education Topics: Count Your Pulse:  -Group instruction provided by verbal instruction, demonstration, patient participation and written materials to support subject.  Instructors address importance of being able to find your pulse and how to count your pulse when at home without a heart monitor.  Patients get hands on experience counting their pulse with staff help and  individually.   Heart Attack, Angina, and Risk Factor Modification:  -Group instruction provided by verbal instruction, video, and written materials to support subject.  Instructors address signs and symptoms of angina and heart attacks.    Also discuss risk factors for heart disease and how to make changes to improve heart health risk factors.   Functional Fitness:  -Group instruction provided by verbal instruction, demonstration, patient participation, and written materials to support subject.  Instructors address safety measures for doing things around the house.  Discuss how to get up and down off the floor, how to pick things up properly, how to safely get out of a chair without assistance, and balance training.   Meditation and Mindfulness:  -Group instruction provided by verbal instruction, patient participation, and written materials to support subject.  Instructor addresses importance of mindfulness and meditation practice to help reduce stress and improve awareness.  Instructor also leads participants through a meditation exercise.    Stretching for Flexibility and Mobility:  -Group instruction provided by verbal instruction, patient participation, and written materials to support subject.  Instructors lead participants through series of stretches that are designed to increase flexibility thus improving mobility.  These stretches are additional exercise for major muscle groups that are typically performed during regular warm up and cool down.   Hands Only CPR:  -Group verbal, video, and participation provides a basic overview of AHA guidelines for community CPR. Role-play of emergencies allow participants the  opportunity to practice calling for help and chest compression technique with discussion of AED use.   Hypertension: -Group verbal and written instruction that provides a basic overview of hypertension including the most recent diagnostic guidelines, risk factor reduction with  self-care instructions and medication management.    Nutrition I class: Heart Healthy Eating:  -Group instruction provided by PowerPoint slides, verbal discussion, and written materials to support subject matter. The instructor gives an explanation and review of the Therapeutic Lifestyle Changes diet recommendations, which includes a discussion on lipid goals, dietary fat, sodium, fiber, plant stanol/sterol esters, sugar, and the components of a well-balanced, healthy diet.   CARDIAC REHAB PHASE II ORIENTATION from 06/08/2017 in [facility] CARDIAC REHAB  Date  06/08/17  Educator  RD      Nutrition II class: Lifestyle Skills:  -Group instruction provided by PowerPoint slides, verbal discussion, and written materials to support subject matter. The instructor gives an explanation and review of label reading, grocery shopping for heart health, heart healthy recipe modifications, and ways to make healthier choices when eating out.   CARDIAC REHAB PHASE II ORIENTATION from 06/08/2017 in [facility] CARDIAC REHAB  Date  06/08/17  Educator  RD      Diabetes Question & Answer:  -Group instruction provided by PowerPoint slides, verbal discussion, and written materials to support subject matter. The instructor gives an explanation and review of diabetes co-morbidities, pre- and post-prandial blood glucose goals, pre-exercise blood glucose goals, signs, symptoms, and treatment of hypoglycemia and hyperglycemia, and foot care basics.   Diabetes Blitz:  -Group instruction provided by PowerPoint slides, verbal discussion, and written materials to support subject matter. The instructor gives an explanation and review of the physiology behind type 1 and type 2 diabetes, diabetes medications and rational behind using different medications, pre- and post-prandial blood glucose recommendations and Hemoglobin A1c goals, diabetes diet, and exercise including blood glucose  guidelines for exercising safely.    Portion Distortion:  -Group instruction provided by PowerPoint slides, verbal discussion, written materials, and food models to support subject matter. The instructor gives an explanation of serving size versus portion size, changes in portions sizes over the last 20 years, and what consists of a serving from each food group.   Stress Management:  -Group instruction provided by verbal instruction, video, and written materials to support subject matter.  Instructors review role of stress in heart disease and how to cope with stress positively.     Exercising on Your Own:  -Group instruction provided by verbal instruction, power point, and written materials to support subject.  Instructors discuss benefits of exercise, components of exercise, frequency and intensity of exercise, and end points for exercise.  Also discuss use of nitroglycerin and activating EMS.  Review options of places to exercise outside of rehab.  Review guidelines for sex with heart disease.   Cardiac Drugs I:  -Group instruction provided by verbal instruction and written materials to support subject.  Instructor reviews cardiac drug classes: antiplatelets, anticoagulants, beta blockers, and statins.  Instructor discusses reasons, side effects, and lifestyle considerations for each drug class.   Cardiac Drugs II:  -Group instruction provided by verbal instruction and written materials to support subject.  Instructor reviews cardiac drug classes: angiotensin converting enzyme inhibitors (ACE-I), angiotensin II receptor blockers (ARBs), nitrates, and calcium channel blockers.  Instructor discusses reasons, side effects, and lifestyle considerations for each drug class.   Anatomy and Physiology of the Circulatory System:  Group verbal and written  instruction and models provide basic cardiac anatomy and physiology, with the coronary electrical and arterial systems. Review of: AMI, Angina,  Valve disease, Heart Failure, Peripheral Artery Disease, Cardiac Arrhythmia, Pacemakers, and the ICD.   Other Education:  -Group or individual verbal, written, or video instructions that support the educational goals of the cardiac rehab program.   Holiday Eating Survival Tips:  -Group instruction provided by PowerPoint slides, verbal discussion, and written materials to support subject matter. The instructor gives patients tips, tricks, and techniques to help them not only survive but enjoy the holidays despite the onslaught of food that accompanies the holidays.   Knowledge Questionnaire Score: Knowledge Questionnaire Score - 06/08/17 0741      Knowledge Questionnaire Score   Pre Score  20/24       Core Components/Risk Factors/Patient Goals at Admission: Personal Goals and Risk Factors at Admission - 06/08/17 1112      Core Components/Risk Factors/Patient Goals on Admission   Lipids  Yes    Intervention  Provide education and support for participant on nutrition & aerobic/resistive exercise along with prescribed medications to achieve LDL 70mg , HDL >40mg .    Expected Outcomes  Short Term: Participant states understanding of desired cholesterol values and is compliant with medications prescribed. Participant is following exercise prescription and nutrition guidelines.;Long Term: Cholesterol controlled with medications as prescribed, with individualized exercise RX and with personalized nutrition plan. Value goals: LDL < 70mg , HDL > 40 mg.       Core Components/Risk Factors/Patient Goals Review:  Goals and Risk Factor Review    Row Name 06/14/17 0758 07/07/17 1106           Core Components/Risk Factors/Patient Goals Review   Personal Goals Review  Lipids  Lipids      Review  Pt with CAD RF and is willing to participate in CR exercises. 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   Pt with CAD RF and is eager to participate in CR exercises. Pt has  great attendance thus far in the program.       Expected Outcomes  Pt will participate in CR exercise, nutrition, and lifestyle modification opportunities.   Pt will participate in CR exercise, nutrition, and lifestyle modification opportunities.          Core Components/Risk Factors/Patient Goals at Discharge (Final Review):  Goals and Risk Factor Review - 07/07/17 1106      Core Components/Risk Factors/Patient Goals Review   Personal Goals Review  Lipids    Review  Pt with CAD RF and is eager to participate in CR exercises. Pt has great attendance thus far in the program.     Expected Outcomes  Pt will participate in CR exercise, nutrition, and lifestyle modification opportunities.        ITP Comments: ITP Comments    Row Name 06/08/17 0739 07/07/17 1105         ITP Comments  Medical Director- Dr. Armanda Magic, MD  30 Day ITP review.  Pt is off to a great start in the CR program.  He demonstrates eagerness to attend the program.          Comments: See ITP Comments.

## 2017-07-09 ENCOUNTER — Encounter (HOSPITAL_COMMUNITY)
Admission: RE | Admit: 2017-07-09 | Discharge: 2017-07-09 | Disposition: A | Discharge status: Home / Self Care | Payer: BLUE CROSS/BLUE SHIELD | Source: Ambulatory Visit | Attending: Cardiology | Admitting: Cardiology

## 2017-07-09 DIAGNOSIS — Z79899 Other long term (current) drug therapy: Secondary | ICD-10-CM | POA: Diagnosis not present

## 2017-07-09 DIAGNOSIS — Z86718 Personal history of other venous thrombosis and embolism: Secondary | ICD-10-CM | POA: Diagnosis not present

## 2017-07-09 DIAGNOSIS — I214 Non-ST elevation (NSTEMI) myocardial infarction: Secondary | ICD-10-CM | POA: Diagnosis not present

## 2017-07-09 DIAGNOSIS — Z7901 Long term (current) use of anticoagulants: Secondary | ICD-10-CM | POA: Diagnosis not present

## 2017-07-09 DIAGNOSIS — Z7982 Long term (current) use of aspirin: Secondary | ICD-10-CM | POA: Diagnosis not present

## 2017-07-09 DIAGNOSIS — E785 Hyperlipidemia, unspecified: Secondary | ICD-10-CM | POA: Diagnosis not present

## 2017-07-09 DIAGNOSIS — Z955 Presence of coronary angioplasty implant and graft: Secondary | ICD-10-CM | POA: Diagnosis not present

## 2017-07-12 ENCOUNTER — Encounter (HOSPITAL_COMMUNITY)
Admission: RE | Admit: 2017-07-12 | Discharge: 2017-07-12 | Disposition: A | Discharge status: Home / Self Care | Payer: BLUE CROSS/BLUE SHIELD | Source: Ambulatory Visit | Attending: Cardiology | Admitting: Cardiology

## 2017-07-12 DIAGNOSIS — Z7901 Long term (current) use of anticoagulants: Secondary | ICD-10-CM | POA: Diagnosis not present

## 2017-07-12 DIAGNOSIS — Z7982 Long term (current) use of aspirin: Secondary | ICD-10-CM | POA: Diagnosis not present

## 2017-07-12 DIAGNOSIS — Z955 Presence of coronary angioplasty implant and graft: Secondary | ICD-10-CM | POA: Diagnosis not present

## 2017-07-12 DIAGNOSIS — E785 Hyperlipidemia, unspecified: Secondary | ICD-10-CM | POA: Diagnosis not present

## 2017-07-12 DIAGNOSIS — Z86718 Personal history of other venous thrombosis and embolism: Secondary | ICD-10-CM | POA: Diagnosis not present

## 2017-07-12 DIAGNOSIS — I214 Non-ST elevation (NSTEMI) myocardial infarction: Secondary | ICD-10-CM

## 2017-07-12 DIAGNOSIS — Z79899 Other long term (current) drug therapy: Secondary | ICD-10-CM | POA: Diagnosis not present

## 2017-07-14 ENCOUNTER — Encounter (HOSPITAL_COMMUNITY)
Admission: RE | Admit: 2017-07-14 | Discharge: 2017-07-14 | Disposition: A | Discharge status: Home / Self Care | Payer: BLUE CROSS/BLUE SHIELD | Source: Ambulatory Visit | Attending: Cardiology | Admitting: Cardiology

## 2017-07-14 DIAGNOSIS — E785 Hyperlipidemia, unspecified: Secondary | ICD-10-CM | POA: Diagnosis not present

## 2017-07-14 DIAGNOSIS — Z955 Presence of coronary angioplasty implant and graft: Secondary | ICD-10-CM | POA: Diagnosis not present

## 2017-07-14 DIAGNOSIS — I214 Non-ST elevation (NSTEMI) myocardial infarction: Secondary | ICD-10-CM | POA: Diagnosis not present

## 2017-07-14 DIAGNOSIS — Z86718 Personal history of other venous thrombosis and embolism: Secondary | ICD-10-CM | POA: Diagnosis not present

## 2017-07-14 DIAGNOSIS — Z7982 Long term (current) use of aspirin: Secondary | ICD-10-CM | POA: Diagnosis not present

## 2017-07-14 DIAGNOSIS — Z7901 Long term (current) use of anticoagulants: Secondary | ICD-10-CM | POA: Diagnosis not present

## 2017-07-14 DIAGNOSIS — Z79899 Other long term (current) drug therapy: Secondary | ICD-10-CM | POA: Diagnosis not present

## 2017-07-15 ENCOUNTER — Ambulatory Visit (INDEPENDENT_AMBULATORY_CARE_PROVIDER_SITE_OTHER): Payer: BLUE CROSS/BLUE SHIELD

## 2017-07-15 DIAGNOSIS — Z7901 Long term (current) use of anticoagulants: Secondary | ICD-10-CM | POA: Diagnosis not present

## 2017-07-15 LAB — POCT INR: INR: 2.3

## 2017-07-15 NOTE — Patient Instructions (Signed)
Continue the same dose and repeat in four weeks

## 2017-07-16 ENCOUNTER — Ambulatory Visit (INDEPENDENT_AMBULATORY_CARE_PROVIDER_SITE_OTHER): Payer: BLUE CROSS/BLUE SHIELD | Admitting: Cardiology

## 2017-07-16 ENCOUNTER — Encounter: Payer: Self-pay | Admitting: Cardiology

## 2017-07-16 ENCOUNTER — Encounter (HOSPITAL_COMMUNITY)
Admission: RE | Admit: 2017-07-16 | Discharge: 2017-07-16 | Disposition: A | Discharge status: Home / Self Care | Payer: BLUE CROSS/BLUE SHIELD | Source: Ambulatory Visit | Attending: Cardiology | Admitting: Cardiology

## 2017-07-16 VITALS — BP 114/70 | HR 67 | Ht 67.0 in | Wt 169.0 lb

## 2017-07-16 DIAGNOSIS — Z79899 Other long term (current) drug therapy: Secondary | ICD-10-CM | POA: Diagnosis not present

## 2017-07-16 DIAGNOSIS — Z7982 Long term (current) use of aspirin: Secondary | ICD-10-CM | POA: Diagnosis not present

## 2017-07-16 DIAGNOSIS — Z86718 Personal history of other venous thrombosis and embolism: Secondary | ICD-10-CM | POA: Diagnosis not present

## 2017-07-16 DIAGNOSIS — Z955 Presence of coronary angioplasty implant and graft: Secondary | ICD-10-CM

## 2017-07-16 DIAGNOSIS — Z7901 Long term (current) use of anticoagulants: Secondary | ICD-10-CM | POA: Diagnosis not present

## 2017-07-16 DIAGNOSIS — I2510 Atherosclerotic heart disease of native coronary artery without angina pectoris: Secondary | ICD-10-CM | POA: Diagnosis not present

## 2017-07-16 DIAGNOSIS — E785 Hyperlipidemia, unspecified: Secondary | ICD-10-CM | POA: Diagnosis not present

## 2017-07-16 DIAGNOSIS — I214 Non-ST elevation (NSTEMI) myocardial infarction: Secondary | ICD-10-CM | POA: Diagnosis not present

## 2017-07-16 MED ORDER — LISINOPRIL 2.5 MG PO TABS: 2.5000 mg | ORAL_TABLET | Freq: Every day | ORAL | 3 refills | Status: DC

## 2017-07-16 NOTE — Patient Instructions (Signed)
Medication Instructions:  Start lisinopril 2.5 mg daily   Labwork: 2 weeks   BMET  Testing/Procedures: NONE  Follow-Up: Your physician wants you to follow-up in: 6 MONTHS.  You will receive a reminder letter in the mail two months in advance. If you don't receive a letter, please call our office to schedule the follow-up appointment.   Any Other Special Instructions Will Be Listed Below (If Applicable).     If you need a refill on your cardiac medications before your next appointment, please call your pharmacy.

## 2017-07-16 NOTE — Progress Notes (Signed)
Clinical Summary [name] is a 72 y.o.male seen today for follow up of the following medical problems.    1. CAD - admit Jan 2019 with subcascpular pain, found to have NSTEMI peak trop 31 - cath Jan 2019 staged PCI LCX and LAD.  - plans were for triple therapy with asa, plavix, coumdin (for history of PE) x 1 month, then plavix and coumadin - Jan 2019 echo LVEF 60-65%, no wma's, grade I diatolic dysfunction.   - no recent chest pain. No sob/doe - compliant witth meds   2. 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      Past Medical History:  Diagnosis Date  . Anticoagulated on Coumadin   . Avascular necrosis of hip (HCC)    LEFT  . DVT (deep venous thrombosis) (HCC) 2009  . Dyslipidemia, goal LDL below 70 04/09/2017  . Family history of anesthesia complication    BROTHER HAD MALIGNANT HYPOTHERMIA - PT HAS NOT HAD ANY PROBLEMS WITH ANESTHESIA    . History of kidney stones   . Malignant hyperthermia    PT'S BROTHER HAS HX MALIGNANT HYPERTHERMIA  . NSTEMI (non-ST elevated myocardial infarction) (HCC) 04/09/2017  . Pulmonary embolism (HCC) 2009  . Vertigo    NO RECENT PROBLEMS     Allergies  Allergen Reactions  . Lyrica [Pregabalin]     "went out of head"     Current Outpatient Medications  Medication Sig Dispense Refill  . acetaminophen (TYLENOL) 500 MG tablet Take 2 tablets (1,000 mg total) by mouth every 8 (eight) hours. 30 tablet 0  . atorvastatin (LIPITOR) 80 MG tablet Take 1 tablet (80 mg total) by mouth daily at 6 PM. 90 tablet 3  . carvedilol (COREG) 6.25 MG tablet Take 1 tablet (6.25 mg total) by mouth 2 (two) times daily with a meal. 180 tablet 3  . clopidogrel (PLAVIX) 75 MG tablet Take 1 tablet (75 mg total) by mouth daily. 90 tablet 3  . nitroGLYCERIN  (NITROSTAT) 0.4 MG SL tablet Place 1 tablet (0.4 mg total) under the tongue every 5 (five) minutes x 3 doses as needed for chest pain. 25 tablet 1  . warfarin (COUMADIN) 5 MG tablet TAKE 1 TABLET DAILY (Patient taking differently: TAKE 1 TABLET DAILY except on Tuesday taking 2.5mg ) 90 tablet 3   No current facility-administered medications for this visit.      Past Surgical History:  Procedure Laterality Date  . BACK SURGERY  2009   DISCECTOMY  . CERVICAL FUSION  2008  . CORONARY STENT INTERVENTION N/A 04/09/2017   Procedure: CORONARY STENT INTERVENTION;  Surgeon: Yvonne Kendall, MD;  Location: [facility];  Service: Cardiovascular;  Laterality: N/A;  . CORONARY STENT INTERVENTION N/A 04/12/2017   Procedure: CORONARY STENT INTERVENTION;  Surgeon: Yvonne Kendall, MD;  Location: [facility];  Service: Cardiovascular;  Laterality: N/A;  . INTRAVASCULAR ULTRASOUND/IVUS N/A 04/12/2017   Procedure: Intravascular Ultrasound/IVUS;  Surgeon: Yvonne Kendall, MD;  Location: [facility];  Service: Cardiovascular;  Laterality: N/A;  . LEFT HEART CATH AND CORONARY ANGIOGRAPHY N/A 04/09/2017   Procedure: LEFT HEART CATH AND CORONARY ANGIOGRAPHY;  Surgeon: Yvonne Kendall, MD;  Location: [facility];  Service: Cardiovascular;  Laterality: N/A;  . TOTAL HIP ARTHROPLASTY Right 12/22/2013   Procedure:  RIGHT TOTAL HIP ARTHROPLASTY ANTERIOR APPROACH;  Surgeon: Shelda Pal, MD;  Location: [facility];  Service: Orthopedics;  Laterality: Right;  . TOTAL HIP ARTHROPLASTY Left 04/30/2015   Procedure: LEFT TOTAL HIP ARTHROPLASTY ANTERIOR APPROACH;  Surgeon: Durene Romans, MD;  Location: [facility];  Service: Orthopedics;  Laterality: Left;     Allergies  Allergen Reactions  . Lyrica [Pregabalin]     "went out of head"      Family History  Problem Relation Age of Onset  . Breast cancer Mother   . Hypertension Mother   . Hyperlipidemia Mother   . Heart attack Mother 39  . Stroke  Mother   . Hypertension Paternal Grandmother   . Diabetes Paternal Grandmother   . Cirrhosis Sister   . Cancer Brother      Social History [name] reports that he has never smoked. He has never used smokeless tobacco. [name] reports that he does not drink alcohol.   Review of Systems CONSTITUTIONAL: No weight loss, fever, chills, weakness or fatigue.  HEENT: Eyes: No visual loss, blurred vision, double vision or yellow sclerae.No hearing loss, sneezing, congestion, runny nose or sore throat.  SKIN: No rash or itching.  CARDIOVASCULAR: per hpi RESPIRATORY: No shortness of breath, cough or sputum.  GASTROINTESTINAL: No anorexia, nausea, vomiting or diarrhea. No abdominal pain or blood.  GENITOURINARY: No burning on urination, no polyuria NEUROLOGICAL: No headache, dizziness, syncope, paralysis, ataxia, numbness or tingling in the extremities. No change in bowel or bladder control.  MUSCULOSKELETAL: No muscle, back pain, joint pain or stiffness.  LYMPHATICS: No enlarged nodes. No history of splenectomy.  PSYCHIATRIC: No history of depression or anxiety.  ENDOCRINOLOGIC: No reports of sweating, cold or heat intolerance. No polyuria or polydipsia.  Marland Kitchen   Physical Examination Vitals:   07/16/17 0847  BP: 114/70  Pulse: 67  SpO2: 98%   Vitals:   07/16/17 0847  Weight: 169 lb (76.7 kg)  Height: 5\' 7"  (1.702 m)    Gen: resting comfortably, no acute distress HEENT: no scleral icterus, pupils equal round and reactive, no palptable cervical adenopathy,  CV: RRR, no m/r/g no jvd Resp: Clear to auscultation bilaterally GI: abdomen is soft, non-tender, non-distended, normal bowel sounds, no hepatosplenomegaly MSK: extremities are warm, no edema.  Skin: warm, no rash Neuro:  no focal deficits Psych: appropriate affect   Diagnostic Studies Jan 2019 cath Conclusions: 1. Significant two-vessel coronary artery disease with 80-90% mid LAD stenosis and occluded mid  LCx. 2. Mild to moderate right coronary artery disease. Proximal and mid portions of the LAD and RCA are ectatic/aneurysmal. 3. Mildly to moderately reduced left ventricular contraction with mid anterolateral hypokinesis. Low normal LVEDP. 4. Successful PCI to mid LCx using Resolute Onyx 2.0 x 2.0 x 22 mm DES (post-dilated to 2.6 mm proximally) with 0% residual stenosis and TIMI-3 flow.  Recommendations: 1. Plan for staged PCI to LAD next week, as renal function allows. 2. ASA and ticagrelor given today; recommend switching ticagrelor to clopidogrel prior to discharge. Continue warfarin, clopidogrel, and aspirin x 1 month, then discontinue aspirin and complete at least 12 months of warfarin and clopidogrel. 3. Aggressive secondary prevention.   Jan 2019 cath staged PCI Conclusions: 1. Aneurysmal LAD with sequential moderate to severe stenosis involving the proximal and mid portions of the vessel. 2. Patent stent in the mid LCx. 3. Successful IVUS-guided PCI to the mid LAD with placement of non-overlapping Resolute Onyx 3.5 x 8 mm (proximal) and Resolute Onyx 2.75  x 38 mm (distal) drug-eluting stents with 0% residual stenosis and TIMI-3 flow.  Recommendations: 1. Transition from ticagrelor to clopidogrel; will load with clopidogrel 300 mg x 1 tomorrow morning, followed by 75 mg daily thereafter. 2. Resume warfarin per pharmacy tonight. Patient and his family report that he has been bridged in the past for procedures requiring cessation of warfarin. If there is no evidence of bleeding or vascular injury tomorrow, Lovenox bridge should be started tomorrow morning per pharmacy. 3. Continue aspirin, clopidogrel, and warfarin x 1 month. If INR therapeutic/stable, aspirin can be discontinued at that time. 4. Aggressive secondary prevention.  Jan 2019 echo Study Conclusions  - Left ventricle: The cavity size was normal. Wall thickness was   normal. Systolic function was normal. The estimated  ejection   fraction was in the range of 60% to 65%. Wall motion was normal;   there were no regional wall motion abnormalities. Doppler   parameters are consistent with abnormal left ventricular   relaxation (grade 1 diastolic dysfunction). The E/e&' ratio is   between 8-15, suggesting indeterminate LV filling pressure. - Aortic valve: Trileaflet. Sclerosis without stenosis. There was   trivial regurgitation. - Mitral valve: Mildly thickened leaflets . There was trivial   regurgitation. - Left atrium: The atrium was mildly dilated. - Tricuspid valve: There was trivial regurgitation. - Pulmonary arteries: PA peak pressure: 18 mm Hg (S). - Inferior vena cava: The vessel was normal in size. The   respirophasic diameter changes were in the normal range (>= 50%),   consistent with normal central venous pressure.  Impressions:  - LVEF 60-65%, normal wall thickness, normal wall motion, grade 1   DD, indeterminate LV filling pressure, aortic valve sclerosis   with trivial AI, trivial MR, mild LAE, trivial TR, RVSP 18 mmHg,   normal IVC.   Assessment and Plan   1. CAD - no recent symptoms - continue plavix and coumadin in setting of recent stent as well as prior PE. Avoiding triple therapy due to high bleeding risks.  - given CAD and NSTEMI, start lisinopril 2.5mg  daily for additional cardiovascular outcome benefits. CHeck BMET in 2 weeks.   2. History of PE - defer decision to continue coumadin to pcp and hematology who is following, at this time decision is to continue coumadin.   F/u 6 months   Antoine Poche, M.D

## 2017-07-19 ENCOUNTER — Encounter (HOSPITAL_COMMUNITY)
Admission: RE | Admit: 2017-07-19 | Discharge: 2017-07-19 | Disposition: A | Discharge status: Home / Self Care | Payer: BLUE CROSS/BLUE SHIELD | Source: Ambulatory Visit | Attending: Cardiology | Admitting: Cardiology

## 2017-07-19 DIAGNOSIS — Z7901 Long term (current) use of anticoagulants: Secondary | ICD-10-CM | POA: Diagnosis not present

## 2017-07-19 DIAGNOSIS — I214 Non-ST elevation (NSTEMI) myocardial infarction: Secondary | ICD-10-CM | POA: Diagnosis not present

## 2017-07-19 DIAGNOSIS — I213 ST elevation (STEMI) myocardial infarction of unspecified site: Secondary | ICD-10-CM

## 2017-07-19 DIAGNOSIS — Z955 Presence of coronary angioplasty implant and graft: Secondary | ICD-10-CM | POA: Diagnosis not present

## 2017-07-19 DIAGNOSIS — Z7982 Long term (current) use of aspirin: Secondary | ICD-10-CM | POA: Diagnosis not present

## 2017-07-19 DIAGNOSIS — Z79899 Other long term (current) drug therapy: Secondary | ICD-10-CM | POA: Diagnosis not present

## 2017-07-19 DIAGNOSIS — Z86718 Personal history of other venous thrombosis and embolism: Secondary | ICD-10-CM | POA: Diagnosis not present

## 2017-07-19 DIAGNOSIS — E785 Hyperlipidemia, unspecified: Secondary | ICD-10-CM | POA: Diagnosis not present

## 2017-07-21 ENCOUNTER — Encounter (HOSPITAL_COMMUNITY)
Admission: RE | Admit: 2017-07-21 | Discharge: 2017-07-21 | Disposition: A | Discharge status: Home / Self Care | Payer: BLUE CROSS/BLUE SHIELD | Source: Ambulatory Visit | Attending: Cardiology | Admitting: Cardiology

## 2017-07-21 DIAGNOSIS — Z7982 Long term (current) use of aspirin: Secondary | ICD-10-CM | POA: Diagnosis not present

## 2017-07-21 DIAGNOSIS — I214 Non-ST elevation (NSTEMI) myocardial infarction: Secondary | ICD-10-CM | POA: Diagnosis not present

## 2017-07-21 DIAGNOSIS — E785 Hyperlipidemia, unspecified: Secondary | ICD-10-CM | POA: Diagnosis not present

## 2017-07-21 DIAGNOSIS — Z7901 Long term (current) use of anticoagulants: Secondary | ICD-10-CM | POA: Insufficient documentation

## 2017-07-21 DIAGNOSIS — Z955 Presence of coronary angioplasty implant and graft: Secondary | ICD-10-CM | POA: Diagnosis not present

## 2017-07-21 DIAGNOSIS — Z79899 Other long term (current) drug therapy: Secondary | ICD-10-CM | POA: Insufficient documentation

## 2017-07-21 DIAGNOSIS — Z86718 Personal history of other venous thrombosis and embolism: Secondary | ICD-10-CM | POA: Diagnosis not present

## 2017-07-23 ENCOUNTER — Encounter: Payer: Self-pay | Admitting: Cardiology

## 2017-07-23 ENCOUNTER — Encounter (HOSPITAL_COMMUNITY)
Admission: RE | Admit: 2017-07-23 | Discharge: 2017-07-23 | Disposition: A | Discharge status: Home / Self Care | Payer: BLUE CROSS/BLUE SHIELD | Source: Ambulatory Visit | Attending: Cardiology | Admitting: Cardiology

## 2017-07-23 DIAGNOSIS — I214 Non-ST elevation (NSTEMI) myocardial infarction: Secondary | ICD-10-CM

## 2017-07-23 DIAGNOSIS — Z86718 Personal history of other venous thrombosis and embolism: Secondary | ICD-10-CM | POA: Diagnosis not present

## 2017-07-23 DIAGNOSIS — Z7901 Long term (current) use of anticoagulants: Secondary | ICD-10-CM | POA: Diagnosis not present

## 2017-07-23 DIAGNOSIS — Z955 Presence of coronary angioplasty implant and graft: Secondary | ICD-10-CM

## 2017-07-23 DIAGNOSIS — Z7982 Long term (current) use of aspirin: Secondary | ICD-10-CM | POA: Diagnosis not present

## 2017-07-23 DIAGNOSIS — Z79899 Other long term (current) drug therapy: Secondary | ICD-10-CM | POA: Diagnosis not present

## 2017-07-23 DIAGNOSIS — E785 Hyperlipidemia, unspecified: Secondary | ICD-10-CM | POA: Diagnosis not present

## 2017-07-26 ENCOUNTER — Encounter (HOSPITAL_COMMUNITY)
Admission: RE | Admit: 2017-07-26 | Discharge: 2017-07-26 | Disposition: A | Discharge status: Home / Self Care | Payer: BLUE CROSS/BLUE SHIELD | Source: Ambulatory Visit | Attending: Cardiology | Admitting: Cardiology

## 2017-07-26 DIAGNOSIS — I214 Non-ST elevation (NSTEMI) myocardial infarction: Secondary | ICD-10-CM

## 2017-07-26 DIAGNOSIS — Z955 Presence of coronary angioplasty implant and graft: Secondary | ICD-10-CM | POA: Diagnosis not present

## 2017-07-26 DIAGNOSIS — Z86718 Personal history of other venous thrombosis and embolism: Secondary | ICD-10-CM | POA: Diagnosis not present

## 2017-07-26 DIAGNOSIS — Z7982 Long term (current) use of aspirin: Secondary | ICD-10-CM | POA: Diagnosis not present

## 2017-07-26 DIAGNOSIS — Z79899 Other long term (current) drug therapy: Secondary | ICD-10-CM | POA: Diagnosis not present

## 2017-07-26 DIAGNOSIS — E785 Hyperlipidemia, unspecified: Secondary | ICD-10-CM | POA: Diagnosis not present

## 2017-07-26 DIAGNOSIS — Z7901 Long term (current) use of anticoagulants: Secondary | ICD-10-CM | POA: Diagnosis not present

## 2017-07-28 ENCOUNTER — Encounter (HOSPITAL_COMMUNITY)
Admission: RE | Admit: 2017-07-28 | Discharge: 2017-07-28 | Disposition: A | Discharge status: Home / Self Care | Payer: BLUE CROSS/BLUE SHIELD | Source: Ambulatory Visit | Attending: Cardiology | Admitting: Cardiology

## 2017-07-28 DIAGNOSIS — Z86718 Personal history of other venous thrombosis and embolism: Secondary | ICD-10-CM | POA: Diagnosis not present

## 2017-07-28 DIAGNOSIS — I214 Non-ST elevation (NSTEMI) myocardial infarction: Secondary | ICD-10-CM

## 2017-07-28 DIAGNOSIS — Z7901 Long term (current) use of anticoagulants: Secondary | ICD-10-CM | POA: Diagnosis not present

## 2017-07-28 DIAGNOSIS — Z955 Presence of coronary angioplasty implant and graft: Secondary | ICD-10-CM

## 2017-07-28 DIAGNOSIS — Z7982 Long term (current) use of aspirin: Secondary | ICD-10-CM | POA: Diagnosis not present

## 2017-07-28 DIAGNOSIS — Z79899 Other long term (current) drug therapy: Secondary | ICD-10-CM | POA: Diagnosis not present

## 2017-07-28 DIAGNOSIS — E785 Hyperlipidemia, unspecified: Secondary | ICD-10-CM | POA: Diagnosis not present

## 2017-07-29 ENCOUNTER — Other Ambulatory Visit: Payer: Self-pay | Admitting: Cardiology

## 2017-07-29 DIAGNOSIS — Z79899 Other long term (current) drug therapy: Secondary | ICD-10-CM | POA: Diagnosis not present

## 2017-07-30 ENCOUNTER — Encounter (HOSPITAL_COMMUNITY)
Admission: RE | Admit: 2017-07-30 | Discharge: 2017-07-30 | Disposition: A | Discharge status: Home / Self Care | Payer: BLUE CROSS/BLUE SHIELD | Source: Ambulatory Visit | Attending: Cardiology | Admitting: Cardiology

## 2017-07-30 DIAGNOSIS — I214 Non-ST elevation (NSTEMI) myocardial infarction: Secondary | ICD-10-CM

## 2017-07-30 DIAGNOSIS — I213 ST elevation (STEMI) myocardial infarction of unspecified site: Secondary | ICD-10-CM

## 2017-07-30 DIAGNOSIS — Z7901 Long term (current) use of anticoagulants: Secondary | ICD-10-CM | POA: Diagnosis not present

## 2017-07-30 DIAGNOSIS — E785 Hyperlipidemia, unspecified: Secondary | ICD-10-CM | POA: Diagnosis not present

## 2017-07-30 DIAGNOSIS — Z79899 Other long term (current) drug therapy: Secondary | ICD-10-CM | POA: Diagnosis not present

## 2017-07-30 DIAGNOSIS — Z955 Presence of coronary angioplasty implant and graft: Secondary | ICD-10-CM

## 2017-07-30 DIAGNOSIS — Z86718 Personal history of other venous thrombosis and embolism: Secondary | ICD-10-CM | POA: Diagnosis not present

## 2017-07-30 DIAGNOSIS — Z7982 Long term (current) use of aspirin: Secondary | ICD-10-CM | POA: Diagnosis not present

## 2017-07-30 LAB — BASIC METABOLIC PANEL
BUN/Creatinine Ratio: 13 (ref 10–24)
BUN: 12 mg/dL (ref 8–27)
CALCIUM: 8.8 mg/dL (ref 8.6–10.2)
CO2: 23 mmol/L (ref 20–29)
Chloride: 104 mmol/L (ref 96–106)
Creatinine, Ser: 0.95 mg/dL (ref 0.76–1.27)
GFR calc Af Amer: 93 mL/min/{1.73_m2} (ref >59)
GFR, EST NON AFRICAN AMERICAN: 81 mL/min/{1.73_m2} (ref >59)
Glucose: 88 mg/dL (ref 65–99)
POTASSIUM: 3.6 mmol/L (ref 3.5–5.2)
Sodium: 141 mmol/L (ref 134–144)

## 2017-07-30 LAB — SPECIMEN STATUS REPORT

## 2017-08-02 ENCOUNTER — Encounter (HOSPITAL_COMMUNITY)
Admission: RE | Admit: 2017-08-02 | Discharge: 2017-08-02 | Disposition: A | Discharge status: Home / Self Care | Payer: BLUE CROSS/BLUE SHIELD | Source: Ambulatory Visit | Attending: Cardiology | Admitting: Cardiology

## 2017-08-02 DIAGNOSIS — Z7901 Long term (current) use of anticoagulants: Secondary | ICD-10-CM | POA: Diagnosis not present

## 2017-08-02 DIAGNOSIS — I213 ST elevation (STEMI) myocardial infarction of unspecified site: Secondary | ICD-10-CM

## 2017-08-02 DIAGNOSIS — Z955 Presence of coronary angioplasty implant and graft: Secondary | ICD-10-CM

## 2017-08-02 DIAGNOSIS — Z86718 Personal history of other venous thrombosis and embolism: Secondary | ICD-10-CM | POA: Diagnosis not present

## 2017-08-02 DIAGNOSIS — I214 Non-ST elevation (NSTEMI) myocardial infarction: Secondary | ICD-10-CM

## 2017-08-02 DIAGNOSIS — Z7982 Long term (current) use of aspirin: Secondary | ICD-10-CM | POA: Diagnosis not present

## 2017-08-02 DIAGNOSIS — E785 Hyperlipidemia, unspecified: Secondary | ICD-10-CM | POA: Diagnosis not present

## 2017-08-02 DIAGNOSIS — Z79899 Other long term (current) drug therapy: Secondary | ICD-10-CM | POA: Diagnosis not present

## 2017-08-03 ENCOUNTER — Telehealth: Payer: Self-pay | Admitting: Family Medicine

## 2017-08-03 NOTE — Telephone Encounter (Signed)
We do not "view"tests that specialissts orders. we can refer to them in future if need be, it is the responsibility of the ordering physixcan to communicate the results to the pt and document their advice in the chart, rec if do not hear soon contact ordering physician

## 2017-08-03 NOTE — Telephone Encounter (Signed)
Patient wants to know if Dr. Brett Canales has viewed the blood work results in Epic that Dr. Wyline Mood ordered?  He completed these last week.

## 2017-08-04 ENCOUNTER — Encounter (HOSPITAL_COMMUNITY)
Admission: RE | Admit: 2017-08-04 | Discharge: 2017-08-04 | Disposition: A | Discharge status: Home / Self Care | Payer: BLUE CROSS/BLUE SHIELD | Source: Ambulatory Visit | Attending: Cardiology | Admitting: Cardiology

## 2017-08-04 DIAGNOSIS — Z7982 Long term (current) use of aspirin: Secondary | ICD-10-CM | POA: Diagnosis not present

## 2017-08-04 DIAGNOSIS — Z86718 Personal history of other venous thrombosis and embolism: Secondary | ICD-10-CM | POA: Diagnosis not present

## 2017-08-04 DIAGNOSIS — I214 Non-ST elevation (NSTEMI) myocardial infarction: Secondary | ICD-10-CM | POA: Diagnosis not present

## 2017-08-04 DIAGNOSIS — Z955 Presence of coronary angioplasty implant and graft: Secondary | ICD-10-CM | POA: Diagnosis not present

## 2017-08-04 DIAGNOSIS — Z7901 Long term (current) use of anticoagulants: Secondary | ICD-10-CM | POA: Diagnosis not present

## 2017-08-04 DIAGNOSIS — E785 Hyperlipidemia, unspecified: Secondary | ICD-10-CM | POA: Diagnosis not present

## 2017-08-04 DIAGNOSIS — Z79899 Other long term (current) drug therapy: Secondary | ICD-10-CM | POA: Diagnosis not present

## 2017-08-04 NOTE — Telephone Encounter (Signed)
Patient advised per Dr Brett Canales: We do not "view"tests that specialissts orders. we can refer to them in future if need be, it is the responsibility of the ordering physixcan to communicate the results to the pt and document their advice in the chart, rec if do not hear soon contact ordering physician. Patient verbalized understanding and stated he just wanted to make sure we got a copy of the results.

## 2017-08-05 ENCOUNTER — Encounter (HOSPITAL_COMMUNITY): Payer: Self-pay

## 2017-08-05 ENCOUNTER — Telehealth: Payer: Self-pay | Admitting: Cardiology

## 2017-08-05 NOTE — Telephone Encounter (Signed)
Returning to work really depends on how is he feeling. There is not standard amount of time we have to keep him out, everyone is different and requires different amount of time to recover. If he is continuing to do well without symptoms Im ok with him returning to work in the near future, as early as next week if he feels well. We'd like to have him continue cardiac rehab if possible but would not keep him out of work solely for that if he is other wise feeling well   Dominga Ferry MD

## 2017-08-05 NOTE — Telephone Encounter (Signed)
Patient nearing end of cardiac rehab.Works as a Archivist , self employ. He wants to know when he is cleared to go back to work without restrictions

## 2017-08-05 NOTE — Telephone Encounter (Signed)
He states he feels great.No c/o CP, SOB, fatigue. 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 He is anxious to return to his business

## 2017-08-05 NOTE — Progress Notes (Signed)
Cardiac Individual Treatment Plan  Patient Details  Name: [name]. MRN: [id number] Date of Birth: 09-22-1945 Referring Provider:     CARDIAC REHAB PHASE II ORIENTATION from 06/08/2017 in [facility] CARDIAC REHAB  Referring Provider  Donato Schultz MD      Initial Encounter Date:    CARDIAC REHAB PHASE II ORIENTATION from 06/08/2017 in [facility] CARDIAC REHAB  Date  06/08/17  Referring Provider  Donato Schultz MD      Visit Diagnosis: S/P drug eluting coronary stent placement  NSTEMI (non-ST elevated myocardial infarction) (HCC)  Patient's Home Medications on Admission:  Current Outpatient Medications:  .  acetaminophen (TYLENOL) 500 MG tablet, Take 2 tablets (1,000 mg total) by mouth every 8 (eight) hours., Disp: 30 tablet, Rfl: 0 .  atorvastatin (LIPITOR) 80 MG tablet, Take 1 tablet (80 mg total) by mouth daily at 6 PM., Disp: 90 tablet, Rfl: 3 .  carvedilol (COREG) 6.25 MG tablet, Take 1 tablet (6.25 mg total) by mouth 2 (two) times daily with a meal., Disp: 180 tablet, Rfl: 3 .  clopidogrel (PLAVIX) 75 MG tablet, Take 1 tablet (75 mg total) by mouth daily., Disp: 90 tablet, Rfl: 3 .  lisinopril (PRINIVIL,ZESTRIL) 2.5 MG tablet, Take 1 tablet (2.5 mg total) by mouth daily., Disp: 90 tablet, Rfl: 3 .  nitroGLYCERIN (NITROSTAT) 0.4 MG SL tablet, Place 1 tablet (0.4 mg total) under the tongue every 5 (five) minutes x 3 doses as needed for chest pain., Disp: 25 tablet, Rfl: 1 .  warfarin (COUMADIN) 5 MG tablet, TAKE 1 TABLET DAILY (Patient taking differently: TAKE 1 TABLET DAILY except on Tuesday taking 2.5mg ), Disp: 90 tablet, Rfl: 3  Past Medical History: Past Medical History:  Diagnosis Date  . Anticoagulated on Coumadin   . Avascular necrosis of hip (HCC)    LEFT  . DVT (deep venous thrombosis) (HCC) 2009  . Dyslipidemia, goal LDL below 70 04/09/2017  . Family history of anesthesia complication    BROTHER HAD MALIGNANT HYPOTHERMIA -  PT HAS NOT HAD ANY PROBLEMS WITH ANESTHESIA    . History of kidney stones   . Malignant hyperthermia    PT'S BROTHER HAS HX MALIGNANT HYPERTHERMIA  . NSTEMI (non-ST elevated myocardial infarction) (HCC) 04/09/2017  . Pulmonary embolism (HCC) 2009  . Vertigo    NO RECENT PROBLEMS    Tobacco Use: Social History   Tobacco Use  Smoking Status Never Smoker  Smokeless Tobacco Never Used    Labs: Recent Review Flowsheet Data    Labs for ITP Cardiac and Pulmonary Rehab Latest Ref Rng & Units 03/02/2015 03/09/2016 04/09/2017 04/10/2017 05/20/2017   Cholestrol 100 - 199 mg/dL - 478 - 295 62(Z)   LDLCALC 0 - 99 mg/dL - 308(M) - 64 40   HDL >39 mg/dL - 57(Q) - 46(N) 62(X)   Trlycerides 0 - 149 mg/dL - 528(U) - 87 80   Hemoglobin A1c 4.8 - 5.6 % - - 5.6 - -   TCO2 0 - 100 mmol/L 24 - - - -      Capillary Blood Glucose: No results found for: GLUCAP   Exercise Target Goals:    Exercise Program Goal: Individual exercise prescription set using results from initial 6 min walk test and THRR while considering  patient's activity barriers and safety.   Exercise Prescription Goal: Initial exercise prescription builds to 30-45 minutes a day of aerobic activity, 2-3 days per week.  Home exercise guidelines will be given to  patient during program as part of exercise prescription that the participant will acknowledge.  Activity Barriers & Risk Stratification: Activity Barriers & Cardiac Risk Stratification - 06/08/17 1036      Activity Barriers & Cardiac Risk Stratification   Activity Barriers  Other (comment);Left Hip Replacement;Right Hip Replacement    Comments  Herniated disc. Rupture disc.    Cardiac Risk Stratification  High       6 Minute Walk: 6 Minute Walk    Row Name 06/08/17 1035         6 Minute Walk   Phase  Initial     Distance  1839 feet     Walk Time  6 minutes     # of Rest Breaks  0     MPH  3.5     METS  3.9     RPE  8     VO2 Peak  13.88     Symptoms  No      Resting HR  73 bpm     Resting BP  128/72     Resting Oxygen Saturation   98 %     Exercise Oxygen Saturation  during 6 min walk  99 %     Max Ex. HR  98 bpm     Max Ex. BP  144/70     2 Minute Post BP  122/70        Oxygen Initial Assessment:   Oxygen Re-Evaluation:   Oxygen Discharge (Final Oxygen Re-Evaluation):   Initial Exercise Prescription: Initial Exercise Prescription - 06/08/17 1100      Date of Initial Exercise RX and Referring Provider   Date  06/08/17    Referring Provider  Donato Schultz MD      Treadmill   MPH  3    Grade  1    Minutes  10    METs  3.71      Bike   Level  0.8    Minutes  10    METs  2.89      NuStep   Level  3    SPM  85    Minutes  10    METs  2.5      Prescription Details   Frequency (times per week)  3    Duration  Progress to 30 minutes of continuous aerobic without signs/symptoms of physical distress      Intensity   THRR 40-80% of Max Heartrate  60-120    Ratings of Perceived Exertion  11-13    Perceived Dyspnea  0-4      Progression   Progression  Continue to progress workloads to maintain intensity without signs/symptoms of physical distress.      Resistance Training   Training Prescription  Yes    Weight  4lbs    Reps  10-15       Perform Capillary Blood Glucose checks as needed.  Exercise Prescription Changes: Exercise Prescription Changes    Row Name 06/14/17 1000 06/23/17 1605 07/05/17 1000 07/28/17 1100 08/04/17 1600     Response to Exercise   Blood Pressure (Admit)  130/64  124/70  128/62  102/60  124/62   Blood Pressure (Exercise)  170/88  150/70  152/82  140/60  144/80   Blood Pressure (Exit)  138/72  110/64  118/70  108/62  128/60   Heart Rate (Admit)  78 bpm  73 bpm  70 bpm  71 bpm  69 bpm   Heart Rate (Exercise)  117 bpm  122 bpm  129 bpm  130 bpm  127 bpm   Heart Rate (Exit)  80 bpm  76 bpm  79 bpm  69 bpm  65 bpm   Rating of Perceived Exertion (Exercise)  11  11  12  14  14    Perceived  Dyspnea (Exercise)  0  0  0  0  0   Symptoms  None  None  None  None  None   Comments  Pt oriented to exercise equipment  -  -  -  -   Duration  Progress to 30 minutes of  aerobic without signs/symptoms of physical distress  Continue with 30 min of aerobic exercise without signs/symptoms of physical distress.  Continue with 30 min of aerobic exercise without signs/symptoms of physical distress.  Continue with 30 min of aerobic exercise without signs/symptoms of physical distress.  Progress to 45 minutes of aerobic exercise without signs/symptoms of physical distress   Intensity  THRR New  THRR unchanged  THRR unchanged  THRR unchanged  THRR unchanged     Progression   Progression  Continue to progress workloads to maintain intensity without signs/symptoms of physical distress.  Continue to progress workloads to maintain intensity without signs/symptoms of physical distress.  Continue to progress workloads to maintain intensity without signs/symptoms of physical distress.  Continue to progress workloads to maintain intensity without signs/symptoms of physical distress.  Continue to progress workloads to maintain intensity without signs/symptoms of physical distress.   Average METs  2.9  4.01  4.6  5.1  5.04     Resistance Training   Training Prescription  Yes  No  Yes  No  No   Weight  4lbs  -  5lbs  -  -   Reps  10-15  -  10-15  -  -   Time  10 Minutes  -  10 Minutes  -  -     Interval Training   Interval Training  -  No  No  No  No     Treadmill   MPH  3  3  3  3   3.5   Grade  1  1  2  3  3    Minutes  10  10  10  10  10    METs  3.71  3.71  4.12  4.54  5.13     Bike   Level  0.8  1.5  1.5  1.5  1.5   Minutes  10  10  10  10  10    METs  2.9  4.62  4.69  4.66  4.69     NuStep   Level  3  4  5  6  6    SPM  85  85  85  95  95   Minutes  10  10  10  10  10    METs  3.1  3.7  5  6.1  5.3     Home Exercise Plan   Plans to continue exercise at  -  -  Home (comment) Walking & Stationary  Bike  Home (comment) Walking & Stationary Bike   Home (comment) Walking & Stationary Bike    Frequency  -  -  Add 3 additional days to program exercise sessions.  Add 3 additional days to program exercise sessions.  Add 3 additional days to program exercise sessions.   Initial Home Exercises Provided  -  -  07/05/17  07/05/17  07/05/17      Exercise Comments: Exercise Comments    Row Name 06/14/17 1014 07/05/17 1000 07/05/17 1004 08/04/17 1627     Exercise Comments  Pt oriented to exercise equipment. Pt off to a great start with exericse. Will continue to monitor and progress pt's exercise prescription.   Reviewed Home Exercise with pt. Pt is exercising 2 addtional days at home. 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 Pt is exercising 2 addtional days at home. 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 Pt is responding well to exericse workloads. Will continue to monitor and progress pt as tolerated.   Reviewed METs and Goals with pt. Pt is still responding well to workloads. Will continue to monitor and progress pt.        Exercise Goals and Review: Exercise Goals    Row Name 06/08/17 1121             Exercise Goals   Increase Physical Activity  Yes       Intervention  Provide advice, education, support and counseling about physical activity/exercise needs.;Develop an individualized exercise prescription for aerobic and resistive training based on initial evaluation findings, risk stratification, comorbidities and participant's personal goals.       Expected Outcomes  Short Term: Attend rehab on a regular basis to increase amount of physical activity.;Long Term: Exercising regularly at least 3-5 days a week.;Long Term: Add in home exercise to make exercise part of routine and to increase amount of physical activity.       Increase Strength and Stamina  Yes       Intervention  Provide advice, education, support  and counseling about physical activity/exercise needs.;Develop an individualized exercise prescription for aerobic and resistive training based on initial evaluation findings, risk stratification, comorbidities and participant's personal goals.       Expected Outcomes  Short Term: Increase workloads from initial exercise prescription for resistance, speed, and METs.;Short Term: Perform resistance training exercises routinely during rehab and add in resistance training at home;Long Term: Improve cardiorespiratory fitness, muscular endurance and strength as measured by increased METs and functional capacity ( )       Able to understand and use rate of perceived exertion (RPE) scale  Yes       Intervention  Provide education and explanation on how to use RPE scale       Expected Outcomes  Short Term: Able to use RPE daily in rehab to express subjective intensity level;Long Term:  Able to use RPE to guide intensity level when exercising independently       Knowledge and understanding of Target Heart Rate Range (THRR)  Yes       Intervention  Provide education and explanation of THRR including how the numbers were predicted and where they are located for reference       Expected Outcomes  Short Term: Able to state/look up THRR;Long Term: Able to use THRR to govern intensity when exercising independently;Short Term: Able to use daily as guideline for intensity in rehab       Able to check pulse independently  Yes       Intervention  Provide education and demonstration on how to check pulse in carotid and radial arteries.;Review the importance of being able to check your own pulse for safety during independent exercise       Expected Outcomes  Short Term: Able to explain why  pulse checking is important during independent exercise;Long Term: Able to check pulse independently and accurately       Understanding of Exercise Prescription  Yes       Intervention  Provide education, explanation, and written  materials on patient's individual exercise prescription       Expected Outcomes  Short Term: Able to explain program exercise prescription;Long Term: Able to explain home exercise prescription to exercise independently          Exercise Goals Re-Evaluation : Exercise Goals Re-Evaluation    Row Name 07/05/17 1004 08/04/17 1630           Exercise Goal Re-Evaluation   Exercise Goals Review  Increase Physical Activity;Increase Strength and Stamina;Able to understand and use rate of perceived exertion (RPE) scale;Knowledge and understanding of Target Heart Rate Range (THRR);Able to check pulse independently;Understanding of Exercise Prescription  Understanding of Exercise Prescription;Increase Physical Activity      Comments  Reviewed HEP with pt. Also reviewed THRR, RPE Scale, weather conditions, NTG use, endpoints of exercise, warmup and cool down. Pt is responding well to his exercise prescription. Pt is now exercising at a level 5 on Nustep and 1.5 on Bike.   Pt has been progressing well in program. Pt is still waiting to be cleared from weight restrictions by physicians. Pt is eager to get back to normal activities. Pt is feeling stronger and feeling more like hisself.      Expected Outcomes  Pt will continue to increase cardiovascular and muscular strength. Pt will continue to exercise 2 additonal days for 30 minutes. Will continue to progress pt as tolerated.   Pt will continue to improve functional capacity and strength. Pt will continue to exercise 5-6 days a week for 30-45 minutes. Will continue to monitor and progress pt.           Discharge Exercise Prescription (Final Exercise Prescription Changes): Exercise Prescription Changes - 08/04/17 1600      Response to Exercise   Blood Pressure (Admit)  124/62    Blood Pressure (Exercise)  144/80    Blood Pressure (Exit)  128/60    Heart Rate (Admit)  69 bpm    Heart Rate (Exercise)  127 bpm    Heart Rate (Exit)  65 bpm    Rating of  Perceived Exertion (Exercise)  14    Perceived Dyspnea (Exercise)  0    Symptoms  None    Duration  Progress to 45 minutes of aerobic exercise without signs/symptoms of physical distress    Intensity  THRR unchanged      Progression   Progression  Continue to progress workloads to maintain intensity without signs/symptoms of physical distress.    Average METs  5.04      Resistance Training   Training Prescription  No      Interval Training   Interval Training  No      Treadmill   MPH  3.5    Grade  3    Minutes  10    METs  5.13      Bike   Level  1.5    Minutes  10    METs  4.69      NuStep   Level  6    SPM  95    Minutes  10    METs  5.3      Home Exercise Plan   Plans to continue exercise at  Home (comment) Walking & Stationary Bike  Frequency  Add 3 additional days to program exercise sessions.    Initial Home Exercises Provided  07/05/17       Nutrition:  Target Goals: Understanding of nutrition guidelines, daily intake of sodium 1500mg , cholesterol 200mg , calories 30% from fat and 7% or less from saturated fats, daily to have 5 or more servings of fruits and vegetables.  Biometrics: Pre Biometrics - 06/08/17 1122      Pre Biometrics   Height  5' 7.75" (1.721 m)    Weight  175 lb 4.3 oz (79.5 kg)    Waist Circumference  36.5 inches    Hip Circumference  39 inches    Waist to Hip Ratio  0.94 %    BMI (Calculated)  26.84    Triceps Skinfold  12 mm    % Body Fat  24.8 %    Grip Strength  41.5 kg    Flexibility  8 in    Single Leg Stand  11.88 seconds        Nutrition Therapy Plan and Nutrition Goals: Nutrition Therapy & Goals - 06/08/17 0921      Nutrition Therapy   Diet  Heart Healthy    Drug/Food Interactions  Coumadin/Vit K      Personal Nutrition Goals   Nutrition Goal  Pt to describe the benefit of including fruits, vegetables, whole grains, and low-fat dairy products in a heart healthy meal plan.      Intervention Plan    Intervention  Prescribe, educate and counsel regarding individualized specific dietary modifications aiming towards targeted core components such as weight, hypertension, lipid management, diabetes, heart failure and other comorbidities.    Expected Outcomes  Short Term Goal: Understand basic principles of dietary content, such as calories, fat, sodium, cholesterol and nutrients.;Long Term Goal: Adherence to prescribed nutrition plan.       Nutrition Assessments: Nutrition Assessments - 06/08/17 0921      MEDFICTS Scores   Pre Score  24       Nutrition Goals Re-Evaluation:   Nutrition Goals Re-Evaluation:   Nutrition Goals Discharge (Final Nutrition Goals Re-Evaluation):   Psychosocial: Target Goals: Acknowledge presence or absence of significant depression and/or stress, maximize coping skills, provide positive support system. Participant is able to verbalize types and ability to use techniques and skills needed for reducing stress and depression.  Initial Review & Psychosocial Screening: Initial Psych Review & Screening - 06/08/17 1337      Initial Review   Current issues with  None Identified      Family Dynamics   Good Support System?  Yes    Comments  Pt has support from wife       Barriers   Psychosocial barriers to participate in program  There are no identifiable barriers or psychosocial needs.      Screening Interventions   Interventions  Encouraged to exercise    Expected Outcomes  Short Term goal: Identification and review with participant of any Quality of Life or Depression concerns found by scoring the questionnaire.;Long Term goal: The participant improves quality of Life and PHQ9 Scores as seen by post scores and/or verbalization of changes       Quality of Life Scores: Quality of Life - 06/08/17 0748      Quality of Life Scores   Health/Function Pre  29.6 %    Socioeconomic Pre  30 %    Psych/Spiritual Pre  30 %    Family Pre  30 %    GLOBAL Pre  29.83 %      Scores of 19 and below usually indicate a poorer quality of life in these areas.  A difference of  2-3 points is a clinically meaningful difference.  A difference of 2-3 points in the total score of the Quality of Life Index has been associated with significant improvement in overall quality of life, self-image, physical symptoms, and general health in studies assessing change in quality of life.  PHQ-9: Recent Review Flowsheet Data    Depression screen [facility] 2/9 06/14/2017   Decreased Interest 0   Down, Depressed, Hopeless 0   PHQ - 2 Score 0     Interpretation of Total Score  Total Score Depression Severity:  1-4 = Minimal depression, 5-9 = Mild depression, 10-14 = Moderate depression, 15-19 = Moderately severe depression, 20-27 = Severe depression   Psychosocial Evaluation and Intervention: Psychosocial Evaluation - 06/14/17 0759      Psychosocial Evaluation & Interventions   Interventions  Encouraged to exercise with the program and follow exercise prescription    Comments  No pyschosocial needs identified. No interventions needed. Pt enjoys hunting, his profession as Investment banker, corporate, and babysitting his grandchildren.    Expected Outcomes  Pt will exhibit positive outlook with good coping skills.     Continue Psychosocial Services   No Follow up required       Psychosocial Re-Evaluation: Psychosocial Re-Evaluation    Row Name 07/07/17 1109 08/05/17 0749           Psychosocial Re-Evaluation   Current issues with  None Identified  None Identified      Comments  No psychosocial needs identified. No interventions necessary.   No psychosocial needs identified. No interventions necessary.       Expected Outcomes  Pt will continue to exhibit positive outlook with good coping skills.   Pt will continue to exhibit positive outlook with good coping skills.       Interventions  Encouraged to attend Cardiac Rehabilitation for the exercise  Encouraged to attend Cardiac  Rehabilitation for the exercise      Continue Psychosocial Services   No Follow up required  No Follow up required         Psychosocial Discharge (Final Psychosocial Re-Evaluation): Psychosocial Re-Evaluation - 08/05/17 0749      Psychosocial Re-Evaluation   Current issues with  None Identified    Comments  No psychosocial needs identified. No interventions necessary.     Expected Outcomes  Pt will continue to exhibit positive outlook with good coping skills.     Interventions  Encouraged to attend Cardiac Rehabilitation for the exercise    Continue Psychosocial Services   No Follow up required       Vocational Rehabilitation: Provide vocational rehab assistance to qualifying candidates.   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       Education: Education Goals: Education classes will be provided on a weekly basis, covering required topics. Participant will state understanding/return demonstration of topics presented.  Learning Barriers/Preferences: Learning Barriers/Preferences - 06/08/17 1034      Learning Barriers/Preferences   Learning Barriers  None    Learning Preferences  Skilled Demonstration       Education Topics: Count Your Pulse:  -Group instruction provided by verbal instruction, demonstration, patient participation and written  materials to support subject.  Instructors address importance of being able to find your pulse and how to count your pulse when at home without a heart monitor.  Patients get hands on experience counting their pulse with staff help and individually.   Heart Attack, Angina, and Risk Factor Modification:  -Group instruction provided by verbal instruction, video, and written materials to support subject.  Instructors address signs and  symptoms of angina and heart attacks.    Also discuss risk factors for heart disease and how to make changes to improve heart health risk factors.   Functional Fitness:  -Group instruction provided by verbal instruction, demonstration, patient participation, and written materials to support subject.  Instructors address safety measures for doing things around the house.  Discuss how to get up and down off the floor, how to pick things up properly, how to safely get out of a chair without assistance, and balance training.   Meditation and Mindfulness:  -Group instruction provided by verbal instruction, patient participation, and written materials to support subject.  Instructor addresses importance of mindfulness and meditation practice to help reduce stress and improve awareness.  Instructor also leads participants through a meditation exercise.    Stretching for Flexibility and Mobility:  -Group instruction provided by verbal instruction, patient participation, and written materials to support subject.  Instructors lead participants through series of stretches that are designed to increase flexibility thus improving mobility.  These stretches are additional exercise for major muscle groups that are typically performed during regular warm up and cool down.   Hands Only CPR:  -Group verbal, video, and participation provides a basic overview of AHA guidelines for community CPR. Role-play of emergencies allow participants the opportunity to practice calling for help and chest compression technique with discussion of AED use.   Hypertension: -Group verbal and written instruction that provides a basic overview of hypertension including the most recent diagnostic guidelines, risk factor reduction with self-care instructions and medication management.    Nutrition I class: Heart Healthy Eating:  -Group instruction provided by PowerPoint slides, verbal discussion, and written materials to support  subject matter. The instructor gives an explanation and review of the Therapeutic Lifestyle Changes diet recommendations, which includes a discussion on lipid goals, dietary fat, sodium, fiber, plant stanol/sterol esters, sugar, and the components of a well-balanced, healthy diet.   CARDIAC REHAB PHASE II ORIENTATION from 06/08/2017 in [facility] CARDIAC REHAB  Date  06/08/17  Educator  RD      Nutrition II class: Lifestyle Skills:  -Group instruction provided by PowerPoint slides, verbal discussion, and written materials to support subject matter. The instructor gives an explanation and review of label reading, grocery shopping for heart health, heart healthy recipe modifications, and ways to make healthier choices when eating out.   CARDIAC REHAB PHASE II ORIENTATION from 06/08/2017 in [facility] CARDIAC REHAB  Date  06/08/17  Educator  RD      Diabetes Question & Answer:  -Group instruction provided by PowerPoint slides, verbal discussion, and written materials to support subject matter. The instructor gives an explanation and review of diabetes co-morbidities, pre- and post-prandial blood glucose goals, pre-exercise blood glucose goals, signs, symptoms, and treatment of hypoglycemia and hyperglycemia, and foot care basics.   Diabetes Blitz:  -Group instruction provided by PowerPoint slides, verbal discussion, and written materials to support subject matter. The instructor gives an explanation and review of the physiology behind type 1 and type 2 diabetes, diabetes medications and rational  behind using different medications, pre- and post-prandial blood glucose recommendations and Hemoglobin A1c goals, diabetes diet, and exercise including blood glucose guidelines for exercising safely.    Portion Distortion:  -Group instruction provided by PowerPoint slides, verbal discussion, written materials, and food models to support subject matter. The instructor  gives an explanation of serving size versus portion size, changes in portions sizes over the last 20 years, and what consists of a serving from each food group.   Stress Management:  -Group instruction provided by verbal instruction, video, and written materials to support subject matter.  Instructors review role of stress in heart disease and how to cope with stress positively.     Exercising on Your Own:  -Group instruction provided by verbal instruction, power point, and written materials to support subject.  Instructors discuss benefits of exercise, components of exercise, frequency and intensity of exercise, and end points for exercise.  Also discuss use of nitroglycerin and activating EMS.  Review options of places to exercise outside of rehab.  Review guidelines for sex with heart disease.   Cardiac Drugs I:  -Group instruction provided by verbal instruction and written materials to support subject.  Instructor reviews cardiac drug classes: antiplatelets, anticoagulants, beta blockers, and statins.  Instructor discusses reasons, side effects, and lifestyle considerations for each drug class.   Cardiac Drugs II:  -Group instruction provided by verbal instruction and written materials to support subject.  Instructor reviews cardiac drug classes: angiotensin converting enzyme inhibitors (ACE-I), angiotensin II receptor blockers (ARBs), nitrates, and calcium channel blockers.  Instructor discusses reasons, side effects, and lifestyle considerations for each drug class.   Anatomy and Physiology of the Circulatory System:  Group verbal and written instruction and models provide basic cardiac anatomy and physiology, with the coronary electrical and arterial systems. Review of: AMI, Angina, Valve disease, Heart Failure, Peripheral Artery Disease, Cardiac Arrhythmia, Pacemakers, and the ICD.   Other Education:  -Group or individual verbal, written, or video instructions that support the  educational goals of the cardiac rehab program.   Holiday Eating Survival Tips:  -Group instruction provided by PowerPoint slides, verbal discussion, and written materials to support subject matter. The instructor gives patients tips, tricks, and techniques to help them not only survive but enjoy the holidays despite the onslaught of food that accompanies the holidays.   Knowledge Questionnaire Score: Knowledge Questionnaire Score - 06/08/17 0741      Knowledge Questionnaire Score   Pre Score  20/24       Core Components/Risk Factors/Patient Goals at Admission: Personal Goals and Risk Factors at Admission - 06/08/17 1112      Core Components/Risk Factors/Patient Goals on Admission   Lipids  Yes    Intervention  Provide education and support for participant on nutrition & aerobic/resistive exercise along with prescribed medications to achieve LDL 70mg , HDL >40mg .    Expected Outcomes  Short Term: Participant states understanding of desired cholesterol values and is compliant with medications prescribed. Participant is following exercise prescription and nutrition guidelines.;Long Term: Cholesterol controlled with medications as prescribed, with individualized exercise RX and with personalized nutrition plan. Value goals: LDL < 70mg , HDL > 40 mg.       Core Components/Risk Factors/Patient Goals Review:  Goals and Risk Factor Review    Row Name 06/14/17 0758 07/07/17 1106 08/05/17 0747         Core Components/Risk Factors/Patient Goals Review   Personal Goals Review  Lipids  Lipids  Lipids     Review  Pt  with CAD RF and is willing to participate in CR exercises. 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   Pt with CAD RF and is eager to participate in CR exercises. Pt has great attendance thus far in the program.   Pt with little CAD RF is tolerating exercise well and increases in workloads.  Pt has continued to have great attendance.     Expected  Outcomes  Pt will participate in CR exercise, nutrition, and lifestyle modification opportunities.   Pt will participate in CR exercise, nutrition, and lifestyle modification opportunities.   Pt will participate in CR exercise, nutrition, and lifestyle modification opportunities.         Core Components/Risk Factors/Patient Goals at Discharge (Final Review):  Goals and Risk Factor Review - 08/05/17 0747      Core Components/Risk Factors/Patient Goals Review   Personal Goals Review  Lipids    Review  Pt with little CAD RF is tolerating exercise well and increases in workloads.  Pt has continued to have great attendance.    Expected Outcomes  Pt will participate in CR exercise, nutrition, and lifestyle modification opportunities.        ITP Comments: ITP Comments    Row Name 06/08/17 0739 07/07/17 1105 08/05/17 0747       ITP Comments  Medical Director- Dr. Armanda Magic, MD  30 Day ITP review.  Pt is off to a great start in the CR program.  He demonstrates eagerness to attend the program.   30 Day ITP Review. Pt has great attendance in CR Program and is eager to participate in exercise.        Comments: See ITP Comments.

## 2017-08-05 NOTE — Telephone Encounter (Signed)
I would have him go ahead and restart back in. Gradually build back up to his prior level   J BrancH MD

## 2017-08-05 NOTE — Telephone Encounter (Signed)
Per phone call from pt-- has some questions

## 2017-08-06 ENCOUNTER — Encounter (HOSPITAL_COMMUNITY)
Admission: RE | Admit: 2017-08-06 | Discharge: 2017-08-06 | Disposition: A | Discharge status: Home / Self Care | Payer: BLUE CROSS/BLUE SHIELD | Source: Ambulatory Visit | Attending: Cardiology | Admitting: Cardiology

## 2017-08-06 ENCOUNTER — Other Ambulatory Visit: Payer: Self-pay | Admitting: *Deleted

## 2017-08-06 DIAGNOSIS — Z955 Presence of coronary angioplasty implant and graft: Secondary | ICD-10-CM | POA: Diagnosis not present

## 2017-08-06 DIAGNOSIS — I214 Non-ST elevation (NSTEMI) myocardial infarction: Secondary | ICD-10-CM | POA: Diagnosis not present

## 2017-08-06 DIAGNOSIS — Z7901 Long term (current) use of anticoagulants: Secondary | ICD-10-CM | POA: Diagnosis not present

## 2017-08-06 DIAGNOSIS — E785 Hyperlipidemia, unspecified: Secondary | ICD-10-CM | POA: Diagnosis not present

## 2017-08-06 DIAGNOSIS — Z7982 Long term (current) use of aspirin: Secondary | ICD-10-CM | POA: Diagnosis not present

## 2017-08-06 DIAGNOSIS — Z79899 Other long term (current) drug therapy: Secondary | ICD-10-CM | POA: Diagnosis not present

## 2017-08-06 DIAGNOSIS — Z86718 Personal history of other venous thrombosis and embolism: Secondary | ICD-10-CM | POA: Diagnosis not present

## 2017-08-06 MED ORDER — NITROGLYCERIN 0.4 MG SL SUBL: 0.4000 mg | SUBLINGUAL_TABLET | SUBLINGUAL | 3 refills | Status: DC | PRN

## 2017-08-06 NOTE — Progress Notes (Signed)
[name]. 72 y.o. male DOB: 06-Apr-1945 MRN: [id number]      Nutrition Note  Dx: NSTEMI, s/p DES   Past Medical History:  Diagnosis Date  . Anticoagulated on Coumadin   . Avascular necrosis of hip (HCC)    LEFT  . DVT (deep venous thrombosis) (HCC) 2009  . Dyslipidemia, goal LDL below 70 04/09/2017  . Family history of anesthesia complication    BROTHER HAD MALIGNANT HYPOTHERMIA - PT HAS NOT HAD ANY PROBLEMS WITH ANESTHESIA    . History of kidney stones   . Malignant hyperthermia    PT'S BROTHER HAS HX MALIGNANT HYPERTHERMIA  . NSTEMI (non-ST elevated myocardial infarction) (HCC) 04/09/2017  . Pulmonary embolism (HCC) 2009  . Vertigo    NO RECENT PROBLEMS   Meds reviewed. Coumadin noted  Labs:  Lipid Panel     Component Value Date/Time   CHOL 87 (L) 05/20/2017 0916   TRIG 80 05/20/2017 0916   HDL 31 (L) 05/20/2017 0916   CHOLHDL 2.8 05/20/2017 0916   CHOLHDL 3.6 04/10/2017 0539   VLDL 17 04/10/2017 0539   LDLCALC 40 05/20/2017 0916    Lab Results  Component Value Date   HGBA1C 5.6 04/09/2017   CBG (last 3)  No results for input(s): GLUCAP in the last 72 hours.  Nutrition Note Spoke with pt. Nutrition plan and survey reviewed with pt. Pt is following a heart healthy diet. Per discussion, pt does not eat many fruits/veggies. Pt does not consume dairy products often. Calcium and vitamin D supplement discussed. Pt on Coumadin. Pt is following a diet with consistent vitamin K intake. Pt expressed understanding of the information reviewed. Pt previously given nutrition class handouts. Pt denies any questions re: information given.  Nutrition Diagnosis ? Food-and nutrition-related knowledge deficit related to lack of exposure to information as related to diagnosis of: ? CVD   Nutrition Intervention ? Pt's individual nutrition plan reviewed with pt. ? Benefits of adopting Heart Healthy diet discussed when Medficts reviewed.   ? Pt encouraged to consider adding a  Calcium supplement with vitamin D  Nutrition Goal(s):  ? Pt to describe the benefit of including fruits, vegetables, whole grains, and low-fat dairy products in a heart healthy meal plan.  Plan:  Pt to attend nutrition classes ? Portion Distortion  Will provide client-centered nutrition education as part of interdisciplinary care.   Monitor and evaluate progress toward nutrition goal with team.  Mickle Plumb, M.Ed, RD, LDN, CDE 08/06/2017 8:06 AM

## 2017-08-08 ENCOUNTER — Other Ambulatory Visit: Payer: Self-pay | Admitting: Family Medicine

## 2017-08-09 ENCOUNTER — Encounter (HOSPITAL_COMMUNITY)
Admission: RE | Admit: 2017-08-09 | Discharge: 2017-08-09 | Disposition: A | Discharge status: Home / Self Care | Payer: BLUE CROSS/BLUE SHIELD | Source: Ambulatory Visit | Attending: Cardiology | Admitting: Cardiology

## 2017-08-09 DIAGNOSIS — E785 Hyperlipidemia, unspecified: Secondary | ICD-10-CM | POA: Diagnosis not present

## 2017-08-09 DIAGNOSIS — Z955 Presence of coronary angioplasty implant and graft: Secondary | ICD-10-CM

## 2017-08-09 DIAGNOSIS — I214 Non-ST elevation (NSTEMI) myocardial infarction: Secondary | ICD-10-CM | POA: Diagnosis not present

## 2017-08-09 DIAGNOSIS — I213 ST elevation (STEMI) myocardial infarction of unspecified site: Secondary | ICD-10-CM

## 2017-08-09 DIAGNOSIS — Z7982 Long term (current) use of aspirin: Secondary | ICD-10-CM | POA: Diagnosis not present

## 2017-08-09 DIAGNOSIS — Z86718 Personal history of other venous thrombosis and embolism: Secondary | ICD-10-CM | POA: Diagnosis not present

## 2017-08-09 DIAGNOSIS — Z79899 Other long term (current) drug therapy: Secondary | ICD-10-CM | POA: Diagnosis not present

## 2017-08-09 DIAGNOSIS — Z7901 Long term (current) use of anticoagulants: Secondary | ICD-10-CM | POA: Diagnosis not present

## 2017-08-09 NOTE — Telephone Encounter (Signed)
Patient notified of Dr Verna Czech message to return to work gradually.pt will call for any issuses

## 2017-08-11 ENCOUNTER — Encounter (HOSPITAL_COMMUNITY)
Admission: RE | Admit: 2017-08-11 | Discharge: 2017-08-11 | Disposition: A | Discharge status: Home / Self Care | Payer: BLUE CROSS/BLUE SHIELD | Source: Ambulatory Visit | Attending: Cardiology | Admitting: Cardiology

## 2017-08-11 DIAGNOSIS — I214 Non-ST elevation (NSTEMI) myocardial infarction: Secondary | ICD-10-CM | POA: Diagnosis not present

## 2017-08-11 DIAGNOSIS — Z7982 Long term (current) use of aspirin: Secondary | ICD-10-CM | POA: Diagnosis not present

## 2017-08-11 DIAGNOSIS — Z955 Presence of coronary angioplasty implant and graft: Secondary | ICD-10-CM | POA: Diagnosis not present

## 2017-08-11 DIAGNOSIS — Z7901 Long term (current) use of anticoagulants: Secondary | ICD-10-CM | POA: Diagnosis not present

## 2017-08-11 DIAGNOSIS — Z86718 Personal history of other venous thrombosis and embolism: Secondary | ICD-10-CM | POA: Diagnosis not present

## 2017-08-11 DIAGNOSIS — E785 Hyperlipidemia, unspecified: Secondary | ICD-10-CM | POA: Diagnosis not present

## 2017-08-11 DIAGNOSIS — Z79899 Other long term (current) drug therapy: Secondary | ICD-10-CM | POA: Diagnosis not present

## 2017-08-12 ENCOUNTER — Ambulatory Visit (INDEPENDENT_AMBULATORY_CARE_PROVIDER_SITE_OTHER): Payer: BLUE CROSS/BLUE SHIELD

## 2017-08-12 DIAGNOSIS — Z7901 Long term (current) use of anticoagulants: Secondary | ICD-10-CM | POA: Diagnosis not present

## 2017-08-12 LAB — POCT INR
INR: 2.3 (ref 2.0–3.0)
INR: 2.3 (ref 2.0–3.0)

## 2017-08-12 NOTE — Patient Instructions (Signed)
Take one tablet every day except Tuesday. Return in 4 weeks for recheck

## 2017-08-13 ENCOUNTER — Encounter (HOSPITAL_COMMUNITY)
Admission: RE | Admit: 2017-08-13 | Discharge: 2017-08-13 | Disposition: A | Discharge status: Home / Self Care | Payer: BLUE CROSS/BLUE SHIELD | Source: Ambulatory Visit | Attending: Cardiology | Admitting: Cardiology

## 2017-08-13 DIAGNOSIS — I214 Non-ST elevation (NSTEMI) myocardial infarction: Secondary | ICD-10-CM | POA: Diagnosis not present

## 2017-08-13 DIAGNOSIS — Z955 Presence of coronary angioplasty implant and graft: Secondary | ICD-10-CM

## 2017-08-13 DIAGNOSIS — Z79899 Other long term (current) drug therapy: Secondary | ICD-10-CM | POA: Diagnosis not present

## 2017-08-13 DIAGNOSIS — Z7901 Long term (current) use of anticoagulants: Secondary | ICD-10-CM | POA: Diagnosis not present

## 2017-08-13 DIAGNOSIS — Z86718 Personal history of other venous thrombosis and embolism: Secondary | ICD-10-CM | POA: Diagnosis not present

## 2017-08-13 DIAGNOSIS — Z7982 Long term (current) use of aspirin: Secondary | ICD-10-CM | POA: Diagnosis not present

## 2017-08-13 DIAGNOSIS — E785 Hyperlipidemia, unspecified: Secondary | ICD-10-CM | POA: Diagnosis not present

## 2017-08-18 ENCOUNTER — Encounter (HOSPITAL_COMMUNITY)
Admission: RE | Admit: 2017-08-18 | Discharge: 2017-08-18 | Disposition: A | Discharge status: Home / Self Care | Payer: BLUE CROSS/BLUE SHIELD | Source: Ambulatory Visit | Attending: Cardiology | Admitting: Cardiology

## 2017-08-18 DIAGNOSIS — Z79899 Other long term (current) drug therapy: Secondary | ICD-10-CM | POA: Diagnosis not present

## 2017-08-18 DIAGNOSIS — E785 Hyperlipidemia, unspecified: Secondary | ICD-10-CM | POA: Diagnosis not present

## 2017-08-18 DIAGNOSIS — Z955 Presence of coronary angioplasty implant and graft: Secondary | ICD-10-CM

## 2017-08-18 DIAGNOSIS — Z7901 Long term (current) use of anticoagulants: Secondary | ICD-10-CM | POA: Diagnosis not present

## 2017-08-18 DIAGNOSIS — I214 Non-ST elevation (NSTEMI) myocardial infarction: Secondary | ICD-10-CM

## 2017-08-18 DIAGNOSIS — Z7982 Long term (current) use of aspirin: Secondary | ICD-10-CM | POA: Diagnosis not present

## 2017-08-18 DIAGNOSIS — Z86718 Personal history of other venous thrombosis and embolism: Secondary | ICD-10-CM | POA: Diagnosis not present

## 2017-08-20 ENCOUNTER — Encounter (HOSPITAL_COMMUNITY): Payer: BLUE CROSS/BLUE SHIELD

## 2017-08-23 ENCOUNTER — Encounter (HOSPITAL_COMMUNITY)
Admission: RE | Admit: 2017-08-23 | Discharge: 2017-08-23 | Disposition: A | Discharge status: Home / Self Care | Payer: BLUE CROSS/BLUE SHIELD | Source: Ambulatory Visit | Attending: Cardiology | Admitting: Cardiology

## 2017-08-23 ENCOUNTER — Ambulatory Visit (INDEPENDENT_AMBULATORY_CARE_PROVIDER_SITE_OTHER): Payer: BLUE CROSS/BLUE SHIELD | Admitting: Cardiology

## 2017-08-23 ENCOUNTER — Encounter: Payer: Self-pay | Admitting: Cardiology

## 2017-08-23 VITALS — BP 142/76 | HR 72 | Ht 67.0 in | Wt 168.9 lb

## 2017-08-23 DIAGNOSIS — Z86718 Personal history of other venous thrombosis and embolism: Secondary | ICD-10-CM | POA: Insufficient documentation

## 2017-08-23 DIAGNOSIS — E785 Hyperlipidemia, unspecified: Secondary | ICD-10-CM | POA: Insufficient documentation

## 2017-08-23 DIAGNOSIS — Z7982 Long term (current) use of aspirin: Secondary | ICD-10-CM | POA: Insufficient documentation

## 2017-08-23 DIAGNOSIS — Z86711 Personal history of pulmonary embolism: Secondary | ICD-10-CM

## 2017-08-23 DIAGNOSIS — Z7901 Long term (current) use of anticoagulants: Secondary | ICD-10-CM | POA: Diagnosis not present

## 2017-08-23 DIAGNOSIS — Z79899 Other long term (current) drug therapy: Secondary | ICD-10-CM | POA: Insufficient documentation

## 2017-08-23 DIAGNOSIS — Z955 Presence of coronary angioplasty implant and graft: Secondary | ICD-10-CM | POA: Diagnosis not present

## 2017-08-23 DIAGNOSIS — I214 Non-ST elevation (NSTEMI) myocardial infarction: Secondary | ICD-10-CM | POA: Insufficient documentation

## 2017-08-23 DIAGNOSIS — I2510 Atherosclerotic heart disease of native coronary artery without angina pectoris: Secondary | ICD-10-CM | POA: Diagnosis not present

## 2017-08-23 DIAGNOSIS — I213 ST elevation (STEMI) myocardial infarction of unspecified site: Secondary | ICD-10-CM

## 2017-08-23 MED ORDER — SILDENAFIL CITRATE 20 MG PO TABS: 40.0000 mg | ORAL_TABLET | ORAL | 6 refills | Status: DC | PRN

## 2017-08-23 MED ORDER — ATORVASTATIN CALCIUM 80 MG PO TABS: 80.0000 mg | ORAL_TABLET | Freq: Every day | ORAL | 3 refills | Status: DC

## 2017-08-23 NOTE — Patient Instructions (Addendum)
Medication Instructions:   Your physician has recommended you make the following change in your medication:   Hold atorvastatin for 2 weeks. Call office with status update.  Start sildenafil 20 mg taking 2-3 tablets 30 minutes before intercourse as needed.   Continue all other medications the same.   Labwork:  NONE  Testing/Procedures:  NONE  Follow-Up:  Your physician recommends that you schedule a follow-up appointment in: 6 months. You will receive a reminder letter in the mail in about 4 months reminding you to call and schedule your appointment. If you don't receive this letter, please contact our office.  Any Other Special Instructions Will Be Listed Below (If Applicable).  If you need a refill on your cardiac medications before your next appointment, please call your pharmacy.

## 2017-08-23 NOTE — Progress Notes (Signed)
Clinical Summary [name] is a 71 y.o.male seen today for follow up of the following medical problems.    1. CAD - admit Jan 2019 with subcascpular pain, found to have NSTEMI peak trop 31 - cath Jan 2019 staged PCI LCX and LAD.  - plans were for triple therapy with asa, plavix, coumdin (for history of PE) x 1 month, then plavix and coumadin - Jan 2019 echo LVEF 60-65%, no wma's, grade I diatolic dysfunction.    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 History of bilateral PE -  occurred after immobility after back surgery in 10/2017 - 09/2013 LE US showed chronic DVT left femoral vein - 06/2017 CT PE no PE - 06/2017 LE venous US: left popliteal vein DVT new compared to 09/2013 study. Chronic DVT left femoral and tibial veins. Chronic DVT left deep femoral vein.  - on life long coumadin from notes, notes indicate this was recommended by hematology in the past - seen again by hematology, decision made to continue anticoagulation.     Past Medical History:  Diagnosis Date  . Anticoagulated on Coumadin   . Avascular necrosis of hip (HCC)    LEFT  . DVT (deep venous thrombosis) (HCC) 2009  . Dyslipidemia, goal LDL below 70 04/09/2017  . Family history of anesthesia complication    BROTHER HAD MALIGNANT HYPOTHERMIA - PT HAS NOT HAD ANY PROBLEMS WITH ANESTHESIA    . History of kidney stones   . Malignant hyperthermia    PT'S BROTHER HAS HX MALIGNANT HYPERTHERMIA  . NSTEMI (non-ST elevated myocardial infarction) (HCC) 04/09/2017  . Pulmonary embolism (HCC) 2009  . Vertigo    NO RECENT PROBLEMS     Allergies  Allergen Reactions  . Lyrica [Pregabalin]     "went out of head"     Current Outpatient Medications  Medication Sig Dispense Refill  . acetaminophen (TYLENOL) 500 MG tablet Take 2 tablets (1,000 mg total) by mouth every 8 (eight) hours. 30 tablet 0  .  atorvastatin (LIPITOR) 80 MG tablet Take 1 tablet (80 mg total) by mouth daily at 6 PM. 90 tablet 3  . carvedilol (COREG) 6.25 MG tablet Take 1 tablet (6.25 mg total) by mouth 2 (two) times daily with a meal. 180 tablet 3  . clopidogrel (PLAVIX) 75 MG tablet Take 1 tablet (75 mg total) by mouth daily. 90 tablet 3  . lisinopril (PRINIVIL,ZESTRIL) 2.5 MG tablet Take 1 tablet (2.5 mg total) by mouth daily. 90 tablet 3  . nitroGLYCERIN (NITROSTAT) 0.4 MG SL tablet Place 1 tablet (0.4 mg total) under the tongue every 5 (five) minutes x 3 doses as needed for chest pain (If no relief after 1st dose, proceed to the ED for an evaluation). 25 tablet 3  . warfarin (COUMADIN) 5 MG tablet TAKE 1 TABLET DAILY except on Tuesday taking 2.5mg  90 tablet 3   No current facility-administered medications for this visit.      Past Surgical History:  Procedure Laterality Date  . BACK SURGERY  2009   DISCECTOMY  . CERVICAL FUSION  2008  . CORONARY STENT INTERVENTION N/A 04/09/2017   Procedure: CORONARY STENT INTERVENTION;  Surgeon: Yvonne Kendall, MD;  Location: [facility];  Service: Cardiovascular;  Laterality: N/A;  . CORONARY STENT INTERVENTION N/A 04/12/2017   Procedure: CORONARY STENT INTERVENTION;  Surgeon:  End, Cristal Deer, MD;  Location: [facility];  Service: Cardiovascular;  Laterality: N/A;  . INTRAVASCULAR ULTRASOUND/IVUS N/A 04/12/2017   Procedure: Intravascular Ultrasound/IVUS;  Surgeon: Yvonne Kendall, MD;  Location: [facility];  Service: Cardiovascular;  Laterality: N/A;  . LEFT HEART CATH AND CORONARY ANGIOGRAPHY N/A 04/09/2017   Procedure: LEFT HEART CATH AND CORONARY ANGIOGRAPHY;  Surgeon: Yvonne Kendall, MD;  Location: [facility];  Service: Cardiovascular;  Laterality: N/A;  . TOTAL HIP ARTHROPLASTY Right 12/22/2013   Procedure: RIGHT TOTAL HIP ARTHROPLASTY ANTERIOR APPROACH;  Surgeon: Shelda Pal, MD;  Location: [facility];  Service: Orthopedics;  Laterality:  Right;  . TOTAL HIP ARTHROPLASTY Left 04/30/2015   Procedure: LEFT TOTAL HIP ARTHROPLASTY ANTERIOR APPROACH;  Surgeon: Durene Romans, MD;  Location: [facility];  Service: Orthopedics;  Laterality: Left;     Allergies  Allergen Reactions  . Lyrica [Pregabalin]     "went out of head"      Family History  Problem Relation Age of Onset  . Breast cancer Mother   . Hypertension Mother   . Hyperlipidemia Mother   . Heart attack Mother 64  . Stroke Mother   . Hypertension Paternal Grandmother   . Diabetes Paternal Grandmother   . Cirrhosis Sister   . Cancer Brother      Social History [name] reports that he has never smoked. He has never used smokeless tobacco. [name] reports that he does not drink alcohol.   Review of Systems CONSTITUTIONAL: No weight loss, fever, chills, weakness or fatigue.  HEENT: Eyes: No visual loss, blurred vision, double vision or yellow sclerae.No hearing loss, sneezing, congestion, runny nose or sore throat.  SKIN: No rash or itching.  CARDIOVASCULAR: per hpi RESPIRATORY: No shortness of breath, cough or sputum.  GASTROINTESTINAL: No anorexia, nausea, vomiting or diarrhea. No abdominal pain or blood.  GENITOURINARY: No burning on urination, no polyuria NEUROLOGICAL: No headache, dizziness, syncope, paralysis, ataxia, numbness or tingling in the extremities. No change in bowel or bladder control.  MUSCULOSKELETAL: No muscle, back pain, joint pain or stiffness.  LYMPHATICS: No enlarged nodes. No history of splenectomy.  PSYCHIATRIC: No history of depression or anxiety.  ENDOCRINOLOGIC: No reports of sweating, cold or heat intolerance. No polyuria or polydipsia.  Marland Kitchen   Physical Examination Vitals:   08/23/17 1042  BP: (!) 142/76  Pulse: 72  SpO2: 95%   Vitals:   08/23/17 1042  Weight: 168 lb 14 oz (76.6 kg)  Height: 5\' 7"  (1.702 m)    Gen: resting comfortably, no acute distress HEENT: no scleral icterus, pupils equal round and reactive, no  palptable cervical adenopathy,  CV: RRR, no m/r/g, no jvd Resp: Clear to auscultation bilaterally GI: abdomen is soft, non-tender, non-distended, normal bowel sounds, no hepatosplenomegaly MSK: extremities are warm, no edema.  Skin: warm, no rash Neuro:  no focal deficits Psych: appropriate affect   Diagnostic Studies Jan 2019 cath Conclusions: 1. Significant two-vessel coronary artery disease with 80-90% mid LAD stenosis and occluded mid LCx. 2. Mild to moderate right coronary artery disease. Proximal and mid portions of the LAD and RCA are ectatic/aneurysmal. 3. Mildly to moderately reduced left ventricular contraction with mid anterolateral hypokinesis. Low normal LVEDP. 4. Successful PCI to mid LCx using Resolute Onyx 2.0 x 2.0 x 22 mm DES (post-dilated to 2.6 mm proximally) with 0% residual stenosis and TIMI-3 flow.  Recommendations: 1. Plan for staged PCI to LAD next week, as renal function allows. 2. ASA and  ticagrelor given today; recommend switching ticagrelor to clopidogrel prior to discharge. Continue warfarin, clopidogrel, and aspirin x 1 month, then discontinue aspirin and complete at least 12 months of warfarin and clopidogrel. 3. Aggressive secondary prevention.   Jan 2019 cath staged PCI Conclusions: 1. Aneurysmal LAD with sequential moderate to severe stenosis involving the proximal and mid portions of the vessel. 2. Patent stent in the mid LCx. 3. Successful IVUS-guided PCI to the mid LAD with placement of non-overlapping Resolute Onyx 3.5 x 8 mm (proximal) and Resolute Onyx 2.75 x 38 mm (distal) drug-eluting stents with 0% residual stenosis and TIMI-3 flow.  Recommendations: 1. Transition from ticagrelor to clopidogrel; will load with clopidogrel 300 mg x 1 tomorrow morning, followed by 75 mg daily thereafter. 2. Resume warfarin per pharmacy tonight. Patient and his family report that he has been bridged in the past for procedures requiring cessation of  warfarin. If there is no evidence of bleeding or vascular injury tomorrow, Lovenox bridge should be started tomorrow morning per pharmacy. 3. Continue aspirin, clopidogrel, and warfarin x 1 month. If INR therapeutic/stable, aspirin can be discontinued at that time. 4. Aggressive secondary prevention.  Jan 2019 echo Study Conclusions  - Left ventricle: The cavity size was normal. Wall thickness was normal. Systolic function was normal. The estimated ejection fraction was in the range of 60% to 65%. Wall motion was normal; there were no regional wall motion abnormalities. Doppler parameters are consistent with abnormal left ventricular relaxation (grade 1 diastolic dysfunction). The E/e&' ratio is between 8-15, suggesting indeterminate LV filling pressure. - Aortic valve: Trileaflet. Sclerosis without stenosis. There was trivial regurgitation. - Mitral valve: Mildly thickened leaflets . There was trivial regurgitation. - Left atrium: The atrium was mildly dilated. - Tricuspid valve: There was trivial regurgitation. - Pulmonary arteries: PA peak pressure: 18 mm Hg (S). - Inferior vena cava: The vessel was normal in size. The respirophasic diameter changes were in the normal range (>= 50%), consistent with normal central venous pressure.  Impressions:  - LVEF 60-65%, normal wall thickness, normal wall motion, grade 1 DD, indeterminate LV filling pressure, aortic valve sclerosis with trivial AI, trivial MR, mild LAE, trivial TR, RVSP 18 mmHg, normal IVC.     Assessment and Plan  1. CAD - no symptoms - continue current meds. Has had loose stools several months, he thinks may be medication related. We will try holding atorvastatin x 2 weeks, he will call and update [place].   2. History of PE - defer decision to continue coumadin to pcp and hematology who is following, at this time decision is to continue coumadin.   3. Erectile dysfunction - given Rx  for generic sildenafil   F/u 6 months       Antoine Poche, M.D.

## 2017-08-25 ENCOUNTER — Telehealth: Payer: Self-pay | Admitting: Family Medicine

## 2017-08-25 ENCOUNTER — Encounter (HOSPITAL_COMMUNITY)
Admission: RE | Admit: 2017-08-25 | Discharge: 2017-08-25 | Disposition: A | Discharge status: Home / Self Care | Payer: BLUE CROSS/BLUE SHIELD | Source: Ambulatory Visit | Attending: Cardiology | Admitting: Cardiology

## 2017-08-25 DIAGNOSIS — Z86718 Personal history of other venous thrombosis and embolism: Secondary | ICD-10-CM | POA: Diagnosis not present

## 2017-08-25 DIAGNOSIS — I214 Non-ST elevation (NSTEMI) myocardial infarction: Secondary | ICD-10-CM | POA: Diagnosis not present

## 2017-08-25 DIAGNOSIS — Z955 Presence of coronary angioplasty implant and graft: Secondary | ICD-10-CM

## 2017-08-25 DIAGNOSIS — Z7901 Long term (current) use of anticoagulants: Secondary | ICD-10-CM | POA: Diagnosis not present

## 2017-08-25 DIAGNOSIS — E785 Hyperlipidemia, unspecified: Secondary | ICD-10-CM | POA: Diagnosis not present

## 2017-08-25 DIAGNOSIS — Z79899 Other long term (current) drug therapy: Secondary | ICD-10-CM | POA: Diagnosis not present

## 2017-08-25 DIAGNOSIS — Z7982 Long term (current) use of aspirin: Secondary | ICD-10-CM | POA: Diagnosis not present

## 2017-08-25 DIAGNOSIS — H9190 Unspecified hearing loss, unspecified ear: Secondary | ICD-10-CM

## 2017-08-25 NOTE — Telephone Encounter (Signed)
Patient is requesting a referral due to his trouble hearing.  He said he called [facility] and they need a referral, but if Dr. Brett Canales has another office in mind he is open to going to a different office.

## 2017-08-26 ENCOUNTER — Telehealth: Payer: Self-pay | Admitting: Family Medicine

## 2017-08-26 NOTE — Telephone Encounter (Signed)
Ask brendale whomever she suggests I have no preference or idea, pt can also go thru an ent, but often will emnd up at an audiologists

## 2017-08-26 NOTE — Telephone Encounter (Signed)
Referral placed.

## 2017-08-26 NOTE — Telephone Encounter (Signed)
Let's do 

## 2017-08-26 NOTE — Telephone Encounter (Signed)
Patient is aware we have made referral.

## 2017-08-26 NOTE — Telephone Encounter (Signed)
Patient wanted to know if Dr. Brett Canales would suggest a different audiologist besides [facility]?  There first available appointment is September.

## 2017-08-27 ENCOUNTER — Encounter (HOSPITAL_COMMUNITY)
Admission: RE | Admit: 2017-08-27 | Discharge: 2017-08-27 | Disposition: A | Discharge status: Home / Self Care | Payer: BLUE CROSS/BLUE SHIELD | Source: Ambulatory Visit | Attending: Cardiology | Admitting: Cardiology

## 2017-08-27 ENCOUNTER — Encounter (HOSPITAL_COMMUNITY): Payer: Self-pay

## 2017-08-27 DIAGNOSIS — Z955 Presence of coronary angioplasty implant and graft: Secondary | ICD-10-CM

## 2017-08-27 DIAGNOSIS — Z79899 Other long term (current) drug therapy: Secondary | ICD-10-CM | POA: Diagnosis not present

## 2017-08-27 DIAGNOSIS — Z86718 Personal history of other venous thrombosis and embolism: Secondary | ICD-10-CM | POA: Diagnosis not present

## 2017-08-27 DIAGNOSIS — Z7901 Long term (current) use of anticoagulants: Secondary | ICD-10-CM | POA: Diagnosis not present

## 2017-08-27 DIAGNOSIS — I214 Non-ST elevation (NSTEMI) myocardial infarction: Secondary | ICD-10-CM | POA: Diagnosis not present

## 2017-08-27 DIAGNOSIS — Z7982 Long term (current) use of aspirin: Secondary | ICD-10-CM | POA: Diagnosis not present

## 2017-08-27 DIAGNOSIS — E785 Hyperlipidemia, unspecified: Secondary | ICD-10-CM | POA: Diagnosis not present

## 2017-08-30 ENCOUNTER — Encounter (HOSPITAL_COMMUNITY)
Admission: RE | Admit: 2017-08-30 | Discharge: 2017-08-30 | Disposition: A | Discharge status: Home / Self Care | Payer: BLUE CROSS/BLUE SHIELD | Source: Ambulatory Visit | Attending: Cardiology | Admitting: Cardiology

## 2017-08-30 DIAGNOSIS — Z7901 Long term (current) use of anticoagulants: Secondary | ICD-10-CM | POA: Diagnosis not present

## 2017-08-30 DIAGNOSIS — Z79899 Other long term (current) drug therapy: Secondary | ICD-10-CM | POA: Diagnosis not present

## 2017-08-30 DIAGNOSIS — Z955 Presence of coronary angioplasty implant and graft: Secondary | ICD-10-CM | POA: Diagnosis not present

## 2017-08-30 DIAGNOSIS — I214 Non-ST elevation (NSTEMI) myocardial infarction: Secondary | ICD-10-CM

## 2017-08-30 DIAGNOSIS — Z7982 Long term (current) use of aspirin: Secondary | ICD-10-CM | POA: Diagnosis not present

## 2017-08-30 DIAGNOSIS — E785 Hyperlipidemia, unspecified: Secondary | ICD-10-CM | POA: Diagnosis not present

## 2017-08-30 DIAGNOSIS — Z86718 Personal history of other venous thrombosis and embolism: Secondary | ICD-10-CM | POA: Diagnosis not present

## 2017-08-30 DIAGNOSIS — I213 ST elevation (STEMI) myocardial infarction of unspecified site: Secondary | ICD-10-CM

## 2017-09-01 ENCOUNTER — Encounter (HOSPITAL_COMMUNITY)
Admission: RE | Admit: 2017-09-01 | Discharge: 2017-09-01 | Disposition: A | Discharge status: Home / Self Care | Payer: BLUE CROSS/BLUE SHIELD | Source: Ambulatory Visit | Attending: Cardiology | Admitting: Cardiology

## 2017-09-01 DIAGNOSIS — I214 Non-ST elevation (NSTEMI) myocardial infarction: Secondary | ICD-10-CM | POA: Diagnosis not present

## 2017-09-01 DIAGNOSIS — Z955 Presence of coronary angioplasty implant and graft: Secondary | ICD-10-CM | POA: Diagnosis not present

## 2017-09-01 DIAGNOSIS — Z79899 Other long term (current) drug therapy: Secondary | ICD-10-CM | POA: Diagnosis not present

## 2017-09-01 DIAGNOSIS — Z86718 Personal history of other venous thrombosis and embolism: Secondary | ICD-10-CM | POA: Diagnosis not present

## 2017-09-01 DIAGNOSIS — E785 Hyperlipidemia, unspecified: Secondary | ICD-10-CM | POA: Diagnosis not present

## 2017-09-01 DIAGNOSIS — Z7982 Long term (current) use of aspirin: Secondary | ICD-10-CM | POA: Diagnosis not present

## 2017-09-01 DIAGNOSIS — Z7901 Long term (current) use of anticoagulants: Secondary | ICD-10-CM | POA: Diagnosis not present

## 2017-09-02 NOTE — Progress Notes (Signed)
Cardiac Individual Treatment Plan  Patient Details  Name: [name]. MRN: [id number] Date of Birth: [birth year] Referring Provider:     CARDIAC REHAB PHASE II ORIENTATION from 06/08/2017 in [facility] CARDIAC REHAB  Referring Provider  Donato Schultz MD      Initial Encounter Date:    CARDIAC REHAB PHASE II ORIENTATION from 06/08/2017 in [facility] CARDIAC REHAB  Date  06/08/17  Referring Provider  Donato Schultz MD      Visit Diagnosis: NSTEMI (non-ST elevated myocardial infarction) (HCC)  S/P drug eluting coronary stent placement  Patient's Home Medications on Admission:  Current Outpatient Medications:  .  acetaminophen (TYLENOL) 500 MG tablet, Take 2 tablets (1,000 mg total) by mouth every 8 (eight) hours., Disp: 30 tablet, Rfl: 0 .  atorvastatin (LIPITOR) 80 MG tablet, Take 1 tablet (80 mg total) by mouth daily at 6 PM. 08/23/17 *HOLD* for 2 weeks-call back with status, Disp: 90 tablet, Rfl: 3 .  carvedilol (COREG) 6.25 MG tablet, Take 1 tablet (6.25 mg total) by mouth 2 (two) times daily with a meal., Disp: 180 tablet, Rfl: 3 .  clopidogrel (PLAVIX) 75 MG tablet, Take 1 tablet (75 mg total) by mouth daily., Disp: 90 tablet, Rfl: 3 .  lisinopril (PRINIVIL,ZESTRIL) 2.5 MG tablet, Take 1 tablet (2.5 mg total) by mouth daily., Disp: 90 tablet, Rfl: 3 .  nitroGLYCERIN (NITROSTAT) 0.4 MG SL tablet, Place 1 tablet (0.4 mg total) under the tongue every 5 (five) minutes x 3 doses as needed for chest pain (If no relief after 1st dose, proceed to the ED for an evaluation)., Disp: 25 tablet, Rfl: 3 .  sildenafil (REVATIO) 20 MG tablet, Take 2-3 tablets (40-60 mg total) by mouth as needed. 30 minutes before intercourse, Disp: 30 tablet, Rfl: 6 .  warfarin (COUMADIN) 5 MG tablet, TAKE 1 TABLET DAILY except on Tuesday taking 2.5mg , Disp: 90 tablet, Rfl: 3  Past Medical History: Past Medical History:  Diagnosis Date  . Anticoagulated on Coumadin   .  Avascular necrosis of hip (HCC)    LEFT  . DVT (deep venous thrombosis) (HCC) 2009  . Dyslipidemia, goal LDL below 70 04/09/2017  . Family history of anesthesia complication    BROTHER HAD MALIGNANT HYPOTHERMIA - PT HAS NOT HAD ANY PROBLEMS WITH ANESTHESIA    . History of kidney stones   . Malignant hyperthermia    PT'S BROTHER HAS HX MALIGNANT HYPERTHERMIA  . NSTEMI (non-ST elevated myocardial infarction) (HCC) 04/09/2017  . Pulmonary embolism (HCC) 2009  . Vertigo    NO RECENT PROBLEMS    Tobacco Use: Social History   Tobacco Use  Smoking Status Never Smoker  Smokeless Tobacco Never Used    Labs: Recent Review Flowsheet Data    Labs for ITP Cardiac and Pulmonary Rehab Latest Ref Rng & Units 03/02/2015 03/09/2016 04/09/2017 04/10/2017 05/20/2017   Cholestrol 100 - 199 mg/dL - 213 - 086 57(Q)   LDLCALC 0 - 99 mg/dL - 469(G) - 64 40   HDL >39 mg/dL - 29(B) - 28(U) 13(K)   Trlycerides 0 - 149 mg/dL - 440(N) - 87 80   Hemoglobin A1c 4.8 - 5.6 % - - 5.6 - -   TCO2 0 - 100 mmol/L 24 - - - -      Capillary Blood Glucose: No results found for: GLUCAP   Exercise Target Goals:    Exercise Program Goal: Individual exercise prescription set using results from initial 6 min walk  test and THRR while considering  patient's activity barriers and safety.   Exercise Prescription Goal: Initial exercise prescription builds to 30-45 minutes a day of aerobic activity, 2-3 days per week.  Home exercise guidelines will be given to patient during program as part of exercise prescription that the participant will acknowledge.  Activity Barriers & Risk Stratification: Activity Barriers & Cardiac Risk Stratification - 06/08/17 1036      Activity Barriers & Cardiac Risk Stratification   Activity Barriers  Other (comment);Left Hip Replacement;Right Hip Replacement    Comments  Herniated disc. Rupture disc.    Cardiac Risk Stratification  High       6 Minute Walk: 6 Minute Walk    Row Name  06/08/17 1035         6 Minute Walk   Phase  Initial     Distance  1839 feet     Walk Time  6 minutes     # of Rest Breaks  0     MPH  3.5     METS  3.9     RPE  8     VO2 Peak  13.88     Symptoms  No     Resting HR  73 bpm     Resting BP  128/72     Resting Oxygen Saturation   98 %     Exercise Oxygen Saturation  during 6 min walk  99 %     Max Ex. HR  98 bpm     Max Ex. BP  144/70     2 Minute Post BP  122/70        Oxygen Initial Assessment:   Oxygen Re-Evaluation:   Oxygen Discharge (Final Oxygen Re-Evaluation):   Initial Exercise Prescription: Initial Exercise Prescription - 06/08/17 1100      Date of Initial Exercise RX and Referring Provider   Date  06/08/17    Referring Provider  Donato Schultz MD      Treadmill   MPH  3    Grade  1    Minutes  10    METs  3.71      Bike   Level  0.8    Minutes  10    METs  2.89      NuStep   Level  3    SPM  85    Minutes  10    METs  2.5      Prescription Details   Frequency (times per week)  3    Duration  Progress to 30 minutes of continuous aerobic without signs/symptoms of physical distress      Intensity   THRR 40-80% of Max Heartrate  60-120    Ratings of Perceived Exertion  11-13    Perceived Dyspnea  0-4      Progression   Progression  Continue to progress workloads to maintain intensity without signs/symptoms of physical distress.      Resistance Training   Training Prescription  Yes    Weight  4lbs    Reps  10-15       Perform Capillary Blood Glucose checks as needed.  Exercise Prescription Changes: Exercise Prescription Changes    Row Name 06/14/17 1000 06/23/17 1605 07/05/17 1000 07/28/17 1100 08/04/17 1600     Response to Exercise   Blood Pressure (Admit)  130/64  124/70  128/62  102/60  124/62   Blood Pressure (Exercise)  170/88  150/70  152/82  140/60  144/80  Blood Pressure (Exit)  138/72  110/64  118/70  108/62  128/60   Heart Rate (Admit)  78 bpm  73 bpm  70 bpm  71 bpm   69 bpm   Heart Rate (Exercise)  117 bpm  122 bpm  129 bpm  130 bpm  127 bpm   Heart Rate (Exit)  80 bpm  76 bpm  79 bpm  69 bpm  65 bpm   Rating of Perceived Exertion (Exercise)  11  11  12  14  14    Perceived Dyspnea (Exercise)  0  0  0  0  0   Symptoms  None  None  None  None  None   Comments  Pt oriented to exercise equipment  -  -  -  -   Duration  Progress to 30 minutes of  aerobic without signs/symptoms of physical distress  Continue with 30 min of aerobic exercise without signs/symptoms of physical distress.  Continue with 30 min of aerobic exercise without signs/symptoms of physical distress.  Continue with 30 min of aerobic exercise without signs/symptoms of physical distress.  Progress to 45 minutes of aerobic exercise without signs/symptoms of physical distress   Intensity  THRR New  THRR unchanged  THRR unchanged  THRR unchanged  THRR unchanged     Progression   Progression  Continue to progress workloads to maintain intensity without signs/symptoms of physical distress.  Continue to progress workloads to maintain intensity without signs/symptoms of physical distress.  Continue to progress workloads to maintain intensity without signs/symptoms of physical distress.  Continue to progress workloads to maintain intensity without signs/symptoms of physical distress.  Continue to progress workloads to maintain intensity without signs/symptoms of physical distress.   Average METs  2.9  4.01  4.6  5.1  5.04     Resistance Training   Training Prescription  Yes  No  Yes  No  No   Weight  4lbs  -  5lbs  -  -   Reps  10-15  -  10-15  -  -   Time  10 Minutes  -  10 Minutes  -  -     Interval Training   Interval Training  -  No  No  No  No     Treadmill   MPH  3  3  3  3   3.5   Grade  1  1  2  3  3    Minutes  10  10  10  10  10    METs  3.71  3.71  4.12  4.54  5.13     Bike   Level  0.8  1.5  1.5  1.5  1.5   Minutes  10  10  10  10  10    METs  2.9  4.62  4.69  4.66  4.69     NuStep    Level  3  4  5  6  6    SPM  85  85  85  95  95   Minutes  10  10  10  10  10    METs  3.1  3.7  5  6.1  5.3     Home Exercise Plan   Plans to continue exercise at  -  -  Home (comment) Walking & Stationary Bike  Home (comment) Walking & Stationary Bike   Home (comment) Walking & Stationary Bike    Frequency  -  -  Add 3  additional days to program exercise sessions.  Add 3 additional days to program exercise sessions.  Add 3 additional days to program exercise sessions.   Initial Home Exercises Provided  -  -  07/05/17  07/05/17  07/05/17   Row Name 08/23/17 1009 09/01/17 0734           Response to Exercise   Blood Pressure (Admit)  112/70  120/64      Blood Pressure (Exercise)  148/70  164/80      Blood Pressure (Exit)  112/70  96/62      Heart Rate (Admit)  92 bpm  65 bpm      Heart Rate (Exercise)  133 bpm  135 bpm      Heart Rate (Exit)  80 bpm  63 bpm      Rating of Perceived Exertion (Exercise)  13  15      Perceived Dyspnea (Exercise)  0  0      Symptoms  None  None      Duration  Continue with 45 min of aerobic exercise without signs/symptoms of physical distress.  Continue with 45 min of aerobic exercise without signs/symptoms of physical distress.      Intensity  THRR unchanged  THRR unchanged        Progression   Progression  Continue to progress workloads to maintain intensity without signs/symptoms of physical distress.  Continue to progress workloads to maintain intensity without signs/symptoms of physical distress.      Average METs  4.94  5.12        Resistance Training   Training Prescription  Yes  No      Weight  5lbs  -      Reps  10-15  -      Time  10 Minutes  -        Interval Training   Interval Training  No  No        Treadmill   MPH  3.5  3.5      Grade  3  3      Minutes  10  10      METs  5.13  5.13        Bike   Level  1.5  1.5      Minutes  10  10      METs  4.7  4.74        NuStep   Level  6  6      SPM  95  105      Minutes  10  10       METs  5  5.5        Home Exercise Plan   Plans to continue exercise at  Home (comment)  Home (comment) Walking & Stationary Bike       Frequency  Add 3 additional days to program exercise sessions.  Add 3 additional days to program exercise sessions.      Initial Home Exercises Provided  07/05/17  07/05/17         Exercise Comments: Exercise Comments    Row Name 06/14/17 1014 07/05/17 1000 07/05/17 1004 08/04/17 1627 09/02/17 0736   Exercise Comments  Pt oriented to exercise equipment. Pt off to a great start with exericse. Will continue to monitor and progress pt's exercise prescription.   Reviewed Home Exercise with pt. Pt is exercising 2 addtional days at home. 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 Pt is exercising 2 addtional days at home. 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 Pt is responding well to exericse workloads. Will continue to monitor and progress pt as tolerated.   Reviewed METs and Goals with pt. Pt is still responding well to workloads. Will continue to monitor and progress pt.   Reviewed METs and Goals. Pt is continuing to respond well to exercise prescription. Will continue to monitor pt.       Exercise Goals and Review: Exercise Goals    Row Name 06/08/17 1121             Exercise Goals   Increase Physical Activity  Yes       Intervention  Provide advice, education, support and counseling about physical activity/exercise needs.;Develop an individualized exercise prescription for aerobic and resistive training based on initial evaluation findings, risk stratification, comorbidities and participant's personal goals.       Expected Outcomes  Short Term: Attend rehab on a regular basis to increase amount of physical activity.;Long Term: Exercising regularly at least 3-5 days a week.;Long Term: Add in home exercise to make exercise part of routine and to increase amount of physical  activity.       Increase Strength and Stamina  Yes       Intervention  Provide advice, education, support and counseling about physical activity/exercise needs.;Develop an individualized exercise prescription for aerobic and resistive training based on initial evaluation findings, risk stratification, comorbidities and participant's personal goals.       Expected Outcomes  Short Term: Increase workloads from initial exercise prescription for resistance, speed, and METs.;Short Term: Perform resistance training exercises routinely during rehab and add in resistance training at home;Long Term: Improve cardiorespiratory fitness, muscular endurance and strength as measured by increased METs and functional capacity ( )       Able to understand and use rate of perceived exertion (RPE) scale  Yes       Intervention  Provide education and explanation on how to use RPE scale       Expected Outcomes  Short Term: Able to use RPE daily in rehab to express subjective intensity level;Long Term:  Able to use RPE to guide intensity level when exercising independently       Knowledge and understanding of Target Heart Rate Range (THRR)  Yes       Intervention  Provide education and explanation of THRR including how the numbers were predicted and where they are located for reference       Expected Outcomes  Short Term: Able to state/look up THRR;Long Term: Able to use THRR to govern intensity when exercising independently;Short Term: Able to use daily as guideline for intensity in rehab       Able to check pulse independently  Yes       Intervention  Provide education and demonstration on how to check pulse in carotid and radial arteries.;Review the importance of being able to check your own pulse for safety during independent exercise       Expected Outcomes  Short Term: Able to explain why pulse checking is important during independent exercise;Long Term: Able to check pulse independently and accurately        Understanding of Exercise Prescription  Yes       Intervention  Provide education, explanation, and written materials on patient's individual exercise prescription       Expected Outcomes  Short Term: Able to  explain program exercise prescription;Long Term: Able to explain home exercise prescription to exercise independently          Exercise Goals Re-Evaluation : Exercise Goals Re-Evaluation    Row Name 07/05/17 1004 08/04/17 1630 09/02/17 0739         Exercise Goal Re-Evaluation   Exercise Goals Review  Increase Physical Activity;Increase Strength and Stamina;Able to understand and use rate of perceived exertion (RPE) scale;Knowledge and understanding of Target Heart Rate Range (THRR);Able to check pulse independently;Understanding of Exercise Prescription  Understanding of Exercise Prescription;Increase Physical Activity  Increase Physical Activity;Understanding of Exercise Prescription     Comments  Reviewed HEP with pt. Also reviewed THRR, RPE Scale, weather conditions, NTG use, endpoints of exercise, warmup and cool down. Pt is responding well to his exercise prescription. Pt is now exercising at a level 5 on Nustep and 1.5 on Bike.   Pt has been progressing well in program. Pt is still waiting to be cleared from weight restrictions by physicians. Pt is eager to get back to normal activities. Pt is feeling stronger and feeling more like hisself.  Pt has made great progress with exercise. Pt has been cleared by physician to resume normal activities. Pt is stronger and can tolerate higher workloads, however is comfortable and resistance to workload increases. Will continue to work with pt duing last 2 sessions.      Expected Outcomes  Pt will continue to increase cardiovascular and muscular strength. Pt will continue to exercise 2 additonal days for 30 minutes. Will continue to progress pt as tolerated.   Pt will continue to improve functional capacity and strength. Pt will continue to exercise  5-6 days a week for 30-45 minutes. Will continue to monitor and progress pt.   Will continue to work with pt regarding workload increases. Pt will continue to exercise at home, increase strength and stamnia. Will continue to monitor pt.          Discharge Exercise Prescription (Final Exercise Prescription Changes): Exercise Prescription Changes - 09/01/17 0734      Response to Exercise   Blood Pressure (Admit)  120/64    Blood Pressure (Exercise)  164/80    Blood Pressure (Exit)  96/62    Heart Rate (Admit)  65 bpm    Heart Rate (Exercise)  135 bpm    Heart Rate (Exit)  63 bpm    Rating of Perceived Exertion (Exercise)  15    Perceived Dyspnea (Exercise)  0    Symptoms  None    Duration  Continue with 45 min of aerobic exercise without signs/symptoms of physical distress.    Intensity  THRR unchanged      Progression   Progression  Continue to progress workloads to maintain intensity without signs/symptoms of physical distress.    Average METs  5.12      Resistance Training   Training Prescription  No      Interval Training   Interval Training  No      Treadmill   MPH  3.5    Grade  3    Minutes  10    METs  5.13      Bike   Level  1.5    Minutes  10    METs  4.74      NuStep   Level  6    SPM  105    Minutes  10    METs  5.5      Home Exercise Plan  Plans to continue exercise at  Home (comment) Walking & Stationary Bike     Frequency  Add 3 additional days to program exercise sessions.    Initial Home Exercises Provided  07/05/17       Nutrition:  Target Goals: Understanding of nutrition guidelines, daily intake of sodium 1500mg , cholesterol 200mg , calories 30% from fat and 7% or less from saturated fats, daily to have 5 or more servings of fruits and vegetables.  Biometrics: Pre Biometrics - 06/08/17 1122      Pre Biometrics   Height  5' 7.75" (1.721 m)    Weight  175 lb 4.3 oz (79.5 kg)    Waist Circumference  36.5 inches    Hip Circumference  39  inches    Waist to Hip Ratio  0.94 %    BMI (Calculated)  26.84    Triceps Skinfold  12 mm    % Body Fat  24.8 %    Grip Strength  41.5 kg    Flexibility  8 in    Single Leg Stand  11.88 seconds        Nutrition Therapy Plan and Nutrition Goals: Nutrition Therapy & Goals - 06/08/17 0921      Nutrition Therapy   Diet  Heart Healthy    Drug/Food Interactions  Coumadin/Vit K      Personal Nutrition Goals   Nutrition Goal  Pt to describe the benefit of including fruits, vegetables, whole grains, and low-fat dairy products in a heart healthy meal plan.      Intervention Plan   Intervention  Prescribe, educate and counsel regarding individualized specific dietary modifications aiming towards targeted core components such as weight, hypertension, lipid management, diabetes, heart failure and other comorbidities.    Expected Outcomes  Short Term Goal: Understand basic principles of dietary content, such as calories, fat, sodium, cholesterol and nutrients.;Long Term Goal: Adherence to prescribed nutrition plan.       Nutrition Assessments: Nutrition Assessments - 06/08/17 0921      MEDFICTS Scores   Pre Score  24       Nutrition Goals Re-Evaluation:   Nutrition Goals Re-Evaluation:   Nutrition Goals Discharge (Final Nutrition Goals Re-Evaluation):   Psychosocial: Target Goals: Acknowledge presence or absence of significant depression and/or stress, maximize coping skills, provide positive support system. Participant is able to verbalize types and ability to use techniques and skills needed for reducing stress and depression.  Initial Review & Psychosocial Screening: Initial Psych Review & Screening - 06/08/17 1337      Initial Review   Current issues with  None Identified      Family Dynamics   Good Support System?  Yes    Comments  Pt has support from wife       Barriers   Psychosocial barriers to participate in program  There are no identifiable barriers or  psychosocial needs.      Screening Interventions   Interventions  Encouraged to exercise    Expected Outcomes  Short Term goal: Identification and review with participant of any Quality of Life or Depression concerns found by scoring the questionnaire.;Long Term goal: The participant improves quality of Life and PHQ9 Scores as seen by post scores and/or verbalization of changes       Quality of Life Scores: Quality of Life - 06/08/17 0748      Quality of Life Scores   Health/Function Pre  29.6 %    Socioeconomic Pre  30 %    Psych/Spiritual  Pre  30 %    Family Pre  30 %    GLOBAL Pre  29.83 %      Scores of 19 and below usually indicate a poorer quality of life in these areas.  A difference of  2-3 points is a clinically meaningful difference.  A difference of 2-3 points in the total score of the Quality of Life Index has been associated with significant improvement in overall quality of life, self-image, physical symptoms, and general health in studies assessing change in quality of life.  PHQ-9: Recent Review Flowsheet Data    Depression screen [facility] 2/9 06/14/2017   Decreased Interest 0   Down, Depressed, Hopeless 0   PHQ - 2 Score 0     Interpretation of Total Score  Total Score Depression Severity:  1-4 = Minimal depression, 5-9 = Mild depression, 10-14 = Moderate depression, 15-19 = Moderately severe depression, 20-27 = Severe depression   Psychosocial Evaluation and Intervention: Psychosocial Evaluation - 06/14/17 0759      Psychosocial Evaluation & Interventions   Interventions  Encouraged to exercise with the program and follow exercise prescription    Comments  No pyschosocial needs identified. No interventions needed. Pt enjoys hunting, his profession as Investment banker, corporate, and babysitting his grandchildren.    Expected Outcomes  Pt will exhibit positive outlook with good coping skills.     Continue Psychosocial Services   No Follow up required       Psychosocial  Re-Evaluation: Psychosocial Re-Evaluation    Row Name 07/07/17 1109 08/05/17 0749 08/27/17 1626         Psychosocial Re-Evaluation   Current issues with  None Identified  None Identified  None Identified     Comments  No psychosocial needs identified. No interventions necessary.   No psychosocial needs identified. No interventions necessary.   No psychosocial needs identified. No interventions necessary.      Expected Outcomes  Pt will continue to exhibit positive outlook with good coping skills.   Pt will continue to exhibit positive outlook with good coping skills.   Pt will continue to exhibit positive outlook with good coping skills.      Interventions  Encouraged to attend Cardiac Rehabilitation for the exercise  Encouraged to attend Cardiac Rehabilitation for the exercise  Encouraged to attend Cardiac Rehabilitation for the exercise     Continue Psychosocial Services   No Follow up required  No Follow up required  No Follow up required        Psychosocial Discharge (Final Psychosocial Re-Evaluation): Psychosocial Re-Evaluation - 08/27/17 1626      Psychosocial Re-Evaluation   Current issues with  None Identified    Comments  No psychosocial needs identified. No interventions necessary.     Expected Outcomes  Pt will continue to exhibit positive outlook with good coping skills.     Interventions  Encouraged to attend Cardiac Rehabilitation for the exercise    Continue Psychosocial Services   No Follow up required       Vocational Rehabilitation: Provide vocational rehab assistance to qualifying candidates.   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       Education: Education Goals: Education classes will be provided on a weekly basis, covering required topics. Participant  will state understanding/return demonstration of topics  presented.  Learning Barriers/Preferences: Learning Barriers/Preferences - 06/08/17 1034      Learning Barriers/Preferences   Learning Barriers  None    Learning Preferences  Skilled Demonstration       Education Topics: Count Your Pulse:  -Group instruction provided by verbal instruction, demonstration, patient participation and written materials to support subject.  Instructors address importance of being able to find your pulse and how to count your pulse when at home without a heart monitor.  Patients get hands on experience counting their pulse with staff help and individually.   Heart Attack, Angina, and Risk Factor Modification:  -Group instruction provided by verbal instruction, video, and written materials to support subject.  Instructors address signs and symptoms of angina and heart attacks.    Also discuss risk factors for heart disease and how to make changes to improve heart health risk factors.   Functional Fitness:  -Group instruction provided by verbal instruction, demonstration, patient participation, and written materials to support subject.  Instructors address safety measures for doing things around the house.  Discuss how to get up and down off the floor, how to pick things up properly, how to safely get out of a chair without assistance, and balance training.   Meditation and Mindfulness:  -Group instruction provided by verbal instruction, patient participation, and written materials to support subject.  Instructor addresses importance of mindfulness and meditation practice to help reduce stress and improve awareness.  Instructor also leads participants through a meditation exercise.    Stretching for Flexibility and Mobility:  -Group instruction provided by verbal instruction, patient participation, and written materials to support subject.  Instructors lead participants through series of stretches that are  designed to increase flexibility thus improving mobility.  These stretches are additional exercise for major muscle groups that are typically performed during regular warm up and cool down.   Hands Only CPR:  -Group verbal, video, and participation provides a basic overview of AHA guidelines for community CPR. Role-play of emergencies allow participants the opportunity to practice calling for help and chest compression technique with discussion of AED use.   Hypertension: -Group verbal and written instruction that provides a basic overview of hypertension including the most recent diagnostic guidelines, risk factor reduction with self-care instructions and medication management.    Nutrition I class: Heart Healthy Eating:  -Group instruction provided by PowerPoint slides, verbal discussion, and written materials to support subject matter. The instructor gives an explanation and review of the Therapeutic Lifestyle Changes diet recommendations, which includes a discussion on lipid goals, dietary fat, sodium, fiber, plant stanol/sterol esters, sugar, and the components of a well-balanced, healthy diet.   CARDIAC REHAB PHASE II ORIENTATION from 06/08/2017 in [facility] CARDIAC REHAB  Date  06/08/17  Educator  RD      Nutrition II class: Lifestyle Skills:  -Group instruction provided by PowerPoint slides, verbal discussion, and written materials to support subject matter. The instructor gives an explanation and review of label reading, grocery shopping for heart health, heart healthy recipe modifications, and ways to make healthier choices when eating out.   CARDIAC REHAB PHASE II ORIENTATION from 06/08/2017 in [facility] CARDIAC REHAB  Date  06/08/17  Educator  RD      Diabetes Question & Answer:  -Group instruction provided by PowerPoint slides, verbal discussion, and written materials to support subject matter. The instructor gives an explanation and  review of diabetes co-morbidities, pre- and post-prandial blood glucose goals, pre-exercise blood glucose goals,  signs, symptoms, and treatment of hypoglycemia and hyperglycemia, and foot care basics.   Diabetes Blitz:  -Group instruction provided by PowerPoint slides, verbal discussion, and written materials to support subject matter. The instructor gives an explanation and review of the physiology behind type 1 and type 2 diabetes, diabetes medications and rational behind using different medications, pre- and post-prandial blood glucose recommendations and Hemoglobin A1c goals, diabetes diet, and exercise including blood glucose guidelines for exercising safely.    Portion Distortion:  -Group instruction provided by PowerPoint slides, verbal discussion, written materials, and food models to support subject matter. The instructor gives an explanation of serving size versus portion size, changes in portions sizes over the last 20 years, and what consists of a serving from each food group.   Stress Management:  -Group instruction provided by verbal instruction, video, and written materials to support subject matter.  Instructors review role of stress in heart disease and how to cope with stress positively.     Exercising on Your Own:  -Group instruction provided by verbal instruction, power point, and written materials to support subject.  Instructors discuss benefits of exercise, components of exercise, frequency and intensity of exercise, and end points for exercise.  Also discuss use of nitroglycerin and activating EMS.  Review options of places to exercise outside of rehab.  Review guidelines for sex with heart disease.   Cardiac Drugs I:  -Group instruction provided by verbal instruction and written materials to support subject.  Instructor reviews cardiac drug classes: antiplatelets, anticoagulants, beta blockers, and statins.  Instructor discusses reasons, side effects, and lifestyle  considerations for each drug class.   Cardiac Drugs II:  -Group instruction provided by verbal instruction and written materials to support subject.  Instructor reviews cardiac drug classes: angiotensin converting enzyme inhibitors (ACE-I), angiotensin II receptor blockers (ARBs), nitrates, and calcium channel blockers.  Instructor discusses reasons, side effects, and lifestyle considerations for each drug class.   Anatomy and Physiology of the Circulatory System:  Group verbal and written instruction and models provide basic cardiac anatomy and physiology, with the coronary electrical and arterial systems. Review of: AMI, Angina, Valve disease, Heart Failure, Peripheral Artery Disease, Cardiac Arrhythmia, Pacemakers, and the ICD.   Other Education:  -Group or individual verbal, written, or video instructions that support the educational goals of the cardiac rehab program.   Holiday Eating Survival Tips:  -Group instruction provided by PowerPoint slides, verbal discussion, and written materials to support subject matter. The instructor gives patients tips, tricks, and techniques to help them not only survive but enjoy the holidays despite the onslaught of food that accompanies the holidays.   Knowledge Questionnaire Score: Knowledge Questionnaire Score - 06/08/17 0741      Knowledge Questionnaire Score   Pre Score  20/24       Core Components/Risk Factors/Patient Goals at Admission: Personal Goals and Risk Factors at Admission - 06/08/17 1112      Core Components/Risk Factors/Patient Goals on Admission   Lipids  Yes    Intervention  Provide education and support for participant on nutrition & aerobic/resistive exercise along with prescribed medications to achieve LDL 70mg , HDL >40mg .    Expected Outcomes  Short Term: Participant states understanding of desired cholesterol values and is compliant with medications prescribed. Participant is following exercise prescription and  nutrition guidelines.;Long Term: Cholesterol controlled with medications as prescribed, with individualized exercise RX and with personalized nutrition plan. Value goals: LDL < 70mg , HDL > 40 mg.  Core Components/Risk Factors/Patient Goals Review:  Goals and Risk Factor Review    Row Name 06/14/17 0758 07/07/17 1106 08/05/17 0747 08/27/17 1625       Core Components/Risk Factors/Patient Goals Review   Personal Goals Review  Lipids  Lipids  Lipids  Lipids    Review  Pt with CAD RF and is willing to participate in CR exercises. 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   Pt with CAD RF and is eager to participate in CR exercises. Pt has great attendance thus far in the program.   Pt with little CAD RF is tolerating exercise well and increases in workloads.  Pt has continued to have great attendance.  Pt with little CAD RF is tolerating exercise well and increases in workloads. Tremell reports he is increasing his strength.    Expected Outcomes  Pt will participate in CR exercise, nutrition, and lifestyle modification opportunities.   Pt will participate in CR exercise, nutrition, and lifestyle modification opportunities.   Pt will participate in CR exercise, nutrition, and lifestyle modification opportunities.   Pt will participate in CR exercise, nutrition, and lifestyle modification opportunities.        Core Components/Risk Factors/Patient Goals at Discharge (Final Review):  Goals and Risk Factor Review - 08/27/17 1625      Core Components/Risk Factors/Patient Goals Review   Personal Goals Review  Lipids    Review  Pt with little CAD RF is tolerating exercise well and increases in workloads. Martavious reports he is increasing his strength.    Expected Outcomes  Pt will participate in CR exercise, nutrition, and lifestyle modification opportunities.        ITP Comments: ITP Comments    Row Name 06/08/17 0739 07/07/17 1105 08/05/17 0747 08/27/17 1624      ITP Comments  Medical Director- Dr. Armanda Magic, MD  30 Day ITP review.  Pt is off to a great start in the CR program.  He demonstrates eagerness to attend the program.   30 Day ITP Review. Pt has great attendance in CR Program and is eager to participate in exercise.  30 Day ITP Review. Trevaughn continues to have excellent attendance in [employer]. He is tolerating exercise changes well and increasing his strength.       Comments: See ITP Comments.

## 2017-09-03 ENCOUNTER — Encounter (HOSPITAL_COMMUNITY)
Admission: RE | Admit: 2017-09-03 | Discharge: 2017-09-03 | Disposition: A | Discharge status: Home / Self Care | Payer: BLUE CROSS/BLUE SHIELD | Source: Ambulatory Visit | Attending: Cardiology | Admitting: Cardiology

## 2017-09-03 ENCOUNTER — Encounter (HOSPITAL_COMMUNITY): Payer: Self-pay

## 2017-09-03 DIAGNOSIS — E785 Hyperlipidemia, unspecified: Secondary | ICD-10-CM | POA: Diagnosis not present

## 2017-09-03 DIAGNOSIS — Z79899 Other long term (current) drug therapy: Secondary | ICD-10-CM | POA: Diagnosis not present

## 2017-09-03 DIAGNOSIS — I214 Non-ST elevation (NSTEMI) myocardial infarction: Secondary | ICD-10-CM

## 2017-09-03 DIAGNOSIS — Z7982 Long term (current) use of aspirin: Secondary | ICD-10-CM | POA: Diagnosis not present

## 2017-09-03 DIAGNOSIS — Z955 Presence of coronary angioplasty implant and graft: Secondary | ICD-10-CM | POA: Diagnosis not present

## 2017-09-03 DIAGNOSIS — Z7901 Long term (current) use of anticoagulants: Secondary | ICD-10-CM | POA: Diagnosis not present

## 2017-09-03 DIAGNOSIS — Z86718 Personal history of other venous thrombosis and embolism: Secondary | ICD-10-CM | POA: Diagnosis not present

## 2017-09-06 ENCOUNTER — Encounter (HOSPITAL_COMMUNITY)
Admission: RE | Admit: 2017-09-06 | Discharge: 2017-09-06 | Disposition: A | Discharge status: Home / Self Care | Payer: BLUE CROSS/BLUE SHIELD | Source: Ambulatory Visit | Attending: Cardiology | Admitting: Cardiology

## 2017-09-06 VITALS — Ht 67.75 in | Wt 164.0 lb

## 2017-09-06 DIAGNOSIS — Z86718 Personal history of other venous thrombosis and embolism: Secondary | ICD-10-CM | POA: Diagnosis not present

## 2017-09-06 DIAGNOSIS — E785 Hyperlipidemia, unspecified: Secondary | ICD-10-CM | POA: Diagnosis not present

## 2017-09-06 DIAGNOSIS — Z79899 Other long term (current) drug therapy: Secondary | ICD-10-CM | POA: Diagnosis not present

## 2017-09-06 DIAGNOSIS — Z7982 Long term (current) use of aspirin: Secondary | ICD-10-CM | POA: Diagnosis not present

## 2017-09-06 DIAGNOSIS — I214 Non-ST elevation (NSTEMI) myocardial infarction: Secondary | ICD-10-CM | POA: Diagnosis not present

## 2017-09-06 DIAGNOSIS — Z955 Presence of coronary angioplasty implant and graft: Secondary | ICD-10-CM

## 2017-09-06 DIAGNOSIS — I213 ST elevation (STEMI) myocardial infarction of unspecified site: Secondary | ICD-10-CM

## 2017-09-06 DIAGNOSIS — Z7901 Long term (current) use of anticoagulants: Secondary | ICD-10-CM | POA: Diagnosis not present

## 2017-09-07 ENCOUNTER — Encounter: Payer: Self-pay | Admitting: Family Medicine

## 2017-09-07 ENCOUNTER — Encounter (INDEPENDENT_AMBULATORY_CARE_PROVIDER_SITE_OTHER): Payer: Self-pay

## 2017-09-07 NOTE — Telephone Encounter (Signed)
Appt scheduled, mailed letter to pt & sent MyChart message

## 2017-09-08 ENCOUNTER — Telehealth: Payer: Self-pay | Admitting: Cardiology

## 2017-09-08 ENCOUNTER — Encounter (HOSPITAL_COMMUNITY): Payer: BLUE CROSS/BLUE SHIELD

## 2017-09-08 MED ORDER — CARVEDILOL 6.25 MG PO TABS: 6.2500 mg | ORAL_TABLET | Freq: Two times a day (BID) | ORAL | 3 refills | Status: DC

## 2017-09-08 MED ORDER — LISINOPRIL 2.5 MG PO TABS: 5.0000 mg | ORAL_TABLET | Freq: Every day | ORAL | 3 refills | Status: DC

## 2017-09-08 NOTE — Telephone Encounter (Signed)
Patient informed and verbalized understanding of plan. 

## 2017-09-08 NOTE — Telephone Encounter (Signed)
Pt is still having diarrhea from the atorvastatin (LIPITOR) 80 MG tablet [[id number]]  -he did stop for 2 wks but he's still having problems

## 2017-09-08 NOTE — Telephone Encounter (Signed)
Restart atorvastatin 80mg  daily. Maybe the coreg is the culprit, hold 1 week and then update [place]. While holding have him increase his lisinopril to 5mg  daily to help control his bp which may increase some off coreg. Update [place] 1 week   Dominga Ferry MD

## 2017-09-09 ENCOUNTER — Ambulatory Visit (INDEPENDENT_AMBULATORY_CARE_PROVIDER_SITE_OTHER): Payer: BLUE CROSS/BLUE SHIELD

## 2017-09-09 DIAGNOSIS — Z7901 Long term (current) use of anticoagulants: Secondary | ICD-10-CM | POA: Diagnosis not present

## 2017-09-09 LAB — POCT INR: INR: 1.8 — AB (ref 2.0–3.0)

## 2017-09-09 NOTE — Patient Instructions (Signed)
Take one tablet daily 

## 2017-09-10 ENCOUNTER — Encounter (HOSPITAL_COMMUNITY): Payer: BLUE CROSS/BLUE SHIELD

## 2017-09-13 ENCOUNTER — Encounter (HOSPITAL_COMMUNITY): Payer: BLUE CROSS/BLUE SHIELD

## 2017-09-14 ENCOUNTER — Telehealth: Payer: Self-pay | Admitting: Cardiology

## 2017-09-14 DIAGNOSIS — R197 Diarrhea, unspecified: Secondary | ICD-10-CM

## 2017-09-14 NOTE — Telephone Encounter (Signed)
We had most recently planned to hold the coreg, did he stop that? If so ok to hold both coreg and atorvastatin, if symptoms continue we are going to need to refer him to GI. Since it takes some time to get those appointments I would suggest we go ahead and place the referral for diarrhea if he is willing, if resolves can always cancel appt   J Danasia Baker MD

## 2017-09-14 NOTE — Telephone Encounter (Signed)
Patient agreeable. Referral placed to GI

## 2017-09-14 NOTE — Telephone Encounter (Signed)
Patient was told to call office to report how medication change has been working.  / tg

## 2017-09-14 NOTE — Telephone Encounter (Signed)
Patient states he cannot tolerate the atorvastatin. He is having severe stomach cramps and diarrhea multiple times daily. Will forward to provider for further recommendation.

## 2017-09-15 ENCOUNTER — Encounter (HOSPITAL_COMMUNITY): Payer: BLUE CROSS/BLUE SHIELD

## 2017-09-16 ENCOUNTER — Telehealth: Payer: Self-pay | Admitting: Cardiology

## 2017-09-16 NOTE — Progress Notes (Signed)
Discharge Progress Report  Patient Details  Name: [name]. MRN: [id number] Date of Birth: 1946/02/28 Referring Provider:     CARDIAC REHAB PHASE II ORIENTATION from 06/08/2017 in [facility]  Referring Provider  Candee Furbish MD       Number of Visits: 35  Reason for Discharge:  Patient reached a stable level of exercise. Patient independent in their exercise. Patient has met program and personal goals.  Smoking History:  Social History   Tobacco Use  Smoking Status Never Smoker  Smokeless Tobacco Never Used    Diagnosis:  S/P drug eluting coronary stent placement  NSTEMI (non-ST elevated myocardial infarction) ([place])  ST elevation myocardial infarction (STEMI), unspecified artery ([place])  ADL [facility]:   Initial Exercise Prescription: Initial Exercise Prescription - 06/08/17 1100      Date of Initial Exercise RX and Referring Provider   Date  06/08/17    Referring Provider  Candee Furbish MD      Treadmill   MPH  3    Grade  1    Minutes  10    METs  3.71      Bike   Level  0.8    Minutes  10    METs  2.89      NuStep   Level  3    SPM  85    Minutes  10    METs  2.5      Prescription Details   Frequency (times per week)  3    Duration  Progress to 30 minutes of continuous aerobic without signs/symptoms of physical distress      Intensity   THRR 40-80% of Max Heartrate  60-120    Ratings of Perceived Exertion  11-13    Perceived Dyspnea  0-4      Progression   Progression  Continue to progress workloads to maintain intensity without signs/symptoms of physical distress.      Resistance Training   Training Prescription  Yes    Weight  4lbs    Reps  10-15       Discharge Exercise Prescription (Final Exercise Prescription Changes): Exercise Prescription Changes - 09/06/17 1031      Response to Exercise   Blood Pressure (Admit)  110/60    Blood Pressure (Exercise)  150/70    Blood Pressure (Exit)  102/62     Heart Rate (Admit)  66 bpm    Heart Rate (Exercise)  132 bpm    Heart Rate (Exit)  76 bpm    Rating of Perceived Exertion (Exercise)  15    Perceived Dyspnea (Exercise)  0    Symptoms  None    Duration  Continue with 45 min of aerobic exercise without signs/symptoms of physical distress.    Intensity  THRR unchanged      Progression   Progression  Continue to progress workloads to maintain intensity without signs/symptoms of physical distress.    Average METs  5.21      Resistance Training   Training Prescription  Yes    Weight  5lbs    Reps  10-15    Time  10 Minutes      Interval Training   Interval Training  No      Treadmill   MPH  3.5    Grade  3    Minutes  10    METs  5.13      Bike   Level  1.5  Minutes  10    METs  4.81      NuStep   Level  6    SPM  105    Minutes  10    METs  5.7      Home Exercise Plan   Plans to continue exercise at  Home (comment)    Frequency  Add 3 additional days to program exercise sessions.    Initial Home Exercises Provided  07/05/17       Functional Capacity: 6 Minute Walk    Row Name 06/08/17 1035 09/13/17 1036       6 Minute Walk   Phase  Initial  Discharge    Distance  1839 feet  2220 feet    Distance % Change  -  20.72 %    Distance Feet Change  -  381 ft    Walk Time  6 minutes  6 minutes    # of Rest Breaks  0  0    MPH  3.5  4.2    METS  3.9  4.36    RPE  8  12    Perceived Dyspnea   -  0    VO2 Peak  13.88  15.27    Symptoms  No  No    Resting HR  73 bpm  63 bpm    Resting BP  128/72  120/70    Resting Oxygen Saturation   98 %  -    Exercise Oxygen Saturation  during 6 min walk  99 %  -    Max Ex. HR  98 bpm  127 bpm    Max Ex. BP  144/70  140/62    2 Minute Post BP  122/70  104/62       Psychological, QOL, Others - Outcomes: PHQ 2/9: Depression screen [facility] 2/9 09/03/2017 06/14/2017  Decreased Interest 0 0  Down, Depressed, Hopeless 0 0  PHQ - 2 Score 0 0    Quality of Life: Quality of  Life - 09/03/17 0818      Quality of Life Scores   Health/Function Pre  29.6 %    Health/Function Post  29.1 %    Health/Function % Change  -1.69 %    Socioeconomic Pre  30 %    Socioeconomic Post  30 %    Socioeconomic % Change   0 %    Psych/Spiritual Pre  30 %    Psych/Spiritual Post  30 %    Psych/Spiritual % Change  0 %    Family Pre  30 %    Family Post  30 %    Family % Change  0 %    GLOBAL Pre  29.83 %    GLOBAL Post  29.61 %    GLOBAL % Change  -0.74 %       Personal Goals: Goals established at orientation with interventions provided to work toward goal. Personal Goals and Risk Factors at Admission - 06/08/17 1112      Core Components/Risk Factors/Patient Goals on Admission   Lipids  Yes    Intervention  Provide education and support for participant on nutrition & aerobic/resistive exercise along with prescribed medications to achieve LDL <48m, HDL >424m    Expected Outcomes  Short Term: Participant states understanding of desired cholesterol values and is compliant with medications prescribed. Participant is following exercise prescription and nutrition guidelines.;Long Term: Cholesterol controlled with medications as prescribed, with individualized exercise RX and with personalized nutrition plan.  Value goals: LDL < 66m, HDL > 40 mg.        Personal Goals Discharge: Goals and Risk Factor Review    Row Name 06/14/17 0758 07/07/17 1106 08/05/17 0747 08/27/17 1625 09/03/17 0853     Core Components/Risk Factors/Patient Goals Review   Personal Goals Review  Lipids  Lipids  Lipids  Lipids  Lipids   Review  Pt with CAD RF and is willing to participate in CR exercises. 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   Pt with CAD RF and is eager to participate in CR exercises. Pt has great attendance thus far in the program.   Pt with little CAD RF is tolerating exercise well and increases in workloads.  Pt has continued to have great attendance.   Pt with little CAD RF is tolerating exercise well and increases in workloads. GPrithvireports he is increasing his strength.  GHaneefhas completed CR Program.  He tolerated exercise well and felt that he learned a lot during the program.  He appreciated the staff during this time.   Expected Outcomes  Pt will participate in CR exercise, nutrition, and lifestyle modification opportunities.   Pt will participate in CR exercise, nutrition, and lifestyle modification opportunities.   Pt will participate in CR exercise, nutrition, and lifestyle modification opportunities.   Pt will participate in CR exercise, nutrition, and lifestyle modification opportunities.   GKalyanwill conitnue to participate in exercise, nutrition, and lifestlye modification opportunities to continue to reduce his overall risk.      Exercise Goals and Review: Exercise Goals    Row Name 06/08/17 1121             Exercise Goals   Increase Physical Activity  Yes       Intervention  Provide advice, education, support and counseling about physical activity/exercise needs.;Develop an individualized exercise prescription for aerobic and resistive training based on initial evaluation findings, risk stratification, comorbidities and participant's personal goals.       Expected Outcomes  Short Term: Attend rehab on a regular basis to increase amount of physical activity.;Long Term: Exercising regularly at least 3-5 days a week.;Long Term: Add in home exercise to make exercise part of routine and to increase amount of physical activity.       Increase Strength and Stamina  Yes       Intervention  Provide advice, education, support and counseling about physical activity/exercise needs.;Develop an individualized exercise prescription for aerobic and resistive training based on initial evaluation findings, risk stratification, comorbidities and participant's personal goals.       Expected Outcomes  Short Term: Increase workloads from initial  exercise prescription for resistance, speed, and METs.;Short Term: Perform resistance training exercises routinely during rehab and add in resistance training at home;Long Term: Improve cardiorespiratory fitness, muscular endurance and strength as measured by increased METs and functional capacity (6MWT)       Able to understand and use rate of perceived exertion (RPE) scale  Yes       Intervention  Provide education and explanation on how to use RPE scale       Expected Outcomes  Short Term: Able to use RPE daily in rehab to express subjective intensity level;Long Term:  Able to use RPE to guide intensity level when exercising independently       Knowledge and understanding of Target Heart Rate Range (THRR)  Yes       Intervention  Provide education  and explanation of THRR including how the numbers were predicted and where they are located for reference       Expected Outcomes  Short Term: Able to state/look up THRR;Long Term: Able to use THRR to govern intensity when exercising independently;Short Term: Able to use daily as guideline for intensity in rehab       Able to check pulse independently  Yes       Intervention  Provide education and demonstration on how to check pulse in carotid and radial arteries.;Review the importance of being able to check your own pulse for safety during independent exercise       Expected Outcomes  Short Term: Able to explain why pulse checking is important during independent exercise;Long Term: Able to check pulse independently and accurately       Understanding of Exercise Prescription  Yes       Intervention  Provide education, explanation, and written materials on patient's individual exercise prescription       Expected Outcomes  Short Term: Able to explain program exercise prescription;Long Term: Able to explain home exercise prescription to exercise independently          Nutrition & Weight - Outcomes: Pre Biometrics - 09/13/17 1038      Pre Biometrics    Height  5' 7.75" (1.721 m)    Weight  164 lb 0.4 oz (74.4 kg)    Waist Circumference  37 inches    Hip Circumference  38 inches    Waist to Hip Ratio  0.97 %    BMI (Calculated)  25.12    Triceps Skinfold  11 mm    % Body Fat  24 %    Grip Strength  42 kg    Flexibility  12 in    Single Leg Stand  13.86 seconds        Nutrition: Nutrition Therapy & Goals - 06/08/17 0921      Nutrition Therapy   Diet  Heart Healthy    Drug/Food Interactions  Coumadin/Vit K      Personal Nutrition Goals   Nutrition Goal  Pt to describe the benefit of including fruits, vegetables, whole grains, and low-fat dairy products in a heart healthy meal plan.      Intervention Plan   Intervention  Prescribe, educate and counsel regarding individualized specific dietary modifications aiming towards targeted core components such as weight, hypertension, lipid management, diabetes, heart failure and other comorbidities.    Expected Outcomes  Short Term Goal: Understand basic principles of dietary content, such as calories, fat, sodium, cholesterol and nutrients.;Long Term Goal: Adherence to prescribed nutrition plan.       Nutrition Discharge: Nutrition Assessments - 09/15/17 1037      MEDFICTS Scores   Pre Score  24    Post Score  54    Score Difference  30       Education Questionnaire Score: Knowledge Questionnaire Score - 09/03/17 0819      Knowledge Questionnaire Score   Pre Score  20/24    Post Score  24/24       Goals reviewed with patient; copy given to patient.

## 2017-09-16 NOTE — Telephone Encounter (Signed)
Patient needs RX for new dosage of Lisinopril sent to [facility] / tg

## 2017-09-17 MED ORDER — LISINOPRIL 5 MG PO TABS: 5.0000 mg | ORAL_TABLET | Freq: Every day | ORAL | 11 refills | Status: DC

## 2017-09-17 NOTE — Telephone Encounter (Signed)
Pt states he was told to stay off atorvastatin and coreg because of diarrhea.He says he is supposed to be on lisinopril 5 mg now. Can you confirm.Says he is going on vacation next week and is running out of lisinopril

## 2017-09-17 NOTE — Telephone Encounter (Signed)
Yes, stay off coreg and atorvastatin. Should be on lisinopril 5mg  daily. Update [place] in about 2 weeks on his diarrhea, we have also referred him to GI, he should be contacted with an appt soon   Dominga Ferry MD

## 2017-09-17 NOTE — Telephone Encounter (Signed)
Pt notified, refilled lisinopril 5 mg qd

## 2017-09-27 ENCOUNTER — Telehealth: Payer: Self-pay | Admitting: Cardiology

## 2017-09-27 NOTE — Telephone Encounter (Signed)
Pt called stating his diarrhea has cleared up since stopping the medication

## 2017-09-28 DIAGNOSIS — H903 Sensorineural hearing loss, bilateral: Secondary | ICD-10-CM | POA: Diagnosis not present

## 2017-09-28 DIAGNOSIS — H9193 Unspecified hearing loss, bilateral: Secondary | ICD-10-CM | POA: Diagnosis not present

## 2017-09-28 NOTE — Telephone Encounter (Signed)
Will forward to Dr. Branch as an FYI: 

## 2017-09-28 NOTE — Telephone Encounter (Signed)
Pt notified. Voiced understanding.

## 2017-09-28 NOTE — Telephone Encounter (Signed)
Good to hear, we will stay off those meds at this time. May consider an alternative at some point but not now.  Dominga Ferry MD

## 2017-09-29 ENCOUNTER — Ambulatory Visit (INDEPENDENT_AMBULATORY_CARE_PROVIDER_SITE_OTHER): Payer: BLUE CROSS/BLUE SHIELD | Admitting: Internal Medicine

## 2017-10-08 ENCOUNTER — Ambulatory Visit (INDEPENDENT_AMBULATORY_CARE_PROVIDER_SITE_OTHER): Payer: BLUE CROSS/BLUE SHIELD

## 2017-10-08 DIAGNOSIS — Z7901 Long term (current) use of anticoagulants: Secondary | ICD-10-CM | POA: Diagnosis not present

## 2017-10-08 LAB — POCT INR: INR: 2.9 (ref 2.0–3.0)

## 2017-10-08 NOTE — Patient Instructions (Signed)
Keep as is per Dr.Scott  Mg daily repeat 4 weeks.

## 2017-10-28 ENCOUNTER — Telehealth: Payer: Self-pay | Admitting: Cardiology

## 2017-10-28 NOTE — Telephone Encounter (Signed)
Patient has questions regarding medications. / tg  °

## 2017-10-28 NOTE — Telephone Encounter (Signed)
Spoke with pt that reports his diarrhea has stopped after stopping Coreg and Lipitor 2 weeks ago. Pt reports that he canceled his appt with GI d/t diarrhea stopping.  Pt received a call from pharmacy stating that ASA 81 mg has been called in for him. This was done by a provider not in our office. Please advise.

## 2017-10-29 NOTE — Telephone Encounter (Signed)
I spoke with patient, he is not taking aspirin, f/u apt made for 12/29/17 at 10 am with dr.branch

## 2017-10-29 NOTE — Telephone Encounter (Signed)
I don't recommend aspirin since he is on coumadin and plavix at this time. Continue to stay off both coreg and lipitor at this time, but likely in the future we will need to consider retrying one of these or a similar alternative as they are beneficial for the heart. Can he see me back in 2 months to readdress these meds   Dina Rich MD

## 2017-11-05 ENCOUNTER — Ambulatory Visit (INDEPENDENT_AMBULATORY_CARE_PROVIDER_SITE_OTHER): Payer: BLUE CROSS/BLUE SHIELD

## 2017-11-05 DIAGNOSIS — Z7901 Long term (current) use of anticoagulants: Secondary | ICD-10-CM

## 2017-11-05 LAB — POCT INR: INR: 1.9 — AB (ref 2.0–3.0)

## 2017-11-05 NOTE — Patient Instructions (Signed)
Keep the same follow up for a nurse visit INR 4 weeks.

## 2017-11-15 DIAGNOSIS — H903 Sensorineural hearing loss, bilateral: Secondary | ICD-10-CM | POA: Diagnosis not present

## 2017-12-03 ENCOUNTER — Ambulatory Visit (INDEPENDENT_AMBULATORY_CARE_PROVIDER_SITE_OTHER): Payer: BLUE CROSS/BLUE SHIELD

## 2017-12-03 ENCOUNTER — Ambulatory Visit: Payer: BLUE CROSS/BLUE SHIELD

## 2017-12-03 DIAGNOSIS — Z7901 Long term (current) use of anticoagulants: Secondary | ICD-10-CM

## 2017-12-03 LAB — POCT INR: INR: 2.1 (ref 2.0–3.0)

## 2017-12-03 NOTE — Patient Instructions (Signed)
Return 4 weeks for a recheck on pt/inr. Keep dose same.

## 2017-12-07 ENCOUNTER — Telehealth: Payer: Self-pay | Admitting: Family Medicine

## 2017-12-07 NOTE — Telephone Encounter (Signed)
The pharmacy is changing from [place]far[place] to [place]. The manufacturer has changed and pt would like to know from Dr. Brett Canales that it is ok for him to take.

## 2017-12-07 NOTE — Telephone Encounter (Signed)
Please advise 

## 2017-12-08 NOTE — Telephone Encounter (Signed)
Left message to return call 

## 2017-12-08 NOTE — Telephone Encounter (Signed)
Different manufacturer should be identical in activity,

## 2017-12-08 NOTE — Telephone Encounter (Signed)
Pt returned call. Pt stated he wanted to make sure that it was OK with provider. Nurse informed patient that provider said should be identical in activity. Pt verbalized understanding.

## 2017-12-20 DIAGNOSIS — H903 Sensorineural hearing loss, bilateral: Secondary | ICD-10-CM | POA: Diagnosis not present

## 2017-12-29 ENCOUNTER — Encounter: Payer: Self-pay | Admitting: Cardiology

## 2017-12-29 ENCOUNTER — Ambulatory Visit (INDEPENDENT_AMBULATORY_CARE_PROVIDER_SITE_OTHER): Payer: BLUE CROSS/BLUE SHIELD | Admitting: Cardiology

## 2017-12-29 VITALS — BP 126/84 | HR 66 | Ht 68.0 in | Wt 167.0 lb

## 2017-12-29 DIAGNOSIS — I2510 Atherosclerotic heart disease of native coronary artery without angina pectoris: Secondary | ICD-10-CM

## 2017-12-29 DIAGNOSIS — Z79899 Other long term (current) drug therapy: Secondary | ICD-10-CM | POA: Diagnosis not present

## 2017-12-29 MED ORDER — PRAVASTATIN SODIUM 20 MG PO TABS: 20.0000 mg | ORAL_TABLET | Freq: Every evening | ORAL | 3 refills | Status: DC

## 2017-12-29 NOTE — Patient Instructions (Signed)
Medication Instructions:  START PRAVASTATIN 20 MG DAILY   Labwork: NONE  Testing/Procedures: NONE  Follow-Up: Your physician recommends that you schedule a follow-up appointment in: February 2020    Any Other Special Instructions Will Be Listed Below (If Applicable).     If you need a refill on your cardiac medications before your next appointment, please call your pharmacy.

## 2017-12-29 NOTE — Progress Notes (Signed)
Clinical Summary [name] is a 72 y.o.male seen today for follow up of the following medical problems.   1. CAD - admit Jan 2019 with subcascpular pain, found to have NSTEMI peak trop 31 - cath Jan 2019 staged PCI LCX and LAD.  - plans were for triple therapy withasa, plavix, coumdin (for history of PE) x 1 month, then plavix and coumadin - Jan 2019 echo LVEF 60-65%, no wma's, grade I diatolic dysfunction.  - tried holding atorvastatin x 2 weeks due to diarrhea without improvement. He was to restart atorva and then hold coreg but ended up stopping both. Symptoms resolved.   - completed cardiac rehab. Walks 2 miles 5 days a week - compliant with meds.   2. History of bilateral PE - occurred after immobility after back surgery in 10/2017 - 09/2013 LE US showed chronic DVT left femoral vein - 06/2017 CT PE no PE - 06/2017 LE venous US: left popliteal vein DVT new compared to 09/2013 study. Chronic DVT left femoral and tibial veins. Chronic DVT left deep femoral vein.  - on life long coumadin from notes, notes indicate this was recommended by hematology in the past -seen again by hematology, decision made to continue anticoagulation.   Past Medical History:  Diagnosis Date  . Anticoagulated on Coumadin   . Avascular necrosis of hip (HCC)    LEFT  . DVT (deep venous thrombosis) (HCC) 2009  . Dyslipidemia, goal LDL below 70 04/09/2017  . Family history of anesthesia complication    BROTHER HAD MALIGNANT HYPOTHERMIA - PT HAS NOT HAD ANY PROBLEMS WITH ANESTHESIA    . History of kidney stones   . Malignant hyperthermia    PT'S BROTHER HAS HX MALIGNANT HYPERTHERMIA  . NSTEMI (non-ST elevated myocardial infarction) (HCC) 04/09/2017  . Pulmonary embolism (HCC) 2009  . Vertigo    NO RECENT PROBLEMS     Allergies  Allergen Reactions  . Lyrica [Pregabalin]     "went out of head"     Current Outpatient Medications  Medication Sig Dispense Refill  . acetaminophen  (TYLENOL) 500 MG tablet Take 2 tablets (1,000 mg total) by mouth every 8 (eight) hours. 30 tablet 0  . clopidogrel (PLAVIX) 75 MG tablet Take 1 tablet (75 mg total) by mouth daily. 90 tablet 3  . lisinopril (PRINIVIL,ZESTRIL) 5 MG tablet Take 1 tablet (5 mg total) by mouth daily. 30 tablet 11  . nitroGLYCERIN (NITROSTAT) 0.4 MG SL tablet Place 1 tablet (0.4 mg total) under the tongue every 5 (five) minutes x 3 doses as needed for chest pain (If no relief after 1st dose, proceed to the ED for an evaluation). 25 tablet 3  . sildenafil (REVATIO) 20 MG tablet Take 2-3 tablets (40-60 mg total) by mouth as needed. 30 minutes before intercourse 30 tablet 6  . warfarin (COUMADIN) 5 MG tablet TAKE 1 TABLET DAILY except on Tuesday taking 2.5mg  90 tablet 3   No current facility-administered medications for this visit.      Past Surgical History:  Procedure Laterality Date  . BACK SURGERY  2009   DISCECTOMY  . CERVICAL FUSION  2008  . CORONARY STENT INTERVENTION N/A 04/09/2017   Procedure: CORONARY STENT INTERVENTION;  Surgeon: Steven Kendall, MD;  Location: [facility];  Service: Cardiovascular;  Laterality: N/A;  . CORONARY STENT INTERVENTION N/A 04/12/2017   Procedure: CORONARY STENT INTERVENTION;  Surgeon: Steven Kendall, MD;  Location: [facility];  Service: Cardiovascular;  Laterality: N/A;  .  INTRAVASCULAR ULTRASOUND/IVUS N/A 04/12/2017   Procedure: Intravascular Ultrasound/IVUS;  Surgeon: Steven Kendall, MD;  Location: [facility];  Service: Cardiovascular;  Laterality: N/A;  . LEFT HEART CATH AND CORONARY ANGIOGRAPHY N/A 04/09/2017   Procedure: LEFT HEART CATH AND CORONARY ANGIOGRAPHY;  Surgeon: Steven Kendall, MD;  Location: [facility];  Service: Cardiovascular;  Laterality: N/A;  . TOTAL HIP ARTHROPLASTY Right 12/22/2013   Procedure: RIGHT TOTAL HIP ARTHROPLASTY ANTERIOR APPROACH;  Surgeon: Steven Pal, MD;  Location: [facility];  Service: Orthopedics;   Laterality: Right;  . TOTAL HIP ARTHROPLASTY Left 04/30/2015   Procedure: LEFT TOTAL HIP ARTHROPLASTY ANTERIOR APPROACH;  Surgeon: Steven Romans, MD;  Location: [facility];  Service: Orthopedics;  Laterality: Left;     Allergies  Allergen Reactions  . Lyrica [Pregabalin]     "went out of head"      Family History  Problem Relation Age of Onset  . Breast cancer Mother   . Hypertension Mother   . Hyperlipidemia Mother   . Heart attack Mother 60  . Stroke Mother   . Hypertension Paternal Grandmother   . Diabetes Paternal Grandmother   . Cirrhosis Sister   . Cancer Brother      Social History [name] reports that he has never smoked. He has never used smokeless tobacco. [name] reports that he does not drink alcohol.   Review of Systems CONSTITUTIONAL: No weight loss, fever, chills, weakness or fatigue.  HEENT: Eyes: No visual loss, blurred vision, double vision or yellow sclerae.No hearing loss, sneezing, congestion, runny nose or sore throat.  SKIN: No rash or itching.  CARDIOVASCULAR: per hpi RESPIRATORY: No shortness of breath, cough or sputum.  GASTROINTESTINAL: No anorexia, nausea, vomiting or diarrhea. No abdominal pain or blood.  GENITOURINARY: No burning on urination, no polyuria NEUROLOGICAL: No headache, dizziness, syncope, paralysis, ataxia, numbness or tingling in the extremities. No change in bowel or bladder control.  MUSCULOSKELETAL: No muscle, back pain, joint pain or stiffness.  LYMPHATICS: No enlarged nodes. No history of splenectomy.  PSYCHIATRIC: No history of depression or anxiety.  ENDOCRINOLOGIC: No reports of sweating, cold or heat intolerance. No polyuria or polydipsia.  Marland Kitchen   Physical Examination Vitals:   12/29/17 1016  BP: 126/84  Pulse: 66  SpO2: 98%   Vitals:   12/29/17 1016  Weight: 167 lb (75.8 kg)  Height: 5\' 8"  (1.727 m)    Gen: resting comfortably, no acute distress HEENT: no scleral icterus, pupils equal round and reactive,  no palptable cervical adenopathy,  CV: RRR, no m/r/g, no jvd Resp: Clear to auscultation bilaterally GI: abdomen is soft, non-tender, non-distended, normal bowel sounds, no hepatosplenomegaly MSK: extremities are warm, no edema.  Skin: warm, no rash Neuro:  no focal deficits Psych: appropriate affect   Diagnostic Studies  Jan 2019 cath Conclusions: 1. Significant two-vessel coronary artery disease with 80-90% mid LAD stenosis and occluded mid LCx. 2. Mild to moderate right coronary artery disease. Proximal and mid portions of the LAD and RCA are ectatic/aneurysmal. 3. Mildly to moderately reduced left ventricular contraction with mid anterolateral hypokinesis. Low normal LVEDP. 4. Successful PCI to mid LCx using Resolute Onyx 2.0 x 2.0 x 22 mm DES (post-dilated to 2.6 mm proximally) with 0% residual stenosis and TIMI-3 flow.  Recommendations: 1. Plan for staged PCI to LAD next week, as renal function allows. 2. ASA and ticagrelor given today; recommend switching ticagrelor to clopidogrel prior to discharge. Continue warfarin, clopidogrel, and aspirin x 1 month,  then discontinue aspirin and complete at least 12 months of warfarin and clopidogrel. 3. Aggressive secondary prevention.   Jan 2019 cath staged PCI Conclusions: 1. Aneurysmal LAD with sequential moderate to severe stenosis involving the proximal and mid portions of the vessel. 2. Patent stent in the mid LCx. 3. Successful IVUS-guided PCI to the mid LAD with placement of non-overlapping Resolute Onyx 3.5 x 8 mm (proximal) and Resolute Onyx 2.75 x 38 mm (distal) drug-eluting stents with 0% residual stenosis and TIMI-3 flow.  Recommendations: 1. Transition from ticagrelor to clopidogrel; will load with clopidogrel 300 mg x 1 tomorrow morning, followed by 75 mg daily thereafter. 2. Resume warfarin per pharmacy tonight. Patient and his family report that he has been bridged in the past for procedures requiring cessation of  warfarin. If there is no evidence of bleeding or vascular injury tomorrow, Lovenox bridge should be started tomorrow morning per pharmacy. 3. Continue aspirin, clopidogrel, and warfarin x 1 month. If INR therapeutic/stable, aspirin can be discontinued at that time. 4. Aggressive secondary prevention.  Jan 2019 echo Study Conclusions  - Left ventricle: The cavity size was normal. Wall thickness was normal. Systolic function was normal. The estimated ejection fraction was in the range of 60% to 65%. Wall motion was normal; there were no regional wall motion abnormalities. Doppler parameters are consistent with abnormal left ventricular relaxation (grade 1 diastolic dysfunction). The E/e&' ratio is between 8-15, suggesting indeterminate LV filling pressure. - Aortic valve: Trileaflet. Sclerosis without stenosis. There was trivial regurgitation. - Mitral valve: Mildly thickened leaflets . There was trivial regurgitation. - Left atrium: The atrium was mildly dilated. - Tricuspid valve: There was trivial regurgitation. - Pulmonary arteries: PA peak pressure: 18 mm Hg (S). - Inferior vena cava: The vessel was normal in size. The respirophasic diameter changes were in the normal range (>= 50%), consistent with normal central venous pressure.  Impressions:  - LVEF 60-65%, normal wall thickness, normal wall motion, grade 1 DD, indeterminate LV filling pressure, aortic valve sclerosis with trivial AI, trivial MR, mild LAE, trivial TR, RVSP 18 mmHg, normal IVC.     Assessment and Plan  1. CAD - doing well cardiac wise.  - unclear if GI issues were related to statin or beta blocker in the past, he is off both with resolution. Will try low dose pravastastin 20mg  daily to see how tolerated.   2. History of PE - defer decision to continue coumadin to pcp and hematology who is following, at this time decision is to continue coumadin.      F/u 04/2018.  At that time likely d/c plavix.        Antoine Poche, M.D.

## 2017-12-31 ENCOUNTER — Ambulatory Visit (INDEPENDENT_AMBULATORY_CARE_PROVIDER_SITE_OTHER): Payer: BLUE CROSS/BLUE SHIELD | Admitting: *Deleted

## 2017-12-31 DIAGNOSIS — Z23 Encounter for immunization: Secondary | ICD-10-CM | POA: Diagnosis not present

## 2017-12-31 DIAGNOSIS — Z7901 Long term (current) use of anticoagulants: Secondary | ICD-10-CM | POA: Diagnosis not present

## 2017-12-31 LAB — POCT INR: INR: 2 (ref 2.0–3.0)

## 2018-01-28 ENCOUNTER — Ambulatory Visit (INDEPENDENT_AMBULATORY_CARE_PROVIDER_SITE_OTHER): Payer: BLUE CROSS/BLUE SHIELD

## 2018-01-28 DIAGNOSIS — Z7901 Long term (current) use of anticoagulants: Secondary | ICD-10-CM

## 2018-01-28 LAB — POCT INR: INR: 2.3 (ref 2.0–3.0)

## 2018-01-28 NOTE — Patient Instructions (Signed)
Continue same

## 2018-02-24 ENCOUNTER — Encounter: Payer: Self-pay | Admitting: Cardiology

## 2018-03-03 ENCOUNTER — Ambulatory Visit (INDEPENDENT_AMBULATORY_CARE_PROVIDER_SITE_OTHER): Payer: BLUE CROSS/BLUE SHIELD

## 2018-03-03 DIAGNOSIS — Z7901 Long term (current) use of anticoagulants: Secondary | ICD-10-CM | POA: Diagnosis not present

## 2018-03-03 LAB — POCT INR: INR: 2.3 (ref 2.0–3.0)

## 2018-03-03 NOTE — Patient Instructions (Signed)
Continue the same treatment. Return for nurse visit in 4 weeks.

## 2018-03-17 ENCOUNTER — Other Ambulatory Visit: Payer: Self-pay | Admitting: Cardiology

## 2018-03-17 MED ORDER — CLOPIDOGREL BISULFATE 75 MG PO TABS: 75.0000 mg | ORAL_TABLET | Freq: Every day | ORAL | 3 refills | Status: DC

## 2018-03-17 NOTE — Telephone Encounter (Signed)
Refilled plavix 

## 2018-03-17 NOTE — Telephone Encounter (Signed)
Patient needs refill on Plavix sent to [facility]. / tg

## 2018-03-31 ENCOUNTER — Ambulatory Visit (INDEPENDENT_AMBULATORY_CARE_PROVIDER_SITE_OTHER): Payer: BLUE CROSS/BLUE SHIELD | Admitting: *Deleted

## 2018-03-31 DIAGNOSIS — Z7901 Long term (current) use of anticoagulants: Secondary | ICD-10-CM

## 2018-03-31 LAB — POCT INR: INR: 2.5 (ref 2.0–3.0)

## 2018-05-04 ENCOUNTER — Ambulatory Visit (INDEPENDENT_AMBULATORY_CARE_PROVIDER_SITE_OTHER): Payer: BLUE CROSS/BLUE SHIELD

## 2018-05-04 DIAGNOSIS — Z7901 Long term (current) use of anticoagulants: Secondary | ICD-10-CM | POA: Diagnosis not present

## 2018-05-04 LAB — POCT INR: INR: 2.6 (ref 2.0–3.0)

## 2018-05-04 NOTE — Patient Instructions (Signed)
Continue same treatment. (One tablet by mouth each day). Recheck INR in 4 weeks

## 2018-05-06 ENCOUNTER — Ambulatory Visit: Payer: BLUE CROSS/BLUE SHIELD | Admitting: Cardiology

## 2018-05-11 ENCOUNTER — Encounter: Payer: Self-pay | Admitting: Cardiology

## 2018-05-11 ENCOUNTER — Ambulatory Visit (INDEPENDENT_AMBULATORY_CARE_PROVIDER_SITE_OTHER): Payer: BLUE CROSS/BLUE SHIELD | Admitting: Cardiology

## 2018-05-11 VITALS — BP 122/64 | HR 69 | Ht 68.0 in | Wt 167.0 lb

## 2018-05-11 DIAGNOSIS — I252 Old myocardial infarction: Secondary | ICD-10-CM | POA: Diagnosis not present

## 2018-05-11 DIAGNOSIS — I2510 Atherosclerotic heart disease of native coronary artery without angina pectoris: Secondary | ICD-10-CM

## 2018-05-11 NOTE — Patient Instructions (Signed)
Medication Instructions:  Stop Plavix   Labwork: None  Testing/Procedures: none  Follow-Up: Your physician wants you to follow-up in: 6 months.  You will receive a reminder letter in the mail two months in advance. If you don't receive a letter, please call our office to schedule the follow-up appointment.   Any Other Special Instructions Will Be Listed Below (If Applicable).     If you need a refill on your cardiac medications before your next appointment, please call your pharmacy.

## 2018-05-11 NOTE — Progress Notes (Signed)
Clinical Summary Mr. Popwell is a 73 y.o.male seen today for follow up of the following medical problems.   1. CAD - admit Jan 2019 with subcascpular pain, found to have NSTEMI peak trop 31 - cath Jan 2019 staged PCI LCX and LAD.  - plans were for triple therapy withasa, plavix, coumdin (for history of PE) x 1 month, then plavix and coumadin - Jan 2019 echo LVEF 60-65%, no wma's, grade I diatolic dysfunction.  - tried holding atorvastatin x 2 weeks due to diarrhea without improvement. He was to restart atorva and then hold coreg but ended up stopping both. Symptoms resolved.    - no recent chest pain, no SOB/DOE - compliant with meds. Rare diarrhea - upcoming labs with pcp  2. History of bilateral PE - occurred after immobility after back surgeryin 10/2017 - 09/2013 LE US showed chronic DVT left femoral vein - 06/2017 CT PE no PE - 06/2017 LE venous US: left popliteal vein DVT new compared to 09/2013 study. Chronic DVT left femoral and tibial veins. Chronic DVT left deep femoral vein.  - on life long coumadin from notes, notes indicate this was recommended by hematology in the past -seen again by hematology, decision made to continue anticoagulation.    Past Medical History:  Diagnosis Date  . Anticoagulated on Coumadin   . Avascular necrosis of hip (HCC)    LEFT  . DVT (deep venous thrombosis) (HCC) 2009  . Dyslipidemia, goal LDL below 70 04/09/2017  . Family history of anesthesia complication    BROTHER HAD MALIGNANT HYPOTHERMIA - PT HAS NOT HAD ANY PROBLEMS WITH ANESTHESIA    . History of kidney stones   . Malignant hyperthermia    PT'S BROTHER HAS HX MALIGNANT HYPERTHERMIA  . NSTEMI (non-ST elevated myocardial infarction) (HCC) 04/09/2017  . Pulmonary embolism (HCC) 2009  . Vertigo    NO RECENT PROBLEMS     Allergies  Allergen Reactions  . Lyrica [Pregabalin]     "went out of head"     Current Outpatient Medications  Medication Sig Dispense  Refill  . acetaminophen (TYLENOL) 500 MG tablet Take 2 tablets (1,000 mg total) by mouth every 8 (eight) hours. 30 tablet 0  . clopidogrel (PLAVIX) 75 MG tablet Take 1 tablet (75 mg total) by mouth daily. 90 tablet 3  . lisinopril (PRINIVIL,ZESTRIL) 5 MG tablet Take 1 tablet (5 mg total) by mouth daily. 30 tablet 11  . nitroGLYCERIN (NITROSTAT) 0.4 MG SL tablet Place 1 tablet (0.4 mg total) under the tongue every 5 (five) minutes x 3 doses as needed for chest pain (If no relief after 1st dose, proceed to the ED for an evaluation). 25 tablet 3  . pravastatin (PRAVACHOL) 20 MG tablet Take 1 tablet (20 mg total) by mouth every evening. 90 tablet 3  . sildenafil (REVATIO) 20 MG tablet Take 2-3 tablets (40-60 mg total) by mouth as needed. 30 minutes before intercourse 30 tablet 6  . warfarin (COUMADIN) 5 MG tablet TAKE 1 TABLET DAILY except on Tuesday taking 2.5mg  90 tablet 3   No current facility-administered medications for this visit.      Past Surgical History:  Procedure Laterality Date  . BACK SURGERY  2009   DISCECTOMY  . CERVICAL FUSION  2008  . CORONARY STENT INTERVENTION N/A 04/09/2017   Procedure: CORONARY STENT INTERVENTION;  Surgeon: Yvonne Kendall, MD;  Location: [facility];  Service: Cardiovascular;  Laterality: N/A;  . CORONARY STENT INTERVENTION N/A 04/12/2017  Procedure: CORONARY STENT INTERVENTION;  Surgeon: Yvonne Kendall, MD;  Location: [facility];  Service: Cardiovascular;  Laterality: N/A;  . INTRAVASCULAR ULTRASOUND/IVUS N/A 04/12/2017   Procedure: Intravascular Ultrasound/IVUS;  Surgeon: Yvonne Kendall, MD;  Location: [facility];  Service: Cardiovascular;  Laterality: N/A;  . LEFT HEART CATH AND CORONARY ANGIOGRAPHY N/A 04/09/2017   Procedure: LEFT HEART CATH AND CORONARY ANGIOGRAPHY;  Surgeon: Yvonne Kendall, MD;  Location: [facility];  Service: Cardiovascular;  Laterality: N/A;  . TOTAL HIP ARTHROPLASTY Right 12/22/2013   Procedure:  RIGHT TOTAL HIP ARTHROPLASTY ANTERIOR APPROACH;  Surgeon: Shelda Pal, MD;  Location: [facility];  Service: Orthopedics;  Laterality: Right;  . TOTAL HIP ARTHROPLASTY Left 04/30/2015   Procedure: LEFT TOTAL HIP ARTHROPLASTY ANTERIOR APPROACH;  Surgeon: Durene Romans, MD;  Location: [facility];  Service: Orthopedics;  Laterality: Left;     Allergies  Allergen Reactions  . Lyrica [Pregabalin]     "went out of head"      Family History  Problem Relation Age of Onset  . Breast cancer Mother   . Hypertension Mother   . Hyperlipidemia Mother   . Heart attack Mother 55  . Stroke Mother   . Hypertension Paternal Grandmother   . Diabetes Paternal Grandmother   . Cirrhosis Sister   . Cancer Brother      Social History Mr. Braz reports that he has never smoked. He has never used smokeless tobacco. Mr. Hardaway reports no history of alcohol use.   Review of Systems CONSTITUTIONAL: No weight loss, fever, chills, weakness or fatigue.  HEENT: Eyes: No visual loss, blurred vision, double vision or yellow sclerae.No hearing loss, sneezing, congestion, runny nose or sore throat.  SKIN: No rash or itching.  CARDIOVASCULAR: per hpi RESPIRATORY: per hpi  GASTROINTESTINAL: No anorexia, nausea, vomiting or diarrhea. No abdominal pain or blood.  GENITOURINARY: No burning on urination, no polyuria NEUROLOGICAL: No headache, dizziness, syncope, paralysis, ataxia, numbness or tingling in the extremities. No change in bowel or bladder control.  MUSCULOSKELETAL: No muscle, back pain, joint pain or stiffness.  LYMPHATICS: No enlarged nodes. No history of splenectomy.  PSYCHIATRIC: No history of depression or anxiety.  ENDOCRINOLOGIC: No reports of sweating, cold or heat intolerance. No polyuria or polydipsia.  Marland Kitchen   Physical Examination Vitals:   05/11/18 0819  BP: 122/64  Pulse: 69  SpO2: 98%   Vitals:   05/11/18 0819  Weight: 167 lb (75.8 kg)  Height: 5\' 8"  (1.727 m)    Gen: resting  comfortably, no acute distress HEENT: no scleral icterus, pupils equal round and reactive, no palptable cervical adenopathy,  CV: RRR, no m/r/g, no jvd Resp: Clear to auscultation bilaterally GI: abdomen is soft, non-tender, non-distended, normal bowel sounds, no hepatosplenomegaly MSK: extremities are warm, no edema.  Skin: warm, no rash Neuro:  no focal deficits Psych: appropriate affect   Diagnostic Studies Jan 2019 cath Conclusions: 1. Significant two-vessel coronary artery disease with 80-90% mid LAD stenosis and occluded mid LCx. 2. Mild to moderate right coronary artery disease. Proximal and mid portions of the LAD and RCA are ectatic/aneurysmal. 3. Mildly to moderately reduced left ventricular contraction with mid anterolateral hypokinesis. Low normal LVEDP. 4. Successful PCI to mid LCx using Resolute Onyx 2.0 x 2.0 x 22 mm DES (post-dilated to 2.6 mm proximally) with 0% residual stenosis and TIMI-3 flow.  Recommendations: 1. Plan for staged PCI to LAD next week, as renal function allows. 2. ASA and ticagrelor given today;  recommend switching ticagrelor to clopidogrel prior to discharge. Continue warfarin, clopidogrel, and aspirin x 1 month, then discontinue aspirin and complete at least 12 months of warfarin and clopidogrel. 3. Aggressive secondary prevention.   Jan 2019 cath staged PCI Conclusions: 1. Aneurysmal LAD with sequential moderate to severe stenosis involving the proximal and mid portions of the vessel. 2. Patent stent in the mid LCx. 3. Successful IVUS-guided PCI to the mid LAD with placement of non-overlapping Resolute Onyx 3.5 x 8 mm (proximal) and Resolute Onyx 2.75 x 38 mm (distal) drug-eluting stents with 0% residual stenosis and TIMI-3 flow.  Recommendations: 1. Transition from ticagrelor to clopidogrel; will load with clopidogrel 300 mg x 1 tomorrow morning, followed by 75 mg daily thereafter. 2. Resume warfarin per pharmacy tonight. Patient and his  family report that he has been bridged in the past for procedures requiring cessation of warfarin. If there is no evidence of bleeding or vascular injury tomorrow, Lovenox bridge should be started tomorrow morning per pharmacy. 3. Continue aspirin, clopidogrel, and warfarin x 1 month. If INR therapeutic/stable, aspirin can be discontinued at that time. 4. Aggressive secondary prevention.  Jan 2019 echo Study Conclusions  - Left ventricle: The cavity size was normal. Wall thickness was normal. Systolic function was normal. The estimated ejection fraction was in the range of 60% to 65%. Wall motion was normal; there were no regional wall motion abnormalities. Doppler parameters are consistent with abnormal left ventricular relaxation (grade 1 diastolic dysfunction). The E/e&' ratio is between 8-15, suggesting indeterminate LV filling pressure. - Aortic valve: Trileaflet. Sclerosis without stenosis. There was trivial regurgitation. - Mitral valve: Mildly thickened leaflets . There was trivial regurgitation. - Left atrium: The atrium was mildly dilated. - Tricuspid valve: There was trivial regurgitation. - Pulmonary arteries: PA peak pressure: 18 mm Hg (S). - Inferior vena cava: The vessel was normal in size. The respirophasic diameter changes were in the normal range (>= 50%), consistent with normal central venous pressure.  Impressions:  - LVEF 60-65%, normal wall thickness, normal wall motion, grade 1 DD, indeterminate LV filling pressure, aortic valve sclerosis with trivial AI, trivial MR, mild LAE, trivial TR, RVSP 18 mmHg, normal IVC.    Assessment and Plan  1. CAD - no recent symptoms. He is over a year on DAPT after his ACS, we will stop plavix - continue current meds, medical therapy limited due to GI side effects as reported above.       Antoine Poche, M.D.,

## 2018-05-12 IMAGING — US US EXTREM LOW VENOUS BILAT
1 series · 12 of 24 positions shown · non-contrast
Comparison: Bilateral lower extremity venous Doppler
ultrasound-10/16/2013

CLINICAL DATA: Evaluate lower extremity DVT.



[Series 1: us extrem low venous bilat · 12 of 75 slices shown]
[im 4/75]
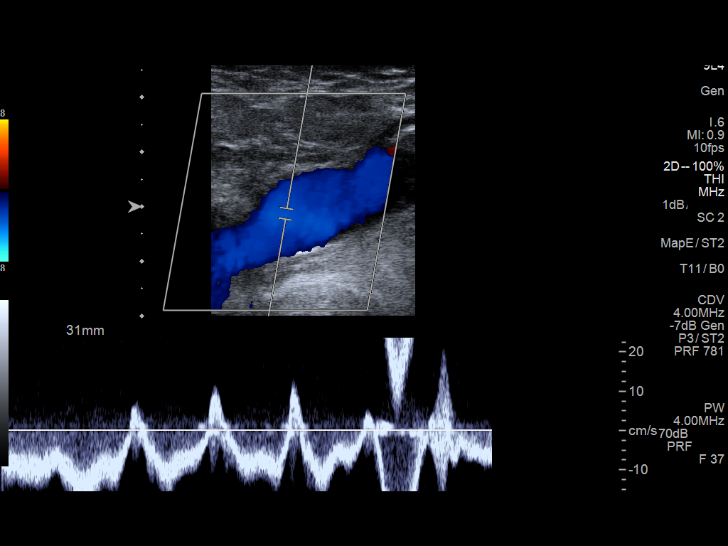
[im 10/75]
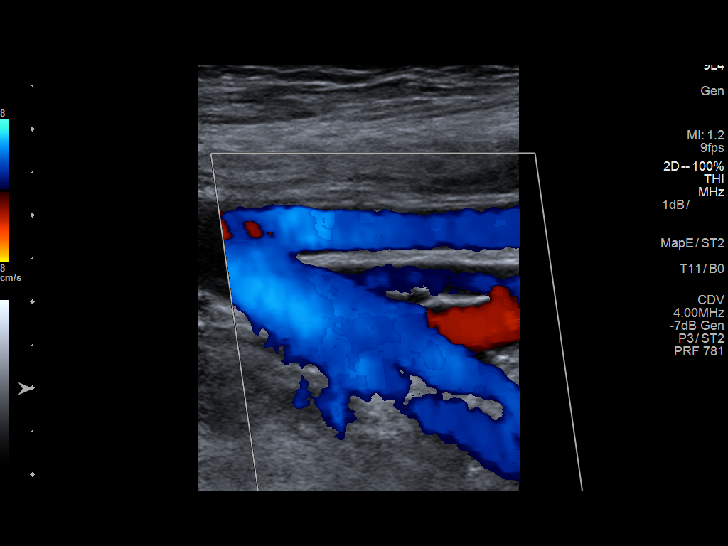
[im 17/75]
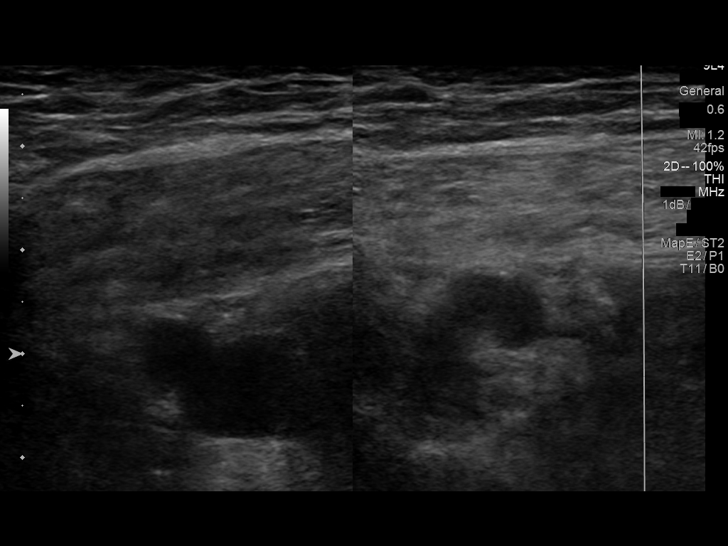
[im 23/75]
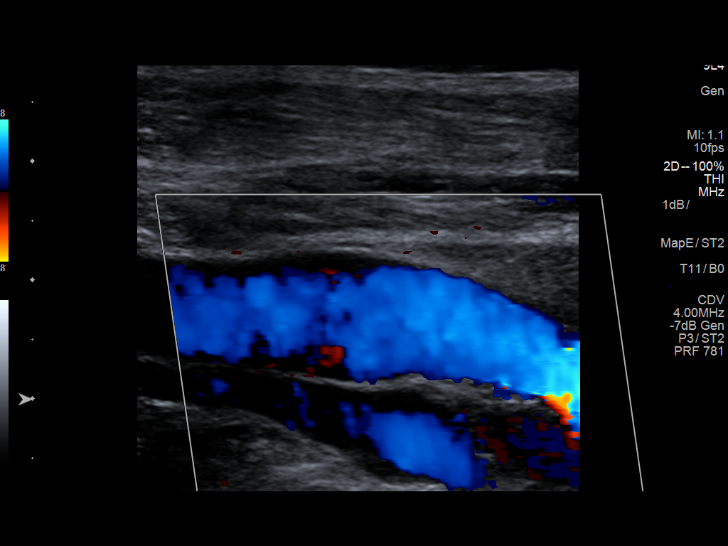
[im 29/75]
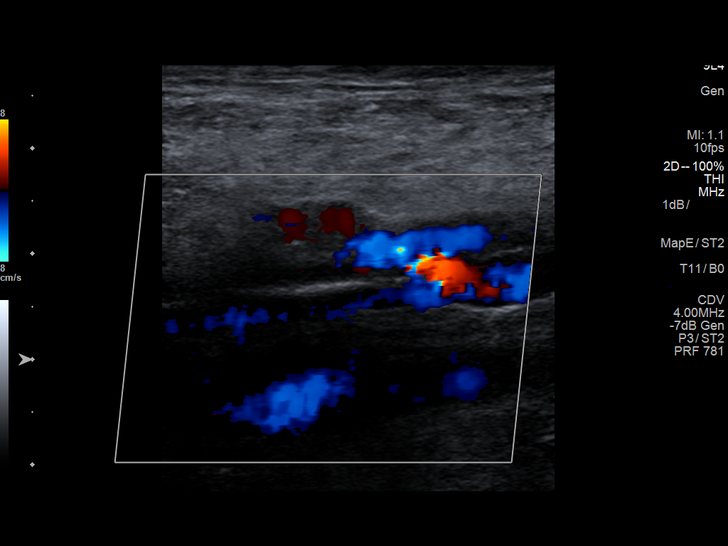
[im 36/75]
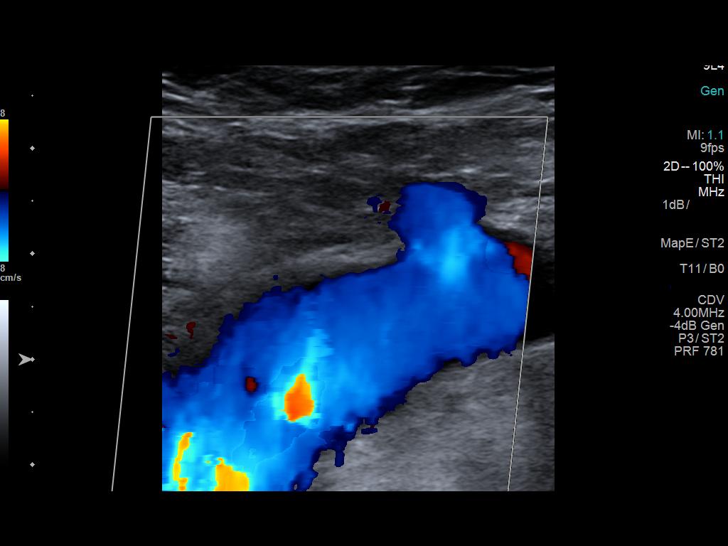
[im 42/75]
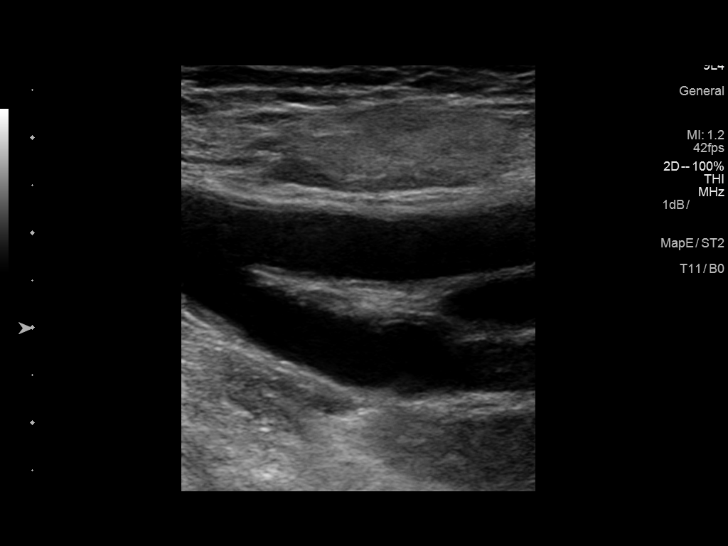
[im 49/75]
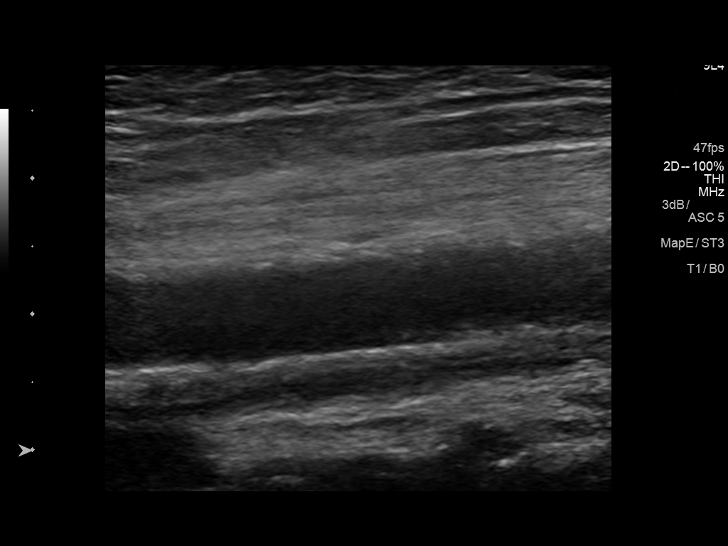
[im 55/75]
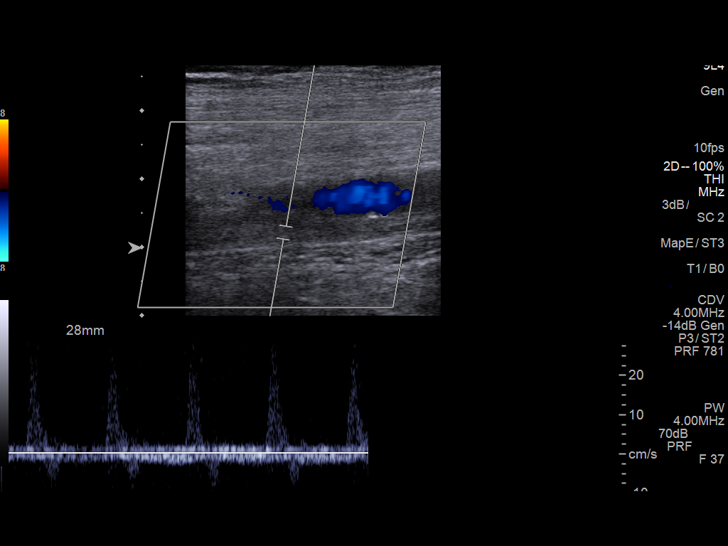
[im 62/75]
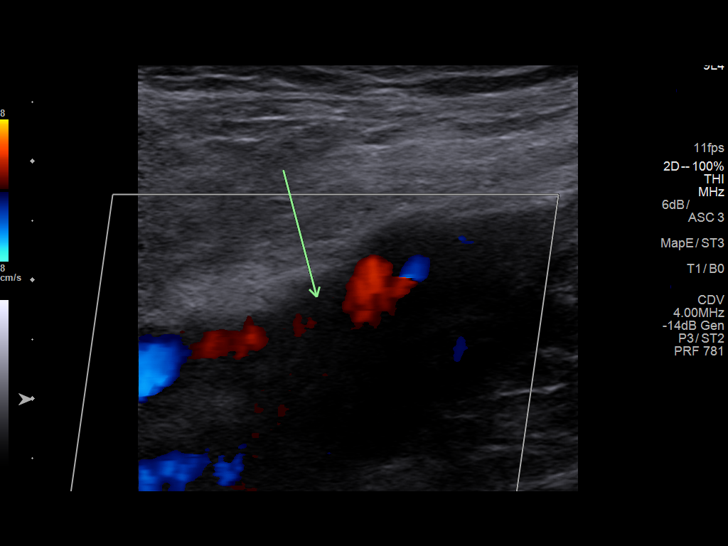
[im 68/75]
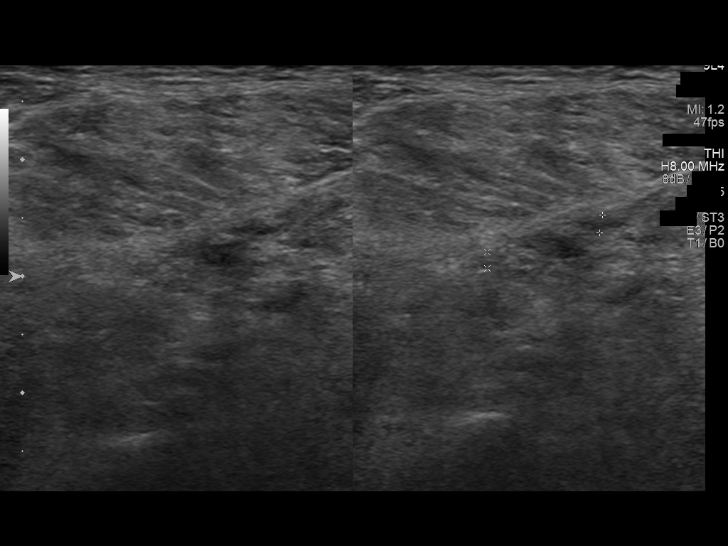
[im 75/75]
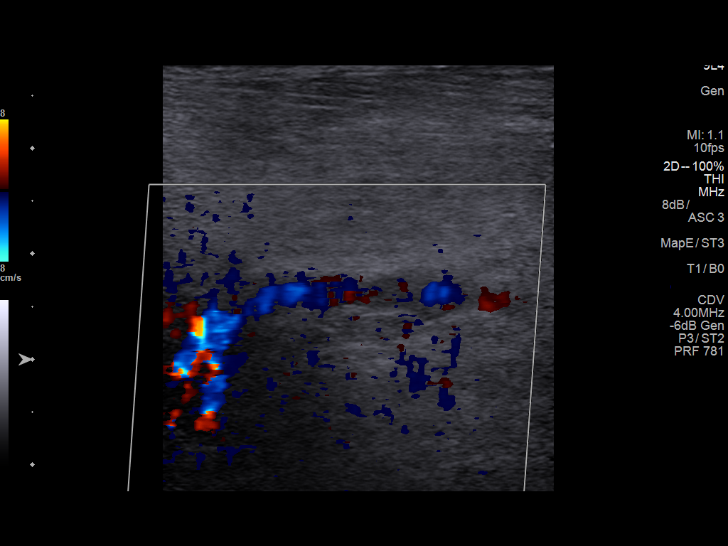

[12 of 24 positions shown; findings below may reference images not displayed]

FINDINGS: RIGHT LOWER EXTREMITY

Common Femoral Vein: No evidence of thrombus. Normal
compressibility, respiratory phasicity and response to augmentation.

Saphenofemoral Junction: No evidence of thrombus. Normal
compressibility and flow on color Doppler imaging.

Profunda Femoral Vein: No evidence of thrombus. Normal
compressibility and flow on color Doppler imaging.

Femoral Vein: No evidence of thrombus. Normal compressibility,
respiratory phasicity and response to augmentation.

Popliteal Vein: No evidence of thrombus. Normal compressibility,
respiratory phasicity and response to augmentation.

Calf Veins: No evidence of thrombus. Normal compressibility and flow
on color Doppler imaging.

Superficial Great Saphenous Vein: No evidence of thrombus. Normal
compressibility.

Venous Reflux:  None.

Other Findings:  None.

LEFT LOWER EXTREMITY

Common Femoral Vein: No evidence of thrombus. Normal
compressibility, respiratory phasicity and response to augmentation.

Saphenofemoral Junction: No evidence of thrombus. Normal
compressibility and flow on color Doppler imaging.

Profunda Femoral Vein: There is nonocclusive mixed echogenic chronic
DVT/wall thickening within the left deep femoral vein (images 47 and
48).

Femoral Vein: Re demonstrated occlusion of atretic left femoral
vein, similar to the [DATE] examination.

Popliteal Vein: There is near occlusive hypoechoic slightly
expansile DVT involving the left popliteal vein (image 65), new
compared to the [DATE] examination.

Calf Veins: There is hypoechoic occlusive thrombus within both
paired left posterior tibial veins (images 70 and 71 as well as both
paired left peroneal veins (images 73 and 75, similar to the [DATE]
examination.

Superficial Great Saphenous Vein: No evidence of thrombus. Normal
compressibility.

Venous Reflux:  None.

Other Findings:  None.
IMPRESSION: 1. Hypoechoic slightly expansile DVT within the left popliteal vein,
new compared to the [DATE] examination - in the absence of interval
prior examinations, an acute on chronic process is not excluded and
clinical correlation is advised.
2. Chronic occlusive DVT involving the left femoral and tibial veins
as detailed above.
3. Chronic nonocclusive DVT involving the left deep femoral vein.
4. No evidence of acute or chronic DVT within the right lower
extremity.

## 2018-06-01 ENCOUNTER — Ambulatory Visit (INDEPENDENT_AMBULATORY_CARE_PROVIDER_SITE_OTHER): Payer: BLUE CROSS/BLUE SHIELD

## 2018-06-01 ENCOUNTER — Other Ambulatory Visit: Payer: Self-pay

## 2018-06-01 DIAGNOSIS — Z7901 Long term (current) use of anticoagulants: Secondary | ICD-10-CM

## 2018-06-01 LAB — POCT INR: INR: 2.1 (ref 2.0–3.0)

## 2018-06-01 NOTE — Patient Instructions (Signed)
Continue same treatment. Recheck in 4 weeks 

## 2018-06-24 ENCOUNTER — Ambulatory Visit (HOSPITAL_COMMUNITY): Payer: BLUE CROSS/BLUE SHIELD | Admitting: Hematology

## 2018-06-24 ENCOUNTER — Other Ambulatory Visit (HOSPITAL_COMMUNITY): Payer: BLUE CROSS/BLUE SHIELD

## 2018-07-08 ENCOUNTER — Encounter: Payer: BLUE CROSS/BLUE SHIELD | Admitting: Family Medicine

## 2018-08-31 ENCOUNTER — Other Ambulatory Visit: Payer: Self-pay | Admitting: Cardiology

## 2018-09-05 ENCOUNTER — Other Ambulatory Visit: Payer: Self-pay

## 2018-09-05 ENCOUNTER — Ambulatory Visit (INDEPENDENT_AMBULATORY_CARE_PROVIDER_SITE_OTHER): Payer: BC Managed Care – PPO | Admitting: *Deleted

## 2018-09-05 DIAGNOSIS — Z7901 Long term (current) use of anticoagulants: Secondary | ICD-10-CM | POA: Diagnosis not present

## 2018-09-05 LAB — POCT INR: INR: 3.9 — AB (ref 2.0–3.0)

## 2018-09-05 NOTE — Patient Instructions (Signed)
Coumadin 5mg  one every day except on mondays take one half tablet. Recheck INR in 4 weeks

## 2018-10-05 ENCOUNTER — Ambulatory Visit (INDEPENDENT_AMBULATORY_CARE_PROVIDER_SITE_OTHER): Payer: BC Managed Care – PPO | Admitting: *Deleted

## 2018-10-05 ENCOUNTER — Telehealth: Payer: Self-pay | Admitting: Family Medicine

## 2018-10-05 ENCOUNTER — Other Ambulatory Visit: Payer: Self-pay

## 2018-10-05 DIAGNOSIS — I2510 Atherosclerotic heart disease of native coronary artery without angina pectoris: Secondary | ICD-10-CM

## 2018-10-05 DIAGNOSIS — Z7901 Long term (current) use of anticoagulants: Secondary | ICD-10-CM | POA: Diagnosis not present

## 2018-10-05 LAB — POCT INR: INR: 2.2 (ref 2.0–3.0)

## 2018-10-06 ENCOUNTER — Other Ambulatory Visit: Payer: Self-pay | Admitting: *Deleted

## 2018-10-06 MED ORDER — WARFARIN SODIUM 5 MG PO TABS: ORAL_TABLET | ORAL | 5 refills | Status: DC

## 2018-10-13 NOTE — Telephone Encounter (Signed)
error 

## 2018-11-02 ENCOUNTER — Ambulatory Visit (INDEPENDENT_AMBULATORY_CARE_PROVIDER_SITE_OTHER): Payer: BC Managed Care – PPO

## 2018-11-02 ENCOUNTER — Other Ambulatory Visit: Payer: Self-pay

## 2018-11-02 DIAGNOSIS — Z7901 Long term (current) use of anticoagulants: Secondary | ICD-10-CM | POA: Diagnosis not present

## 2018-11-02 LAB — POCT INR: INR: 2.4 (ref 2.0–3.0)

## 2018-11-02 NOTE — Patient Instructions (Signed)
Continue same treatment; recheck in 4 weeks.  °

## 2018-11-10 ENCOUNTER — Encounter: Payer: Self-pay | Admitting: Cardiology

## 2018-11-10 ENCOUNTER — Other Ambulatory Visit: Payer: Self-pay

## 2018-11-10 ENCOUNTER — Ambulatory Visit (INDEPENDENT_AMBULATORY_CARE_PROVIDER_SITE_OTHER): Payer: BLUE CROSS/BLUE SHIELD | Admitting: Cardiology

## 2018-11-10 VITALS — BP 124/75 | HR 74 | Temp 98.1°F | Ht 68.0 in | Wt 166.0 lb

## 2018-11-10 DIAGNOSIS — I2510 Atherosclerotic heart disease of native coronary artery without angina pectoris: Secondary | ICD-10-CM

## 2018-11-10 DIAGNOSIS — Z86711 Personal history of pulmonary embolism: Secondary | ICD-10-CM

## 2018-11-10 MED ORDER — LISINOPRIL 5 MG PO TABS: 5.0000 mg | ORAL_TABLET | Freq: Every day | ORAL | 11 refills | Status: DC

## 2018-11-10 MED ORDER — SILDENAFIL CITRATE 20 MG PO TABS: 40.0000 mg | ORAL_TABLET | ORAL | 6 refills | Status: DC | PRN

## 2018-11-10 NOTE — Progress Notes (Signed)
Clinical Summary [name] is a 73 y.o.male seen today for follow up of the following medical problems.   1. CAD - admit Jan 2019 with subcascpular pain, found to have NSTEMI peak trop 31 - cath Jan 2019 staged PCI LCX and LAD.  - plans were for triple therapy withasa, plavix, coumdin (for history of PE) x 1 month, then plavix and coumadin - Jan 2019 echo LVEF 60-65%, no wma's, grade I diatolic dysfunction.  - previously came off beta blocker and statin due to diarrhea. He tried taking pravastatin low dose and did so for a few months but had recurrent diarrhea and ended up stopping   2. History of bilateral PE - occurred after immobility after back surgeryin 10/2017 - 09/2013 LE US showed chronic DVT left femoral vein - 06/2017 CT PE no PE - 06/2017 LE venous US: left popliteal vein DVT new compared to 09/2013 study. Chronic DVT left femoral and tibial veins. Chronic DVT left deep femoral vein.  - on life long coumadin from notes, notes indicate this was recommended by hematology in the past -seen again by hematology, decision made to continue anticoagulation.  - no bleeding on coumadin.    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 Past Medical History:  Diagnosis Date   Anticoagulated on Coumadin    Avascular necrosis of hip (HCC)    LEFT   DVT (deep venous thrombosis) (HCC) 2009   Dyslipidemia, goal LDL below 70 04/09/2017   Family history of anesthesia complication    BROTHER HAD MALIGNANT HYPOTHERMIA - PT HAS NOT HAD ANY PROBLEMS WITH ANESTHESIA     History of kidney stones    Malignant hyperthermia    PT'S BROTHER HAS HX MALIGNANT HYPERTHERMIA   NSTEMI (non-ST elevated myocardial infarction) (HCC) 04/09/2017   Pulmonary embolism (HCC) 2009   Vertigo    NO RECENT PROBLEMS     Allergies  Allergen Reactions   Lyrica [Pregabalin]     "went out of head"     Current Outpatient Medications  Medication Sig Dispense Refill    acetaminophen (TYLENOL) 500 MG tablet Take 2 tablets (1,000 mg total) by mouth every 8 (eight) hours. 30 tablet 0   lisinopril (ZESTRIL) 5 MG tablet TAKE 1 TABLET BY MOUTH EVERY DAY 30 tablet 11   nitroGLYCERIN (NITROSTAT) 0.4 MG SL tablet 1 TAB UNDER TONGUE EVERY 5 MINUTES X3 DOSES AS NEED CHEST PAIN. IF NO RELIEF AFTER 1ST DOSE GO TO ER 25 tablet 3   pravastatin (PRAVACHOL) 20 MG tablet Take 1 tablet (20 mg total) by mouth every evening. 90 tablet 3   sildenafil (REVATIO) 20 MG tablet Take 2-3 tablets (40-60 mg total) by mouth as needed. 30 minutes before intercourse 30 tablet 6   warfarin (COUMADIN) 5 MG tablet TAKE 1 TABLET DAILY except on Tuesday taking 2.5mg  90 tablet 5   No current facility-administered medications for this visit.      Past Surgical History:  Procedure Laterality Date   BACK SURGERY  2009   DISCECTOMY   CERVICAL FUSION  2008   CORONARY STENT INTERVENTION N/A 04/09/2017   Procedure: CORONARY STENT INTERVENTION;  Surgeon: Yvonne Kendall, MD;  Location: [facility];  Service: Cardiovascular;  Laterality: N/A;   CORONARY STENT INTERVENTION N/A 04/12/2017   Procedure: CORONARY STENT INTERVENTION;  Surgeon: Yvonne Kendall, MD;  Location: [facility];  Service: Cardiovascular;  Laterality: N/A;   INTRAVASCULAR ULTRASOUND/IVUS N/A 04/12/2017   Procedure:  Intravascular Ultrasound/IVUS;  Surgeon: Yvonne KendallEnd, Christopher, MD;  Location: [facility];  Service: Cardiovascular;  Laterality: N/A;   LEFT HEART CATH AND CORONARY ANGIOGRAPHY N/A 04/09/2017   Procedure: LEFT HEART CATH AND CORONARY ANGIOGRAPHY;  Surgeon: Yvonne KendallEnd, Christopher, MD;  Location: [facility];  Service: Cardiovascular;  Laterality: N/A;   TOTAL HIP ARTHROPLASTY Right 12/22/2013   Procedure: RIGHT TOTAL HIP ARTHROPLASTY ANTERIOR APPROACH;  Surgeon: Shelda PalMatthew D Olin, MD;  Location: [facility];  Service: Orthopedics;  Laterality: Right;   TOTAL HIP ARTHROPLASTY Left 04/30/2015   Procedure:  LEFT TOTAL HIP ARTHROPLASTY ANTERIOR APPROACH;  Surgeon: Durene RomansMatthew Olin, MD;  Location: [facility];  Service: Orthopedics;  Laterality: Left;     Allergies  Allergen Reactions   Lyrica [Pregabalin]     "went out of head"      Family History  Problem Relation Age of Onset   Breast cancer Mother    Hypertension Mother    Hyperlipidemia Mother    Heart attack Mother [id number]   Stroke Mother    Hypertension Paternal Grandmother    Diabetes Paternal Grandmother    Cirrhosis Sister    Cancer Brother      Social History Mr. Bing PlumeHaynes reports that he has never smoked. He has never used smokeless tobacco. Mr. Bing PlumeHaynes reports no history of alcohol use.   Review of Systems CONSTITUTIONAL: No weight loss, fever, chills, weakness or fatigue.  HEENT: Eyes: No visual loss, blurred vision, double vision or yellow sclerae.No hearing loss, sneezing, congestion, runny nose or sore throat.  SKIN: No rash or itching.  CARDIOVASCULAR: per hpi RESPIRATORY: No shortness of breath, cough or sputum.  GASTROINTESTINAL: No anorexia, nausea, vomiting or diarrhea. No abdominal pain or blood.  GENITOURINARY: No burning on urination, no polyuria NEUROLOGICAL: No headache, dizziness, syncope, paralysis, ataxia, numbness or tingling in the extremities. No change in bowel or bladder control.  MUSCULOSKELETAL: No muscle, back pain, joint pain or stiffness.  LYMPHATICS: No enlarged nodes. No history of splenectomy.  PSYCHIATRIC: No history of depression or anxiety.  ENDOCRINOLOGIC: No reports of sweating, cold or heat intolerance. No polyuria or polydipsia.  Marland Kitchen.   Physical Examination Today's Vitals   11/10/18 1254  BP: 124/75  Pulse: 74  Temp: 98.1 F (36.7 C)  SpO2: 98%  Weight: 166 lb (75.3 kg)  Height: 5\' 8"  (1.727 m)   Body mass index is 25.24 kg/m.  Gen: resting comfortably, no acute distress HEENT: no scleral icterus, pupils equal round and reactive, no palptable cervical adenopathy,  CV:  RRR, no m/r/g, no jvd Resp: Clear to auscultation bilaterally GI: abdomen is soft, non-tender, non-distended, normal bowel sounds, no hepatosplenomegaly MSK: extremities are warm, no edema.  Skin: warm, no rash Neuro:  no focal deficits Psych: appropriate affect   Diagnostic Studies Jan 2019 cath Conclusions: 1. Significant two-vessel coronary artery disease with 80-90% mid LAD stenosis and occluded mid LCx. 2. Mild to moderate right coronary artery disease. Proximal and mid portions of the LAD and RCA are ectatic/aneurysmal. 3. Mildly to moderately reduced left ventricular contraction with mid anterolateral hypokinesis. Low normal LVEDP. 4. Successful PCI to mid LCx using Resolute Onyx 2.0 x 2.0 x 22 mm DES (post-dilated to 2.6 mm proximally) with 0% residual stenosis and TIMI-3 flow.  Recommendations: 1. Plan for staged PCI to LAD next week, as renal function allows. 2. ASA and ticagrelor given today; recommend switching ticagrelor to clopidogrel prior to discharge. Continue warfarin, clopidogrel, and aspirin x 1 month, then discontinue aspirin  and complete at least 12 months of warfarin and clopidogrel. 3. Aggressive secondary prevention.   Jan 2019 cath staged PCI Conclusions: 1. Aneurysmal LAD with sequential moderate to severe stenosis involving the proximal and mid portions of the vessel. 2. Patent stent in the mid LCx. 3. Successful IVUS-guided PCI to the mid LAD with placement of non-overlapping Resolute Onyx 3.5 x 8 mm (proximal) and Resolute Onyx 2.75 x 38 mm (distal) drug-eluting stents with 0% residual stenosis and TIMI-3 flow.  Recommendations: 1. Transition from ticagrelor to clopidogrel; will load with clopidogrel 300 mg x 1 tomorrow morning, followed by 75 mg daily thereafter. 2. Resume warfarin per pharmacy tonight. Patient and his family report that he has been bridged in the past for procedures requiring cessation of warfarin. If there is no evidence of bleeding  or vascular injury tomorrow, Lovenox bridge should be started tomorrow morning per pharmacy. 3. Continue aspirin, clopidogrel, and warfarin x 1 month. If INR therapeutic/stable, aspirin can be discontinued at that time. 4. Aggressive secondary prevention.  Jan 2019 echo Study Conclusions  - Left ventricle: The cavity size was normal. Wall thickness was normal. Systolic function was normal. The estimated ejection fraction was in the range of 60% to 65%. Wall motion was normal; there were no regional wall motion abnormalities. Doppler parameters are consistent with abnormal left ventricular relaxation (grade 1 diastolic dysfunction). The E/e&' ratio is between 8-15, suggesting indeterminate LV filling pressure. - Aortic valve: Trileaflet. Sclerosis without stenosis. There was trivial regurgitation. - Mitral valve: Mildly thickened leaflets . There was trivial regurgitation. - Left atrium: The atrium was mildly dilated. - Tricuspid valve: There was trivial regurgitation. - Pulmonary arteries: PA peak pressure: 18 mm Hg (S). - Inferior vena cava: The vessel was normal in size. The respirophasic diameter changes were in the normal range (>= 50%), consistent with normal central venous pressure.  Impressions:  - LVEF 60-65%, normal wall thickness, normal wall motion, grade 1 DD, indeterminate LV filling pressure, aortic valve sclerosis with trivial AI, trivial MR, mild LAE, trivial TR, RVSP 18 mmHg, normal IVC.    Assessment and Plan  1. CAD -no recent symptoms - no ASA since on coumadin. He stopped beta blocker and statin due to diarrhea. We tried an alternative statin but recurrent symptoms - continue current meds, f/u lipids from pcp, may consider zetia or pck9inhibtor though his LDL previously has not been bad  2. History of DVT/PE - continue coumadin   F/u 6 months  Arnoldo Lenis, M.D.

## 2018-11-10 NOTE — Patient Instructions (Signed)
Medication Instructions:  Your physician recommends that you continue on your current medications as directed. Please refer to the Current Medication list given to you today.  If you need a refill on your cardiac medications before your next appointment, please call your pharmacy.   Lab work: NONE   If you have labs (blood work) drawn today and your tests are completely normal, you will receive your results only by: . MyChart Message (if you have MyChart) OR . A paper copy in the mail If you have any lab test that is abnormal or we need to change your treatment, we will call you to review the results.  Testing/Procedures: NONE   Follow-Up: At [facility], you and your health needs are our priority.  As part of our continuing mission to provide you with exceptional heart care, we have created designated Provider Care Teams.  These Care Teams include your primary Cardiologist (physician) and Advanced Practice Providers (APPs -  Physician Assistants and Nurse Practitioners) who all work together to provide you with the care you need, when you need it. You will need a follow up appointment in 6 months.  Please call our office 2 months in advance to schedule this appointment.  You may see Branch, Jonathan, MD or one of the following Advanced Practice Providers on your designated Care Team:   Brittany Strader, PA-C ([place] Office) . Michele Lenze, PA-C ([place] Office)  Any Other Special Instructions Will Be Listed Below (If Applicable). Thank you for choosing [facility]!     

## 2018-11-30 ENCOUNTER — Other Ambulatory Visit: Payer: Self-pay

## 2018-11-30 ENCOUNTER — Telehealth: Payer: Self-pay | Admitting: *Deleted

## 2018-11-30 ENCOUNTER — Ambulatory Visit (INDEPENDENT_AMBULATORY_CARE_PROVIDER_SITE_OTHER): Payer: BC Managed Care – PPO | Admitting: *Deleted

## 2018-11-30 DIAGNOSIS — Z79899 Other long term (current) drug therapy: Secondary | ICD-10-CM

## 2018-11-30 DIAGNOSIS — E785 Hyperlipidemia, unspecified: Secondary | ICD-10-CM

## 2018-11-30 DIAGNOSIS — Z7901 Long term (current) use of anticoagulants: Secondary | ICD-10-CM

## 2018-11-30 DIAGNOSIS — Z125 Encounter for screening for malignant neoplasm of prostate: Secondary | ICD-10-CM

## 2018-11-30 DIAGNOSIS — Z0000 Encounter for general adult medical examination without abnormal findings: Secondary | ICD-10-CM

## 2018-11-30 LAB — POCT INR: INR: 1.8 — AB (ref 2.0–3.0)

## 2018-11-30 NOTE — Telephone Encounter (Signed)
Orders put in and pt was notified.  

## 2018-11-30 NOTE — Telephone Encounter (Signed)
same

## 2018-11-30 NOTE — Telephone Encounter (Signed)
Pt has physical scheduled and wants to know if he needs to do bw before coming in. Last labs 05/20/17 lipid, liver, bmp, psa

## 2018-11-30 NOTE — Patient Instructions (Signed)
Take coumadin 5 mg tablet one daily. Recheck INR in 4 weeks.

## 2018-12-09 DIAGNOSIS — Z0000 Encounter for general adult medical examination without abnormal findings: Secondary | ICD-10-CM | POA: Diagnosis not present

## 2018-12-09 DIAGNOSIS — E785 Hyperlipidemia, unspecified: Secondary | ICD-10-CM | POA: Diagnosis not present

## 2018-12-09 DIAGNOSIS — Z125 Encounter for screening for malignant neoplasm of prostate: Secondary | ICD-10-CM | POA: Diagnosis not present

## 2018-12-09 DIAGNOSIS — Z79899 Other long term (current) drug therapy: Secondary | ICD-10-CM | POA: Diagnosis not present

## 2018-12-10 LAB — HEPATIC FUNCTION PANEL
ALT: 17 IU/L (ref 0–44)
AST: 19 IU/L (ref 0–40)
Albumin: 4.5 g/dL (ref 3.7–4.7)
Alkaline Phosphatase: 62 IU/L (ref 39–117)
Bilirubin Total: 0.4 mg/dL (ref 0.0–1.2)
Bilirubin, Direct: 0.1 mg/dL (ref 0.00–0.40)
Total Protein: 6.9 g/dL (ref 6.0–8.5)

## 2018-12-10 LAB — BASIC METABOLIC PANEL
BUN/Creatinine Ratio: 13 (ref 10–24)
BUN: 12 mg/dL (ref 8–27)
CO2: 25 mmol/L (ref 20–29)
Calcium: 9.2 mg/dL (ref 8.6–10.2)
Chloride: 102 mmol/L (ref 96–106)
Creatinine, Ser: 0.91 mg/dL (ref 0.76–1.27)
GFR calc Af Amer: 97 mL/min/{1.73_m2} (ref >59)
GFR calc non Af Amer: 84 mL/min/{1.73_m2} (ref >59)
Glucose: 95 mg/dL (ref 65–99)
Potassium: 4.1 mmol/L (ref 3.5–5.2)
Sodium: 139 mmol/L (ref 134–144)

## 2018-12-10 LAB — LIPID PANEL
Chol/HDL Ratio: 4.5 ratio (ref 0.0–5.0)
Cholesterol, Total: 187 mg/dL (ref 100–199)
HDL: 42 mg/dL (ref >39)
LDL Chol Calc (NIH): 125 mg/dL — ABNORMAL HIGH (ref 0–99)
Triglycerides: 110 mg/dL (ref 0–149)
VLDL Cholesterol Cal: 20 mg/dL (ref 5–40)

## 2018-12-10 LAB — PSA: Prostate Specific Ag, Serum: 1.2 ng/mL (ref 0.0–4.0)

## 2018-12-29 ENCOUNTER — Encounter: Payer: Self-pay | Admitting: Family Medicine

## 2018-12-29 ENCOUNTER — Ambulatory Visit (INDEPENDENT_AMBULATORY_CARE_PROVIDER_SITE_OTHER): Payer: BC Managed Care – PPO | Admitting: Family Medicine

## 2018-12-29 ENCOUNTER — Other Ambulatory Visit: Payer: Self-pay

## 2018-12-29 VITALS — Ht 68.0 in

## 2018-12-29 DIAGNOSIS — Z7901 Long term (current) use of anticoagulants: Secondary | ICD-10-CM | POA: Diagnosis not present

## 2018-12-29 DIAGNOSIS — Z0000 Encounter for general adult medical examination without abnormal findings: Secondary | ICD-10-CM

## 2018-12-29 LAB — POCT INR: INR: 2 (ref 2.0–3.0)

## 2018-12-29 NOTE — Patient Instructions (Addendum)
Results for orders placed or performed in visit on 12/29/18  POCT INR  Result Value Ref Range   INR 2.0 2.0 - 3.0   Recent Results (from the past 2160 hour(s))  POCT INR     Status: None   Collection Time: 10/05/18  2:45 PM  Result Value Ref Range   INR 2.2 2.0 - 3.0  POCT INR     Status: None   Collection Time: 11/02/18  2:34 PM  Result Value Ref Range   INR 2.4 2.0 - 3.0  POCT INR     Status: Abnormal   Collection Time: 11/30/18  8:46 AM  Result Value Ref Range   INR 1.8 (A) 2.0 - 3.0  Lipid panel     Status: Abnormal   Collection Time: 12/09/18  8:46 AM  Result Value Ref Range   Cholesterol, Total 187 100 - 199 mg/dL   Triglycerides 110 0 - 149 mg/dL   HDL 42 >39 mg/dL   VLDL Cholesterol Cal 20 5 - 40 mg/dL   LDL Chol Calc (NIH) 125 (H) 0 - 99 mg/dL   Chol/HDL Ratio 4.5 0.0 - 5.0 ratio    Comment:                                   T. Chol/HDL Ratio                                             Men  Women                               1/2 Avg.Risk  3.4    3.3                                   Avg.Risk  5.0    4.4                                2X Avg.Risk  9.6    7.1                                3X Avg.Risk 23.4   11.0   Hepatic function panel     Status: None   Collection Time: 12/09/18  8:46 AM  Result Value Ref Range   Total Protein 6.9 6.0 - 8.5 g/dL   Albumin 4.5 3.7 - 4.7 g/dL   Bilirubin Total 0.4 0.0 - 1.2 mg/dL   Bilirubin, Direct 0.10 0.00 - 0.40 mg/dL   Alkaline Phosphatase 62 39 - 117 IU/L   AST 19 0 - 40 IU/L   ALT 17 0 - 44 IU/L  Basic metabolic panel     Status: None   Collection Time: 12/09/18  8:46 AM  Result Value Ref Range   Glucose 95 65 - 99 mg/dL   BUN 12 8 - 27 mg/dL   Creatinine, Ser 0.91 0.76 - 1.27 mg/dL   GFR calc non Af Amer 84 >59 mL/min/1.73   GFR calc Af Amer 97 >59 mL/min/1.73   BUN/Creatinine Ratio 13 10 - 24  Sodium 139 134 - 144 mmol/L   Potassium 4.1 3.5 - 5.2 mmol/L   Chloride 102 96 - 106 mmol/L   CO2 25 20 - 29 mmol/L    Calcium 9.2 8.6 - 10.2 mg/dL  PSA     Status: None   Collection Time: 12/09/18  8:46 AM  Result Value Ref Range   Prostate Specific Ag, Serum 1.2 0.0 - 4.0 ng/mL    Comment: Roche ECLIA methodology. According to the American Urological Association, Serum PSA should decrease and remain at undetectable levels after radical prostatectomy. The AUA defines biochemical recurrence as an initial PSA value 0.2 ng/mL or greater followed by a subsequent confirmatory PSA value 0.2 ng/mL or greater. Values obtained with different assay methods or kits cannot be used interchangeably. Results cannot be interpreted as absolute evidence of the presence or absence of malignant disease.   POCT INR     Status: None   Collection Time: 12/29/18  9:46 AM  Result Value Ref Range   INR 2.0 2.0 - 3.0   Continue same treatment. Return in 4 weeks for INR

## 2018-12-29 NOTE — Progress Notes (Signed)
Subjective:    Patient ID: [name]., male    DOB: [birth year], 73 y.o.   MRN: [id number]  HPI AWV- Annual Wellness Visit  The patient was seen for their annual wellness visit. The patient's past medical history, surgical history, and family history were reviewed. Pertinent vaccines were reviewed ( tetanus, pneumonia, shingles, flu) The patient's medication list was reviewed and updated.  The height and weight were entered.  BMI recorded in electronic record elsewhere  Cognitive screening was completed. Outcome of Mini - Cog: pass   Falls /depression screening electronically recorded within record elsewhere  None (All patients who use tobacco were given written and verbal information on quitting)  Recent listing of emergency department/hospitalizations over the past year were reviewed.  current specialist the patient sees on a regular basis: cardiologist   Medicare annual wellness visit patient questionnaire was reviewed.  A written screening schedule for the patient for the next 5-10 years was given. Appropriate discussion of followup regarding next visit was discussed.  Next colon due in five which is one yr for now   Exercising reg   Results for orders placed or performed in visit on 12/29/18  POCT INR  Result Value Ref Range   INR 2.0 2.0 - 3.0    Card tried three different   Chol meds    Multiple statins and all messed with him   Could nt tolerae   Review of Systems  Constitutional: Negative for activity change, appetite change and fever.  HENT: Negative for congestion and rhinorrhea.   Eyes: Negative for discharge.  Respiratory: Negative for cough and wheezing.   Cardiovascular: Negative for chest pain.  Gastrointestinal: Negative for abdominal pain, blood in stool and vomiting.  Genitourinary: Negative for difficulty urinating and frequency.  Musculoskeletal: Negative for neck pain.  Skin: Negative for rash.  Allergic/Immunologic: Negative  for environmental allergies and food allergies.  Neurological: Negative for weakness and headaches.  Psychiatric/Behavioral: Negative for agitation.       Objective:   Physical Exam Vitals signs reviewed.  Constitutional:      Appearance: He is well-developed.  HENT:     Head: Normocephalic and atraumatic.     Right Ear: External ear normal.     Left Ear: External ear normal.     Nose: Nose normal.  Eyes:     Pupils: Pupils are equal, round, and reactive to light.  Neck:     Musculoskeletal: Normal range of motion and neck supple.     Thyroid: No thyromegaly.  Cardiovascular:     Rate and Rhythm: Normal rate and regular rhythm.     Heart sounds: Normal heart sounds. No murmur.  Pulmonary:     Effort: Pulmonary effort is normal. No respiratory distress.     Breath sounds: Normal breath sounds. No wheezing.  Abdominal:     General: Bowel sounds are normal. There is no distension.     Palpations: Abdomen is soft. There is no mass.     Tenderness: There is no abdominal tenderness.  Genitourinary:    Penis: Normal.   Musculoskeletal: Normal range of motion.  Lymphadenopathy:     Cervical: No cervical adenopathy.  Skin:    General: Skin is warm and dry.     Findings: No erythema.  Neurological:     Mental Status: He is alert.     Motor: No abnormal muscle tone.  Psychiatric:        Behavior: Behavior normal.  Judgment: Judgment normal.           Assessment & Plan:  Impression wellness exam.  Diet discussed.  Exercise discussed.  Vaccines discussed and given where appropriate.  Up-to-date on colonoscopy. Due next year.  Of note INR good today continue same therapy follow-up in 1 month

## 2019-01-01 ENCOUNTER — Encounter: Payer: Self-pay | Admitting: Family Medicine

## 2019-01-31 ENCOUNTER — Other Ambulatory Visit: Payer: Self-pay

## 2019-01-31 ENCOUNTER — Other Ambulatory Visit (INDEPENDENT_AMBULATORY_CARE_PROVIDER_SITE_OTHER): Payer: BC Managed Care – PPO | Admitting: *Deleted

## 2019-01-31 DIAGNOSIS — Z7901 Long term (current) use of anticoagulants: Secondary | ICD-10-CM

## 2019-01-31 LAB — POCT INR: INR: 1.8 — AB (ref 2.0–3.0)

## 2019-01-31 MED ORDER — WARFARIN SODIUM 5 MG PO TABS: ORAL_TABLET | ORAL | 1 refills | Status: DC

## 2019-01-31 NOTE — Patient Instructions (Signed)
Coumadin 5mg  take one tablet every day except on tuesdays take one and a half tablets. Recheck INR in 4 weeks.

## 2019-02-28 ENCOUNTER — Other Ambulatory Visit: Payer: Medicare Other

## 2019-03-02 ENCOUNTER — Other Ambulatory Visit (INDEPENDENT_AMBULATORY_CARE_PROVIDER_SITE_OTHER): Payer: BC Managed Care – PPO | Admitting: *Deleted

## 2019-03-02 ENCOUNTER — Other Ambulatory Visit: Payer: Self-pay

## 2019-03-02 DIAGNOSIS — Z7901 Long term (current) use of anticoagulants: Secondary | ICD-10-CM | POA: Diagnosis not present

## 2019-03-02 LAB — POCT INR: INR: 3.1 — AB (ref 2.0–3.0)

## 2019-04-04 ENCOUNTER — Other Ambulatory Visit (INDEPENDENT_AMBULATORY_CARE_PROVIDER_SITE_OTHER): Payer: BC Managed Care – PPO | Admitting: *Deleted

## 2019-04-04 DIAGNOSIS — Z7901 Long term (current) use of anticoagulants: Secondary | ICD-10-CM | POA: Diagnosis not present

## 2019-04-04 LAB — POCT INR: INR: 2.6 (ref 2.0–3.0)

## 2019-04-04 NOTE — Patient Instructions (Signed)
Coumadin 5mg  take one every day except on tuesdays take one and a half tablets

## 2019-04-27 ENCOUNTER — Encounter: Payer: Self-pay | Admitting: Family Medicine

## 2019-05-02 ENCOUNTER — Other Ambulatory Visit (INDEPENDENT_AMBULATORY_CARE_PROVIDER_SITE_OTHER): Payer: BC Managed Care – PPO | Admitting: *Deleted

## 2019-05-02 ENCOUNTER — Other Ambulatory Visit: Payer: Self-pay

## 2019-05-02 DIAGNOSIS — Z7901 Long term (current) use of anticoagulants: Secondary | ICD-10-CM

## 2019-05-02 LAB — POCT INR
INR: 3.5 — AB (ref 2.0–3.0)
INR: 3.5 — AB (ref 2.0–3.0)

## 2019-05-18 DIAGNOSIS — Z23 Encounter for immunization: Secondary | ICD-10-CM | POA: Diagnosis not present

## 2019-05-30 ENCOUNTER — Other Ambulatory Visit: Payer: Self-pay

## 2019-05-30 ENCOUNTER — Other Ambulatory Visit (INDEPENDENT_AMBULATORY_CARE_PROVIDER_SITE_OTHER): Payer: BC Managed Care – PPO

## 2019-05-30 DIAGNOSIS — Z7901 Long term (current) use of anticoagulants: Secondary | ICD-10-CM | POA: Diagnosis not present

## 2019-05-30 LAB — POCT INR: INR: 2.4 (ref 2.0–3.0)

## 2019-05-30 NOTE — Patient Instructions (Signed)
Continue same treatment; recheck in 4 weeks.  °

## 2019-06-16 DIAGNOSIS — Z23 Encounter for immunization: Secondary | ICD-10-CM | POA: Diagnosis not present

## 2019-06-27 ENCOUNTER — Other Ambulatory Visit: Payer: Medicare Other

## 2019-06-28 ENCOUNTER — Other Ambulatory Visit (INDEPENDENT_AMBULATORY_CARE_PROVIDER_SITE_OTHER): Payer: BC Managed Care – PPO | Admitting: *Deleted

## 2019-06-28 ENCOUNTER — Other Ambulatory Visit: Payer: Self-pay

## 2019-06-28 DIAGNOSIS — Z7901 Long term (current) use of anticoagulants: Secondary | ICD-10-CM

## 2019-06-28 LAB — POCT INR: INR: 3 (ref 2.0–3.0)

## 2019-06-28 NOTE — Patient Instructions (Signed)
Coumadin 5mg  tablets. Take one tablet daily and recheck INR in 4 weeks

## 2019-07-26 ENCOUNTER — Other Ambulatory Visit: Payer: Self-pay

## 2019-07-26 ENCOUNTER — Other Ambulatory Visit (INDEPENDENT_AMBULATORY_CARE_PROVIDER_SITE_OTHER): Payer: BC Managed Care – PPO | Admitting: *Deleted

## 2019-07-26 DIAGNOSIS — Z7901 Long term (current) use of anticoagulants: Secondary | ICD-10-CM

## 2019-07-26 LAB — POCT INR: INR: 2.5 (ref 2.0–3.0)

## 2019-07-27 DIAGNOSIS — H34231 Retinal artery branch occlusion, right eye: Secondary | ICD-10-CM | POA: Diagnosis not present

## 2019-07-27 DIAGNOSIS — H401212 Low-tension glaucoma, right eye, moderate stage: Secondary | ICD-10-CM | POA: Diagnosis not present

## 2019-07-27 DIAGNOSIS — H40022 Open angle with borderline findings, high risk, left eye: Secondary | ICD-10-CM | POA: Diagnosis not present

## 2019-07-27 DIAGNOSIS — H35033 Hypertensive retinopathy, bilateral: Secondary | ICD-10-CM | POA: Diagnosis not present

## 2019-08-08 ENCOUNTER — Telehealth: Payer: Self-pay | Admitting: Family Medicine

## 2019-08-08 NOTE — Telephone Encounter (Signed)
Please advise. Thank you

## 2019-08-08 NOTE — Telephone Encounter (Signed)
Patient was referred to [facility] and [employer] is not in network for them. His wife goes to Dr Jena Gauss and he needs to have a referral to Dr Jena Gauss since it has been awhile. This will be for his colonoscopy.

## 2019-08-09 ENCOUNTER — Other Ambulatory Visit: Payer: Self-pay | Admitting: *Deleted

## 2019-08-09 DIAGNOSIS — Z1211 Encounter for screening for malignant neoplasm of colon: Secondary | ICD-10-CM

## 2019-08-09 NOTE — Telephone Encounter (Signed)
Let's do 

## 2019-08-10 ENCOUNTER — Encounter: Payer: Self-pay | Admitting: Family Medicine

## 2019-08-15 ENCOUNTER — Telehealth: Payer: Self-pay | Admitting: Cardiology

## 2019-08-15 NOTE — Telephone Encounter (Signed)
  Patient Consent for Virtual Visit         [name]. has provided verbal consent on 08/15/2019 for a virtual visit (video or telephone).   CONSENT FOR VIRTUAL VISIT FOR:  [name].  By participating in this virtual visit I agree to the following:  I hereby voluntarily request, consent and authorize [facility] and its employed or contracted physicians, physician assistants, nurse practitioners or other licensed health care professionals (the Practitioner), to provide me with telemedicine health care services (the "Services") as deemed necessary by the treating Practitioner. I acknowledge and consent to receive the Services by the Practitioner via telemedicine. I understand that the telemedicine visit will involve communicating with the Practitioner through live audiovisual communication technology and the disclosure of certain medical information by electronic transmission. I acknowledge that I have been given the opportunity to request an in-person assessment or other available alternative prior to the telemedicine visit and am voluntarily participating in the telemedicine visit.  I understand that I have the right to withhold or withdraw my consent to the use of telemedicine in the course of my care at any time, without affecting my right to future care or treatment, and that the Practitioner or I may terminate the telemedicine visit at any time. I understand that I have the right to inspect all information obtained and/or recorded in the course of the telemedicine visit and may receive copies of available information for a reasonable fee.  I understand that some of the potential risks of receiving the Services via telemedicine include:  Marland Kitchen Delay or interruption in medical evaluation due to technological equipment failure or disruption; . Information transmitted may not be sufficient (e.g. poor resolution of images) to allow for appropriate medical decision making by the  Practitioner; and/or  . In rare instances, security protocols could fail, causing a breach of personal health information.  Furthermore, I acknowledge that it is my responsibility to provide information about my medical history, conditions and care that is complete and accurate to the best of my ability. I acknowledge that Practitioner's advice, recommendations, and/or decision may be based on factors not within their control, such as incomplete or inaccurate data provided by me or distortions of diagnostic images or specimens that may result from electronic transmissions. I understand that the practice of medicine is not an exact science and that Practitioner makes no warranties or guarantees regarding treatment outcomes. I acknowledge that a copy of this consent can be made available to me via my patient portal [facility] MyChart), or I can request a printed copy by calling the office of [facility].    I understand that my insurance will be billed for this visit.   I have read or had this consent read to me. . I understand the contents of this consent, which adequately explains the benefits and risks of the Services being provided via telemedicine.  . I have been provided ample opportunity to ask questions regarding this consent and the Services and have had my questions answered to my satisfaction. . I give my informed consent for the services to be provided through the use of telemedicine in my medical care

## 2019-08-16 ENCOUNTER — Encounter: Payer: Self-pay | Admitting: Internal Medicine

## 2019-08-18 ENCOUNTER — Telehealth (INDEPENDENT_AMBULATORY_CARE_PROVIDER_SITE_OTHER): Payer: Self-pay | Admitting: Cardiology

## 2019-08-18 ENCOUNTER — Encounter: Payer: Self-pay | Admitting: Cardiology

## 2019-08-18 VITALS — Ht 68.0 in | Wt 164.0 lb

## 2019-08-18 DIAGNOSIS — E782 Mixed hyperlipidemia: Secondary | ICD-10-CM

## 2019-08-18 DIAGNOSIS — I2510 Atherosclerotic heart disease of native coronary artery without angina pectoris: Secondary | ICD-10-CM

## 2019-08-18 MED ORDER — EZETIMIBE 10 MG PO TABS: 10.0000 mg | ORAL_TABLET | Freq: Every day | ORAL | 1 refills | Status: DC

## 2019-08-18 NOTE — Progress Notes (Signed)
Virtual Visit via Telephone Note   This visit type was conducted due to national recommendations for restrictions regarding the COVID-19 Pandemic (e.g. social distancing) in an effort to limit this patient's exposure and mitigate transmission in our community.  Due to his co-morbid illnesses, this patient is at least at moderate risk for complications without adequate follow up.  This format is felt to be most appropriate for this patient at this time.  The patient did not have access to video technology/had technical difficulties with video requiring transitioning to audio format only (telephone).  All issues noted in this document were discussed and addressed.  No physical exam could be performed with this format.  Please refer to the patient's chart for his  consent to telehealth for [facility].   The patient was identified using 2 identifiers.  Date:  08/18/2019   ID:  [name]., DOB [birth year], MRN [id number]  Patient Location: Home Provider Location: Office  PCP:  Merlyn Albert, MD  Cardiologist:  Dina Rich, MD  Electrophysiologist:  None   Evaluation Performed:  Follow-Up Visit  Chief Complaint:  Follow up visit  History of Present Illness:    Prudencio Velazco. is a 74 y.o. male seen today for follow up of the following medical problems.   1. CAD - admit Jan 2019 with subcascpular pain, found to have NSTEMI peak trop 31 - cath Jan 2019 staged PCI LCX and LAD.  - plans were for triple therapy withasa, plavix, coumdin (for history of PE) x 1 month, then plavix and coumadin - Jan 2019 echo LVEF 60-65%, no wma's, grade I diatolic dysfunction.  - previously came off beta blocker and statin due to diarrhea. He tried taking pravastatin low dose and did so for a few months but had recurrent diarrhea and ended up stopping   - no recent chest pain, no SOB/DOE - compliant with meds  2. History of bilateral PE - occurred after immobility after  back surgeryin 10/2017 - 09/2013 LE US showed chronic DVT left femoral vein - 06/2017 CT PE no PE - 06/2017 LE venous US: left popliteal vein DVT new compared to 09/2013 study. Chronic DVT left femoral and tibial veins. Chronic DVT left deep femoral vein.  - on life long coumadin from notes, notes indicate this was recommended by hematology in the past -seen again by hematology, decision made to continue anticoagulation.  - no recent bleeding on coumadin   3. 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    Past Medical History:  Diagnosis Date  . Anticoagulated on Coumadin   . Avascular necrosis of hip (HCC)    LEFT  . DVT (deep venous thrombosis) (HCC) 2009  . Dyslipidemia, goal LDL below 70 04/09/2017  . Family history of anesthesia complication    BROTHER HAD MALIGNANT HYPOTHERMIA - PT HAS NOT HAD ANY PROBLEMS WITH ANESTHESIA    . History of kidney stones   . Malignant hyperthermia    PT'S BROTHER HAS HX MALIGNANT HYPERTHERMIA  . NSTEMI (non-ST elevated myocardial infarction) (HCC) 04/09/2017  . Pulmonary embolism (HCC) 2009  . Vertigo    NO RECENT PROBLEMS   Past Surgical History:  Procedure Laterality Date  . BACK SURGERY  2009   DISCECTOMY  .  CERVICAL FUSION  2008  . CORONARY STENT INTERVENTION N/A 04/09/2017   Procedure: CORONARY STENT INTERVENTION;  Surgeon: Yvonne Kendall, MD;  Location: [facility];  Service: Cardiovascular;  Laterality: N/A;  . CORONARY STENT INTERVENTION N/A 04/12/2017   Procedure: CORONARY STENT INTERVENTION;  Surgeon: Yvonne Kendall, MD;  Location: [facility];  Service: Cardiovascular;  Laterality: N/A;  . INTRAVASCULAR ULTRASOUND/IVUS N/A 04/12/2017   Procedure: Intravascular Ultrasound/IVUS;  Surgeon: Yvonne Kendall, MD;  Location: [facility];  Service: Cardiovascular;  Laterality: N/A;  . LEFT HEART CATH AND CORONARY ANGIOGRAPHY N/A 04/09/2017   Procedure: LEFT HEART CATH AND CORONARY ANGIOGRAPHY;  Surgeon: Yvonne Kendall, MD;  Location: [facility];  Service: Cardiovascular;  Laterality: N/A;  . TOTAL HIP ARTHROPLASTY Right 12/22/2013   Procedure: RIGHT TOTAL HIP ARTHROPLASTY ANTERIOR APPROACH;  Surgeon: Shelda Pal, MD;  Location: [facility];  Service: Orthopedics;  Laterality: Right;  . TOTAL HIP ARTHROPLASTY Left 04/30/2015   Procedure: LEFT TOTAL HIP ARTHROPLASTY ANTERIOR APPROACH;  Surgeon: Durene Romans, MD;  Location: [facility];  Service: Orthopedics;  Laterality: Left;     Current Meds  Medication Sig  . acetaminophen (TYLENOL) 500 MG tablet Take 2 tablets (1,000 mg total) by mouth every 8 (eight) hours.  Marland Kitchen lisinopril (ZESTRIL) 5 MG tablet Take 1 tablet (5 mg total) by mouth daily.  . nitroGLYCERIN (NITROSTAT) 0.4 MG SL tablet 1 TAB UNDER TONGUE EVERY 5 MINUTES X3 DOSES AS NEED CHEST PAIN. IF NO RELIEF AFTER 1ST DOSE GO TO ER  . sildenafil (REVATIO) 20 MG tablet Take 2-3 tablets (40-60 mg total) by mouth as needed. 30 minutes before intercourse  . warfarin (COUMADIN) 5 MG tablet TAKE ONE AND A HALF ON TUESDAYS AND ONE TABLET ALL OTHER DAYS     Allergies:   Lyrica [pregabalin]   Social History   Tobacco Use  . Smoking status: Never Smoker  . Smokeless tobacco: Never Used  Substance Use Topics  . Alcohol use: No  . Drug use: No     Family Hx: The patient's family history includes Breast cancer in his mother; Cancer in his brother; Cirrhosis in his sister; Diabetes in his paternal grandmother; Heart attack (age of onset: 32) in his mother; Hyperlipidemia in his mother; Hypertension in his mother and paternal grandmother; Stroke in his mother.  ROS:   Please see the history of present illness.     All other systems reviewed and are negative.   Prior CV studies:     The following studies were reviewed today:  Jan 2019 cath Conclusions: 1. Significant two-vessel coronary artery disease with 80-90% mid LAD stenosis and occluded mid LCx. 2. Mild to moderate right coronary artery disease. Proximal and mid portions of the LAD and RCA are ectatic/aneurysmal. 3. Mildly to moderately reduced left ventricular contraction with mid anterolateral hypokinesis. Low normal LVEDP. 4. Successful PCI to mid LCx using Resolute Onyx 2.0 x 2.0 x 22 mm DES (post-dilated to 2.6 mm proximally) with 0% residual stenosis and TIMI-3 flow.  Recommendations: 1. Plan for staged PCI to LAD next week, as renal function allows. 2. ASA and ticagrelor given today; recommend switching ticagrelor to clopidogrel prior to discharge. Continue warfarin, clopidogrel, and aspirin x 1 month, then discontinue aspirin and complete at least 12 months of warfarin and clopidogrel. 3. Aggressive secondary prevention.   Jan 2019 cath staged PCI Conclusions: 1. Aneurysmal LAD with sequential moderate to severe stenosis involving the proximal and mid portions of  the vessel. 2. Patent stent in the mid LCx. 3. Successful IVUS-guided PCI to the mid LAD with placement of non-overlapping Resolute Onyx 3.5 x 8 mm (proximal) and Resolute Onyx 2.75 x 38 mm (distal) drug-eluting stents with 0% residual stenosis and TIMI-3 flow.  Recommendations: 1. Transition from ticagrelor to clopidogrel; will load with clopidogrel 300 mg x 1 tomorrow morning, followed by 75 mg daily thereafter. 2. Resume warfarin per pharmacy tonight. Patient and his family report that he has been bridged in the past for procedures requiring cessation of warfarin. If there is no evidence of bleeding or vascular injury tomorrow, Lovenox bridge should be started tomorrow morning per pharmacy. 3. Continue aspirin, clopidogrel, and warfarin x 1 month. If INR therapeutic/stable, aspirin can be discontinued at that time. 4. Aggressive secondary  prevention.  Jan 2019 echo Study Conclusions  - Left ventricle: The cavity size was normal. Wall thickness was normal. Systolic function was normal. The estimated ejection fraction was in the range of 60% to 65%. Wall motion was normal; there were no regional wall motion abnormalities. Doppler parameters are consistent with abnormal left ventricular relaxation (grade 1 diastolic dysfunction). The E/e&' ratio is between 8-15, suggesting indeterminate LV filling pressure. - Aortic valve: Trileaflet. Sclerosis without stenosis. There was trivial regurgitation. - Mitral valve: Mildly thickened leaflets . There was trivial regurgitation. - Left atrium: The atrium was mildly dilated. - Tricuspid valve: There was trivial regurgitation. - Pulmonary arteries: PA peak pressure: 18 mm Hg (S). - Inferior vena cava: The vessel was normal in size. The respirophasic diameter changes were in the normal range (>= 50%), consistent with normal central venous pressure.  Impressions:  - LVEF 60-65%, normal wall thickness, normal wall motion, grade 1 DD, indeterminate LV filling pressure, aortic valve sclerosis with trivial AI, trivial MR, mild LAE, trivial TR, RVSP 18 mmHg, normal IVC.  Labs/Other Tests and Data Reviewed:    EKG:  No ECG reviewed.  Recent Labs: 12/09/2018: ALT 17; BUN 12; Creatinine, Ser 0.91; Potassium 4.1; Sodium 139   Recent Lipid Panel Lab Results  Component Value Date/Time   CHOL 187 12/09/2018 08:46 AM   TRIG 110 12/09/2018 08:46 AM   HDL 42 12/09/2018 08:46 AM   CHOLHDL 4.5 12/09/2018 08:46 AM   CHOLHDL 3.6 04/10/2017 05:39 AM   LDLCALC 125 (H) 12/09/2018 08:46 AM    Wt Readings from Last 3 Encounters:  08/18/19 164 lb (74.4 kg)  11/10/18 166 lb (75.3 kg)  05/11/18 167 lb (75.8 kg)     Objective:    Vital Signs:  Ht 5\' 8"  (1.727 m)   Wt 164 lb (74.4 kg)   BMI 24.94 kg/m    Normal affect. Normal speech pattern and tone.  Comfortable, no apparent distress. No audible signs of sob or wheezing.   ASSESSMENT & PLAN:    1. CAD - He stopped beta blocker and statin due to diarrhea. We tried an alternative statin but recurrent symptoms - no recent symptoms, continue to monitor.   2. History of DVT/PE - continue coumadin  3. Hyperlipidemia - did not tolerate statins, LDL above goal given his CAD history. Will try zetia 10mg  daily.   COVID-19 Education: The signs and symptoms of COVID-19 were discussed with the patient and how to seek care for testing (follow up with PCP or arrange E-visit).  The importance of social distancing was discussed today.  Time:   Today, I have spent 16 minutes with the patient with telehealth technology discussing the above problems.  Medication Adjustments/Labs and Tests Ordered: Current medicines are reviewed at length with the patient today.  Concerns regarding medicines are outlined above.   Tests Ordered: No orders of the defined types were placed in this encounter.   Medication Changes: No orders of the defined types were placed in this encounter.   Follow Up:  In Person in 6 month(s)  Signed, Carlyle Dolly, MD  08/18/2019 8:15 AM    [place]

## 2019-08-18 NOTE — Addendum Note (Signed)
Addended by: Burman Nieves T on: 08/18/2019 09:01 AM   Modules accepted: Orders

## 2019-08-18 NOTE — Patient Instructions (Signed)
Your physician wants you to follow-up in: 6 MONTHS WITH DR BRANCH You will receive a reminder letter in the mail two months in advance. If you don't receive a letter, please call our office to schedule the follow-up appointment.  Your physician has recommended you make the following change in your medication:   START ZETIA 10 MG DAILY   Thank you for choosing [facility]!!       

## 2019-08-24 ENCOUNTER — Other Ambulatory Visit (INDEPENDENT_AMBULATORY_CARE_PROVIDER_SITE_OTHER): Payer: BC Managed Care – PPO

## 2019-08-24 ENCOUNTER — Other Ambulatory Visit: Payer: Self-pay

## 2019-08-24 DIAGNOSIS — Z7901 Long term (current) use of anticoagulants: Secondary | ICD-10-CM

## 2019-08-24 LAB — POCT INR: INR: 3.7 — AB (ref 2.0–3.0)

## 2019-08-31 ENCOUNTER — Other Ambulatory Visit (INDEPENDENT_AMBULATORY_CARE_PROVIDER_SITE_OTHER): Payer: BC Managed Care – PPO

## 2019-08-31 ENCOUNTER — Other Ambulatory Visit: Payer: Self-pay

## 2019-08-31 DIAGNOSIS — Z7901 Long term (current) use of anticoagulants: Secondary | ICD-10-CM

## 2019-08-31 LAB — POCT INR: INR: 2.8 (ref 2.0–3.0)

## 2019-08-31 NOTE — Patient Instructions (Signed)
Take 5 mg tablets each day. Follow up in 4 weeks

## 2019-09-28 ENCOUNTER — Other Ambulatory Visit: Payer: Self-pay

## 2019-09-28 ENCOUNTER — Other Ambulatory Visit: Payer: Medicare Other

## 2019-09-28 ENCOUNTER — Other Ambulatory Visit (INDEPENDENT_AMBULATORY_CARE_PROVIDER_SITE_OTHER): Payer: BC Managed Care – PPO | Admitting: *Deleted

## 2019-09-28 DIAGNOSIS — Z7901 Long term (current) use of anticoagulants: Secondary | ICD-10-CM

## 2019-09-28 LAB — POCT INR: INR: 3.3 — AB (ref 2.0–3.0)

## 2019-10-13 ENCOUNTER — Other Ambulatory Visit: Payer: Self-pay | Admitting: *Deleted

## 2019-10-16 MED ORDER — WARFARIN SODIUM 5 MG PO TABS: ORAL_TABLET | ORAL | 1 refills | Status: DC

## 2019-10-16 NOTE — Telephone Encounter (Signed)
cpe scheduled 10/11 and will need lab work. Pt states he can pick up lab work when he comes in for INR next month

## 2019-10-16 NOTE — Telephone Encounter (Signed)
Scheduled cpe 9/8.   Pt will be out of medication Wednesday and is needing 5 or 10 day supply to [facility] in [place] and 90 day supply to mail order.

## 2019-10-20 ENCOUNTER — Other Ambulatory Visit: Payer: Self-pay | Admitting: *Deleted

## 2019-10-20 MED ORDER — WARFARIN SODIUM 5 MG PO TABS: ORAL_TABLET | ORAL | 0 refills | Status: DC

## 2019-10-24 ENCOUNTER — Encounter: Payer: Self-pay | Admitting: Gastroenterology

## 2019-10-24 ENCOUNTER — Ambulatory Visit (INDEPENDENT_AMBULATORY_CARE_PROVIDER_SITE_OTHER): Payer: BLUE CROSS/BLUE SHIELD | Admitting: Gastroenterology

## 2019-10-24 ENCOUNTER — Other Ambulatory Visit: Payer: Self-pay

## 2019-10-24 DIAGNOSIS — Z86010 Personal history of colonic polyps: Secondary | ICD-10-CM | POA: Diagnosis not present

## 2019-10-24 NOTE — Patient Instructions (Signed)
We are arranging a colonoscopy with Propofol by Dr. Jena Gauss in the future: I will be discussing with anesthesia to ensure this is still appropriate due to your family history of malignant hyperthermia.  You will stay on Coumadin for the procedure.  Further recommendations to follow!   It was a pleasure to see you today. I want to create trusting relationships with patients to provide genuine, compassionate, and quality care. I value your feedback. If you receive a survey regarding your visit,  I greatly appreciate you taking time to fill this out.   Gelene Mink, PhD, ANP-BC [facility]

## 2019-10-24 NOTE — Progress Notes (Signed)
Primary Care Physician:  Annalee Genta, DO  Referring Physician: [name]  Primary Gastroenterologist:  Dr. Jena Gauss   Chief Complaint  Patient presents with  . Consult    TCS last done 5 years ago by [facility]. No concerns.    HPI:   [name]. is a 74 y.o. male presenting today at the request of [name] due to need for surveillance colonoscopy. Initially, normal colonoscopy in 2005 by Dr. Madilyn Fireman, 2010 colonoscopy by Dr. Jena Gauss with colon polyps but no path available and recommended 5-year-surveillance. Apparently, his last colonoscopy was in 2016 by Dr. Madilyn Fireman. He desires to continue care here with Dr. Jena Gauss who has done his procedures prior; his wife has also had procedures done here. His procedures in the past were done here on Coumadin due to history of PE/DVT.   No abdominal pain, N/V, no changes in appetite, no weight loss, no dysphagia, no GERD. No melena or hematochezia. He does have a family history of malignant hyperthermia (brother).    Past Medical History:  Diagnosis Date  . Anticoagulated on Coumadin   . Avascular necrosis of hip (HCC)    LEFT  . DVT (deep venous thrombosis) (HCC) 2009  . Dyslipidemia, goal LDL below 70 04/09/2017  . Family history of anesthesia complication    BROTHER HAD MALIGNANT HYPOTHERMIA - PT HAS NOT HAD ANY PROBLEMS WITH ANESTHESIA    . History of kidney stones   . Malignant hyperthermia    PT'S BROTHER HAS HX MALIGNANT HYPERTHERMIA  . NSTEMI (non-ST elevated myocardial infarction) (HCC) 04/09/2017  . Pulmonary embolism (HCC) 2009  . Vertigo    NO RECENT PROBLEMS    Past Surgical History:  Procedure Laterality Date  . BACK SURGERY  2009   DISCECTOMY  . CERVICAL FUSION  2008  . COLONOSCOPY  2005   Dr. Madilyn Fireman: normal  . COLONOSCOPY  2010   Dr. Jena Gauss, +Polyps but no path available. Surveillance 5 years  . COLONOSCOPY  2016   Dr. Madilyn Fireman  . CORONARY STENT INTERVENTION N/A 04/09/2017   Procedure: CORONARY STENT  INTERVENTION;  Surgeon: Yvonne Kendall, MD;  Location: [facility];  Service: Cardiovascular;  Laterality: N/A;  . CORONARY STENT INTERVENTION N/A 04/12/2017   Procedure: CORONARY STENT INTERVENTION;  Surgeon: Yvonne Kendall, MD;  Location: [facility];  Service: Cardiovascular;  Laterality: N/A;  . INTRAVASCULAR ULTRASOUND/IVUS N/A 04/12/2017   Procedure: Intravascular Ultrasound/IVUS;  Surgeon: Yvonne Kendall, MD;  Location: [facility];  Service: Cardiovascular;  Laterality: N/A;  . LEFT HEART CATH AND CORONARY ANGIOGRAPHY N/A 04/09/2017   Procedure: LEFT HEART CATH AND CORONARY ANGIOGRAPHY;  Surgeon: Yvonne Kendall, MD;  Location: [facility];  Service: Cardiovascular;  Laterality: N/A;  . TOTAL HIP ARTHROPLASTY Right 12/22/2013   Procedure: RIGHT TOTAL HIP ARTHROPLASTY ANTERIOR APPROACH;  Surgeon: Shelda Pal, MD;  Location: [facility];  Service: Orthopedics;  Laterality: Right;  . TOTAL HIP ARTHROPLASTY Left 04/30/2015   Procedure: LEFT TOTAL HIP ARTHROPLASTY ANTERIOR APPROACH;  Surgeon: Durene Romans, MD;  Location: [facility];  Service: Orthopedics;  Laterality: Left;    Current Outpatient Medications  Medication Sig Dispense Refill  . acetaminophen (TYLENOL) 500 MG tablet Take 2 tablets (1,000 mg total) by mouth every 8 (eight) hours. 30 tablet 0  . ezetimibe (ZETIA) 10 MG tablet Take 1 tablet (10 mg total) by mouth daily. 90 tablet 1  . lisinopril (ZESTRIL) 5 MG tablet Take  1 tablet (5 mg total) by mouth daily. 30 tablet 11  . nitroGLYCERIN (NITROSTAT) 0.4 MG SL tablet 1 TAB UNDER TONGUE EVERY 5 MINUTES X3 DOSES AS NEED CHEST PAIN. IF NO RELIEF AFTER 1ST DOSE GO TO ER 25 tablet 3  . sildenafil (REVATIO) 20 MG tablet Take 2-3 tablets (40-60 mg total) by mouth as needed. 30 minutes before intercourse 30 tablet 6  . warfarin (COUMADIN) 5 MG tablet TAKE ONE TABLET DAILY 10 tablet 0   No current facility-administered medications for this visit.    Allergies as of  10/24/2019 - Review Complete 10/24/2019  Allergen Reaction Noted  . Lyrica [pregabalin]  04/15/2015    Family History  Problem Relation Age of Onset  . Breast cancer Mother   . Hypertension Mother   . Hyperlipidemia Mother   . Heart attack Mother 80  . Stroke Mother   . Hypertension Paternal Grandmother   . Diabetes Paternal Grandmother   . Cirrhosis Sister   . Cancer Brother   . Malignant hyperthermia Brother   . Colon cancer Neg Hx     Social History   Socioeconomic History  . Marital status: Married    Spouse name: Not on file  . Number of children: Not on file  . Years of education: Not on file  . Highest education level: Not on file  Occupational History  . Occupation: self employed, Fish farm manager  Tobacco Use  . Smoking status: Never Smoker  . Smokeless tobacco: Never Used  Vaping Use  . Vaping Use: Never used  Substance and Sexual Activity  . Alcohol use: No  . Drug use: No  . Sexual activity: Not on file  Other Topics Concern  . Not on file  Social History Narrative  . Not on file   Social Determinants of Health   Financial Resource Strain:   . Difficulty of Paying Living Expenses:   Food Insecurity:   . Worried About Programme researcher, broadcasting/film/video in the Last Year:   . Barista in the Last Year:   Transportation Needs:   . Freight forwarder (Medical):   Marland Kitchen Lack of Transportation (Non-Medical):   Physical Activity:   . Days of Exercise per Week:   . Minutes of Exercise per Session:   Stress:   . Feeling of Stress :   Social Connections:   . Frequency of Communication with Friends and Family:   . Frequency of Social Gatherings with Friends and Family:   . Attends Religious Services:   . Active Member of Clubs or Organizations:   . Attends Banker Meetings:   Marland Kitchen Marital Status:   Intimate Partner Violence:   . Fear of Current or Ex-Partner:   . Emotionally Abused:   Marland Kitchen Physically Abused:   . Sexually Abused:     Review  of Systems: Gen: Denies any fever, chills, fatigue, weight loss, lack of appetite.  CV: Denies chest pain, heart palpitations, peripheral edema, syncope.  Resp: Denies shortness of breath at rest or with exertion. Denies wheezing or cough.  GI: see HPI GU : Denies urinary burning, urinary frequency, urinary hesitancy MS: Denies joint pain, muscle weakness, cramps, or limitation of movement.  Derm: Denies rash, itching, dry skin Psych: Denies depression, anxiety, memory loss, and confusion Heme: Denies bruising, bleeding, and enlarged lymph nodes.  Physical Exam: BP 126/65   Pulse 69   Temp 97.6 F (36.4 C)   Ht 5\' 8"  (1.727 m)   Wt  169 lb 6.4 oz (76.8 kg)   BMI 25.76 kg/m  General:   Alert and oriented. Pleasant and cooperative. Well-nourished and well-developed.  Head:  Normocephalic and atraumatic. Eyes:  Without icterus, sclera clear and conjunctiva pink.  Ears:  Normal auditory acuity. Mouth:   Mask in place Lungs:  Clear to auscultation bilaterally. No wheezes, rales, or rhonchi. No distress.  Heart:  S1, S2 present without murmurs appreciated.  Abdomen:  +BS, soft, non-tender and non-distended. No HSM noted. No guarding or rebound. No masses appreciated.  Rectal:  Deferred  Msk:  Symmetrical without gross deformities. Normal posture. Extremities:  Without edema. Neurologic:  Alert and  oriented x4;  grossly normal neurologically. Skin:  Intact without significant lesions or rashes. Psych:  Alert and cooperative. Normal mood and affect.  ASSESSMENT: [name]. is a 74 y.o. male presenting today with history of polyps, last colonoscopy in 2016, due for surveillance now. He is on chronic Coumadin due to history of DVT/PE in remote past that occurred likely due to immobility prior to back surgery (per patient, who has seen Dr. Mariel Sleet in the past). Prior colonoscopies done ON Coumadin. He has no concerning upper or lower GI signs/symptoms.   He has a family history  (brother) with malignant hyperthermia but no personal history. I discussed with Dr. Alva Garnet, who has requested Endo Room 3; patient may proceed with colonoscopy here.   Will have him remain on Coumadin for procedure as he has done historically; he is aware that any larger polyps would require a second procedure with lovenox bridging prior.   PLAN:  Proceed with TCS with Dr. Jena Gauss in near future using Propofol: the risks, benefits, and alternatives have been discussed with the patient in detail. The patient states understanding and desires to proceed.  Endo Room 3 requested  Remaining on Coumadin  Further recommendations to follow  Gelene Mink, PhD, ANP-BC [facility]

## 2019-10-26 ENCOUNTER — Other Ambulatory Visit: Payer: Self-pay

## 2019-10-26 ENCOUNTER — Other Ambulatory Visit (INDEPENDENT_AMBULATORY_CARE_PROVIDER_SITE_OTHER): Payer: BC Managed Care – PPO | Admitting: *Deleted

## 2019-10-26 DIAGNOSIS — Z7901 Long term (current) use of anticoagulants: Secondary | ICD-10-CM | POA: Diagnosis not present

## 2019-10-26 LAB — POCT INR: INR: 2.2 (ref 2.0–3.0)

## 2019-11-10 ENCOUNTER — Telehealth: Payer: Self-pay | Admitting: *Deleted

## 2019-11-10 NOTE — Telephone Encounter (Signed)
Called patient. He is scheduled for TCS with propofol with Dr. Jena Gauss, asa 3, on 10/25 at 10:15am. Aware will need pre-op/covid test prior. Advised will mail this to him with his instructions. Confirmed address.

## 2019-11-13 ENCOUNTER — Encounter: Payer: Self-pay | Admitting: *Deleted

## 2019-11-28 ENCOUNTER — Other Ambulatory Visit (INDEPENDENT_AMBULATORY_CARE_PROVIDER_SITE_OTHER): Payer: BC Managed Care – PPO

## 2019-11-28 ENCOUNTER — Other Ambulatory Visit: Payer: Self-pay

## 2019-11-28 DIAGNOSIS — Z7901 Long term (current) use of anticoagulants: Secondary | ICD-10-CM | POA: Diagnosis not present

## 2019-11-28 LAB — POCT INR: INR: 2.2 (ref 2.0–3.0)

## 2019-11-28 NOTE — Patient Instructions (Signed)
Continue same treatment. Recheck in 4 weeks 

## 2019-11-29 ENCOUNTER — Encounter: Payer: Medicare Other | Admitting: Family Medicine

## 2019-12-05 ENCOUNTER — Other Ambulatory Visit: Payer: Self-pay | Admitting: Cardiology

## 2019-12-08 ENCOUNTER — Other Ambulatory Visit: Payer: Self-pay | Admitting: Cardiology

## 2019-12-26 ENCOUNTER — Other Ambulatory Visit: Payer: Self-pay

## 2019-12-26 ENCOUNTER — Other Ambulatory Visit: Payer: Self-pay | Admitting: Family Medicine

## 2019-12-26 ENCOUNTER — Other Ambulatory Visit (INDEPENDENT_AMBULATORY_CARE_PROVIDER_SITE_OTHER): Payer: BC Managed Care – PPO

## 2019-12-26 DIAGNOSIS — Z125 Encounter for screening for malignant neoplasm of prostate: Secondary | ICD-10-CM

## 2019-12-26 DIAGNOSIS — Z0000 Encounter for general adult medical examination without abnormal findings: Secondary | ICD-10-CM

## 2019-12-26 DIAGNOSIS — E785 Hyperlipidemia, unspecified: Secondary | ICD-10-CM | POA: Diagnosis not present

## 2019-12-26 DIAGNOSIS — Z7901 Long term (current) use of anticoagulants: Secondary | ICD-10-CM

## 2019-12-26 DIAGNOSIS — Z79899 Other long term (current) drug therapy: Secondary | ICD-10-CM | POA: Diagnosis not present

## 2019-12-26 DIAGNOSIS — H401211 Low-tension glaucoma, right eye, mild stage: Secondary | ICD-10-CM | POA: Diagnosis not present

## 2019-12-26 LAB — POCT INR: INR: 3.1 — AB (ref 2.0–3.0)

## 2019-12-26 NOTE — Patient Instructions (Signed)
Continue same treatment. Follow up in 8 weeks for next INR

## 2019-12-27 LAB — HEPATIC FUNCTION PANEL
ALT: 18 IU/L (ref 0–44)
AST: 21 IU/L (ref 0–40)
Albumin: 4.3 g/dL (ref 3.7–4.7)
Alkaline Phosphatase: 67 IU/L (ref 44–121)
Bilirubin Total: 0.7 mg/dL (ref 0.0–1.2)
Bilirubin, Direct: 0.15 mg/dL (ref 0.00–0.40)
Total Protein: 6.7 g/dL (ref 6.0–8.5)

## 2019-12-27 LAB — CBC WITH DIFFERENTIAL/PLATELET
Basophils Absolute: 0.1 10*3/uL (ref 0.0–0.2)
Basos: 1 %
EOS (ABSOLUTE): 0.2 10*3/uL (ref 0.0–0.4)
Eos: 3 %
Hematocrit: 39.3 % (ref 37.5–51.0)
Hemoglobin: 13 g/dL (ref 13.0–17.7)
Immature Grans (Abs): 0 10*3/uL (ref 0.0–0.1)
Immature Granulocytes: 0 %
Lymphocytes Absolute: 1.6 10*3/uL (ref 0.7–3.1)
Lymphs: 30 %
MCH: 29.7 pg (ref 26.6–33.0)
MCHC: 33.1 g/dL (ref 31.5–35.7)
MCV: 90 fL (ref 79–97)
Monocytes Absolute: 0.5 10*3/uL (ref 0.1–0.9)
Monocytes: 9 %
Neutrophils Absolute: 2.9 10*3/uL (ref 1.4–7.0)
Neutrophils: 57 %
Platelets: 192 10*3/uL (ref 150–450)
RBC: 4.37 x10E6/uL (ref 4.14–5.80)
RDW: 12.3 % (ref 11.6–15.4)
WBC: 5.2 10*3/uL (ref 3.4–10.8)

## 2019-12-27 LAB — BASIC METABOLIC PANEL
BUN/Creatinine Ratio: 14 (ref 10–24)
BUN: 14 mg/dL (ref 8–27)
CO2: 24 mmol/L (ref 20–29)
Calcium: 9.1 mg/dL (ref 8.6–10.2)
Chloride: 103 mmol/L (ref 96–106)
Creatinine, Ser: 1 mg/dL (ref 0.76–1.27)
GFR calc Af Amer: 86 mL/min/{1.73_m2} (ref >59)
GFR calc non Af Amer: 74 mL/min/{1.73_m2} (ref >59)
Glucose: 87 mg/dL (ref 65–99)
Potassium: 4.1 mmol/L (ref 3.5–5.2)
Sodium: 140 mmol/L (ref 134–144)

## 2019-12-27 LAB — LIPID PANEL
Chol/HDL Ratio: 4.5 ratio (ref 0.0–5.0)
Cholesterol, Total: 144 mg/dL (ref 100–199)
HDL: 32 mg/dL — ABNORMAL LOW (ref >39)
LDL Chol Calc (NIH): 88 mg/dL (ref 0–99)
Triglycerides: 134 mg/dL (ref 0–149)
VLDL Cholesterol Cal: 24 mg/dL (ref 5–40)

## 2019-12-27 LAB — PSA: Prostate Specific Ag, Serum: 2.5 ng/mL (ref 0.0–4.0)

## 2020-01-01 ENCOUNTER — Encounter: Payer: Self-pay | Admitting: Family Medicine

## 2020-01-01 ENCOUNTER — Ambulatory Visit (INDEPENDENT_AMBULATORY_CARE_PROVIDER_SITE_OTHER): Payer: BC Managed Care – PPO | Admitting: Family Medicine

## 2020-01-01 VITALS — BP 118/72 | HR 89 | Temp 97.6°F | Ht 68.0 in | Wt 170.0 lb

## 2020-01-01 DIAGNOSIS — E785 Hyperlipidemia, unspecified: Secondary | ICD-10-CM

## 2020-01-01 DIAGNOSIS — Z0000 Encounter for general adult medical examination without abnormal findings: Secondary | ICD-10-CM | POA: Diagnosis not present

## 2020-01-01 DIAGNOSIS — Z7901 Long term (current) use of anticoagulants: Secondary | ICD-10-CM | POA: Diagnosis not present

## 2020-01-01 DIAGNOSIS — I214 Non-ST elevation (NSTEMI) myocardial infarction: Secondary | ICD-10-CM

## 2020-01-01 DIAGNOSIS — Z86711 Personal history of pulmonary embolism: Secondary | ICD-10-CM

## 2020-01-01 NOTE — Progress Notes (Signed)
Patient ID: [name]., male    DOB: [birth year], 74 y.o.   MRN: [id number]   Chief Complaint  Patient presents with  . Annual Exam   Subjective:    HPI  AWV- Annual Wellness Visit  The patient was seen for their annual wellness visit. The patient's past medical history, surgical history, and family history were reviewed. Pertinent vaccines were reviewed ( tetanus, pneumonia, shingles, flu) The patient's medication list was reviewed and updated.  The height and weight were entered.  BMI recorded in electronic record elsewhere  Cognitive screening was completed. Outcome of Mini - Cog: pass   Falls /depression screening electronically recorded within record elsewhere  Current tobacco usage:none (All patients who use tobacco were given written and verbal information on quitting)  Recent listing of emergency department/hospitalizations over the past year were reviewed.  current specialist the patient sees on a regular basis: none   Medicare annual wellness visit patient questionnaire was reviewed.  A written screening schedule for the patient for the next 5-10 years was given. Appropriate discussion of followup regarding next visit was discussed.   Pt seen by eye doctor has h/o cataract surgery.   Has colonoscopy scheduled in 2 wks. Following it every 5 yrs and has h/o polyps.  Pt taking zetia for HLD.  Doing well with this medication.  Pt has h/o psa elevated in past.  Normal on last set labs.   Pt taking coumadin and seeing hem/onc in past. H/o PE and life long anticoagulation.  Not needing to stop coumadin for the colonscopy.  Pt stating wanting to continue to do monthly INR checks.  Not feeling comfortable going out every 2 months.   Medical History Katherine has a past medical history of Anticoagulated on Coumadin, Avascular necrosis of hip (HCC), DVT (deep venous thrombosis) (HCC) (2009), Dyslipidemia, goal LDL below 70 (04/09/2017), Family history of  anesthesia complication, History of kidney stones, Malignant hyperthermia, NSTEMI (non-ST elevated myocardial infarction) (HCC) (04/09/2017), Pulmonary embolism (HCC) (2009), and Vertigo.   Outpatient Encounter Medications as of 01/01/2020  Medication Sig  . acetaminophen (TYLENOL) 500 MG tablet Take 2 tablets (1,000 mg total) by mouth every 8 (eight) hours.  Marland Kitchen ezetimibe (ZETIA) 10 MG tablet Take 1 tablet (10 mg total) by mouth daily.  Marland Kitchen lisinopril (ZESTRIL) 5 MG tablet TAKE 1 TABLET BY MOUTH EVERY DAY  . nitroGLYCERIN (NITROSTAT) 0.4 MG SL tablet 1 TAB UNDER TONGUE EVERY 5 MINUTES X3 DOSES AS NEED CHEST PAIN. IF NO RELIEF AFTER 1ST DOSE GO TO ER  . sildenafil (REVATIO) 20 MG tablet TAKE 2 TO 3 TABLETS BY MOUTH AS NEEDED 30 MINUTES BEFORE INTERCOURSE  . warfarin (COUMADIN) 5 MG tablet TAKE ONE TABLET DAILY   No facility-administered encounter medications on file as of 01/01/2020.     Review of Systems  Constitutional: Negative for chills and fever.  HENT: Negative for congestion, rhinorrhea and sore throat.   Respiratory: Negative for cough, shortness of breath and wheezing.   Cardiovascular: Negative for chest pain and leg swelling.  Gastrointestinal: Negative for abdominal pain, diarrhea, nausea and vomiting.  Genitourinary: Negative for dysuria and frequency.  Skin: Negative for rash.  Neurological: Negative for dizziness, weakness and headaches.      Vitals BP 118/72   Pulse 89   Temp 97.6 F (36.4 C) (Oral)   Ht 5\' 8"  (1.727 m)   Wt 170 lb (77.1 kg)   SpO2 99%   BMI 25.85 kg/m   Objective:  Physical Exam Vitals and nursing note reviewed.  Constitutional:      General: He is not in acute distress.    Appearance: Normal appearance. He is not ill-appearing.  HENT:     Head: Normocephalic.     Ears:     Comments: +wearing hearing aids    Nose: Nose normal. No congestion.     Mouth/Throat:     Mouth: Mucous membranes are moist.     Pharynx: No oropharyngeal  exudate.  Eyes:     Extraocular Movements: Extraocular movements intact.     Conjunctiva/sclera: Conjunctivae normal.     Pupils: Pupils are equal, round, and reactive to light.  Cardiovascular:     Rate and Rhythm: Normal rate and regular rhythm.     Pulses: Normal pulses.     Heart sounds: Normal heart sounds. No murmur heard.   Pulmonary:     Effort: Pulmonary effort is normal.     Breath sounds: Normal breath sounds. No wheezing, rhonchi or rales.  Abdominal:     General: Abdomen is flat. Bowel sounds are normal. There is no distension.     Palpations: Abdomen is soft. There is no mass.     Tenderness: There is no abdominal tenderness. There is no guarding or rebound.     Hernia: No hernia is present.  Musculoskeletal:        General: Normal range of motion.     Cervical back: Normal range of motion.     Right lower leg: No edema.     Left lower leg: No edema.  Skin:    General: Skin is warm and dry.     Findings: No rash.  Neurological:     General: No focal deficit present.     Mental Status: He is alert and oriented to person, place, and time.     Cranial Nerves: No cranial nerve deficit.  Psychiatric:        Mood and Affect: Mood normal.        Behavior: Behavior normal.        Thought Content: Thought content normal.        Judgment: Judgment normal.      Assessment and Plan   1. Medicare annual wellness visit, subsequent  2. Long term (current) use of anticoagulants  3. Dyslipidemia, goal LDL below 70  4. NSTEMI (non-ST elevated myocardial infarction) (HCC)  5. History of pulmonary embolism    H/o PE- life long anticoagulation. Pt requesting to continue to do monthly INR checks.  Not wanting to go out to q16mo checks.  CAD/H/o nstemi- 2019- stable, cont meds.  seeing Dr. Wyline Mood, cards.  HLD- pt intolerant to statins.  Has tried 3 in the past.  taking zetia and stable.  HM- getting colonoscopy in 2 wks.  Had flu vaccine and covid vaccine  already.  F/u 1 yr or prn.

## 2020-01-10 ENCOUNTER — Encounter (HOSPITAL_COMMUNITY): Payer: Self-pay

## 2020-01-10 NOTE — Patient Instructions (Signed)
[name].  01/10/2020     @PREFPERIOPPHARMACY @   Your procedure is scheduled on  01/15/2020.  Report to 01/17/2020 at  0845  A.M.  Call this number if you have problems the morning of surgery:  [phone]   Remember:  Follow the diet and prep instructions given to you by the office.                   Take these medicines the morning of surgery with A SIP OF WATER  None    Do not wear jewelry, make-up or nail polish.  Do not wear lotions, powders, or perfumes. Please wear deodorant and brush your teeth. Do not shave 48 hours prior to surgery.  Men may shave face and neck.  Do not bring valuables to the hospital.  [facility] is not responsible for any belongings or valuables.  Contacts, dentures or bridgework may not be worn into surgery.  Leave your suitcase in the car.  After surgery it may be brought to your room.  For patients admitted to the hospital, discharge time will be determined by your treatment team.  Patients discharged the day of surgery will not be allowed to drive home.   Name and phone number of your driver:   family Special instructions:   DO NOT smoke the morning of your procedure.  Please read over the following fact sheets that you were given. Anesthesia Post-op Instructions and Care and Recovery After Surgery       Colonoscopy, Adult, Care After This sheet gives you information about how to care for yourself after your procedure. Your health care provider may also give you more specific instructions. If you have problems or questions, contact your health care provider. What can I expect after the procedure? After the procedure, it is common to have:  A small amount of blood in your stool for 24 hours after the procedure.  Some gas.  Mild cramping or bloating of your abdomen. Follow these instructions at home: Eating and drinking   Drink enough fluid to keep your urine pale yellow.  Follow instructions from your health  care provider about eating or drinking restrictions.  Resume your normal diet as instructed by your health care provider. Avoid heavy or fried foods that are hard to digest. Activity  Rest as told by your health care provider.  Avoid sitting for a long time without moving. Get up to take short walks every 1-2 hours. This is important to improve blood flow and breathing. Ask for help if you feel weak or unsteady.  Return to your normal activities as told by your health care provider. Ask your health care provider what activities are safe for you. Managing cramping and bloating   Try walking around when you have cramps or feel bloated.  Apply heat to your abdomen as told by your health care provider. Use the heat source that your health care provider recommends, such as a moist heat pack or a heating pad. ? Place a towel between your skin and the heat source. ? Leave the heat on for 20-30 minutes. ? Remove the heat if your skin turns bright red. This is especially important if you are unable to feel pain, heat, or cold. You may have a greater risk of getting burned. General instructions  For the first 24 hours after the procedure: ? Do not drive or use machinery. ? Do not sign important documents. ? Do  not drink alcohol. ? Do your regular daily activities at a slower pace than normal. ? Eat soft foods that are easy to digest.  Take over-the-counter and prescription medicines only as told by your health care provider.  Keep all follow-up visits as told by your health care provider. This is important. Contact a health care provider if:  You have blood in your stool 2-3 days after the procedure. Get help right away if you have:  More than a small spotting of blood in your stool.  Large blood clots in your stool.  Swelling of your abdomen.  Nausea or vomiting.  A fever.  Increasing pain in your abdomen that is not relieved with medicine. Summary  After the procedure, it is  common to have a small amount of blood in your stool. You may also have mild cramping and bloating of your abdomen.  For the first 24 hours after the procedure, do not drive or use machinery, sign important documents, or drink alcohol.  Get help right away if you have a lot of blood in your stool, nausea or vomiting, a fever, or increased pain in your abdomen. This information is not intended to replace advice given to you by your health care provider. Make sure you discuss any questions you have with your health care provider. Document Revised: 10/03/2018 Document Reviewed: 10/03/2018 Elsevier Patient Education  [place] After These instructions provide you with information about caring for yourself after your procedure. Your health care provider may also give you more specific instructions. Your treatment has been planned according to current medical practices, but problems sometimes occur. Call your health care provider if you have any problems or questions after your procedure. What can I expect after the procedure? After your procedure, you may:  Feel sleepy for several hours.  Feel clumsy and have poor balance for several hours.  Feel forgetful about what happened after the procedure.  Have poor judgment for several hours.  Feel nauseous or vomit.  Have a sore throat if you had a breathing tube during the procedure. Follow these instructions at home: For at least 24 hours after the procedure:      Have a responsible adult stay with you. It is important to have someone help care for you until you are awake and alert.  Rest as needed.  Do not: ? Participate in activities in which you could fall or become injured. ? Drive. ? Use heavy machinery. ? Drink alcohol. ? Take sleeping pills or medicines that cause drowsiness. ? Make important decisions or sign legal documents. ? Take care of children on your own. Eating and  drinking  Follow the diet that is recommended by your health care provider.  If you vomit, drink water, juice, or soup when you can drink without vomiting.  Make sure you have little or no nausea before eating solid foods. General instructions  Take over-the-counter and prescription medicines only as told by your health care provider.  If you have sleep apnea, surgery and certain medicines can increase your risk for breathing problems. Follow instructions from your health care provider about wearing your sleep device: ? Anytime you are sleeping, including during daytime naps. ? While taking prescription pain medicines, sleeping medicines, or medicines that make you drowsy.  If you smoke, do not smoke without supervision.  Keep all follow-up visits as told by your health care provider. This is important. Contact a health care provider if:  You keep feeling  nauseous or you keep vomiting.  You feel light-headed.  You develop a rash.  You have a fever. Get help right away if:  You have trouble breathing. Summary  For several hours after your procedure, you may feel sleepy and have poor judgment.  Have a responsible adult stay with you for at least 24 hours or until you are awake and alert. This information is not intended to replace advice given to you by your health care provider. Make sure you discuss any questions you have with your health care provider. Document Revised: 06/07/2017 Document Reviewed: 06/30/2015 Elsevier Patient Education  [place].

## 2020-01-11 ENCOUNTER — Other Ambulatory Visit: Payer: Self-pay

## 2020-01-11 ENCOUNTER — Encounter (HOSPITAL_COMMUNITY): Payer: Self-pay

## 2020-01-11 ENCOUNTER — Other Ambulatory Visit (HOSPITAL_COMMUNITY)
Admission: RE | Admit: 2020-01-11 | Discharge: 2020-01-11 | Disposition: A | Discharge status: Home / Self Care | Payer: BLUE CROSS/BLUE SHIELD | Source: Ambulatory Visit | Attending: Internal Medicine | Admitting: Internal Medicine

## 2020-01-11 ENCOUNTER — Encounter (HOSPITAL_COMMUNITY)
Admission: RE | Admit: 2020-01-11 | Discharge: 2020-01-11 | Disposition: A | Discharge status: Home / Self Care | Payer: BC Managed Care – PPO | Source: Ambulatory Visit | Attending: Internal Medicine | Admitting: Internal Medicine

## 2020-01-11 DIAGNOSIS — Z20822 Contact with and (suspected) exposure to covid-19: Secondary | ICD-10-CM | POA: Diagnosis not present

## 2020-01-11 DIAGNOSIS — Z01818 Encounter for other preprocedural examination: Secondary | ICD-10-CM | POA: Diagnosis not present

## 2020-01-12 LAB — SARS CORONAVIRUS 2 (TAT 6-24 HRS): SARS Coronavirus 2: NEGATIVE

## 2020-01-15 ENCOUNTER — Encounter (HOSPITAL_COMMUNITY)
Admission: RE | Disposition: A | Discharge status: Home / Self Care | Payer: Self-pay | Source: Home / Self Care | Attending: Internal Medicine

## 2020-01-15 ENCOUNTER — Ambulatory Visit (HOSPITAL_COMMUNITY)
Admission: RE | Admit: 2020-01-15 | Discharge: 2020-01-15 | Disposition: A | Discharge status: Home / Self Care | Payer: BC Managed Care – PPO | Attending: Internal Medicine | Admitting: Internal Medicine

## 2020-01-15 ENCOUNTER — Ambulatory Visit (HOSPITAL_COMMUNITY): Payer: BC Managed Care – PPO | Admitting: Anesthesiology

## 2020-01-15 ENCOUNTER — Encounter (HOSPITAL_COMMUNITY): Payer: Self-pay | Admitting: Internal Medicine

## 2020-01-15 ENCOUNTER — Other Ambulatory Visit: Payer: Self-pay

## 2020-01-15 DIAGNOSIS — K640 First degree hemorrhoids: Secondary | ICD-10-CM | POA: Insufficient documentation

## 2020-01-15 DIAGNOSIS — Z86010 Personal history of colonic polyps: Secondary | ICD-10-CM | POA: Diagnosis not present

## 2020-01-15 DIAGNOSIS — Z1211 Encounter for screening for malignant neoplasm of colon: Secondary | ICD-10-CM | POA: Insufficient documentation

## 2020-01-15 DIAGNOSIS — Z09 Encounter for follow-up examination after completed treatment for conditions other than malignant neoplasm: Secondary | ICD-10-CM | POA: Diagnosis not present

## 2020-01-15 DIAGNOSIS — K5730 Diverticulosis of large intestine without perforation or abscess without bleeding: Secondary | ICD-10-CM | POA: Insufficient documentation

## 2020-01-15 DIAGNOSIS — Z888 Allergy status to other drugs, medicaments and biological substances status: Secondary | ICD-10-CM | POA: Diagnosis not present

## 2020-01-15 HISTORY — PX: COLONOSCOPY WITH PROPOFOL: SHX5780

## 2020-01-15 LAB — GLUCOSE, CAPILLARY: Glucose-Capillary: 84 mg/dL (ref 70–99)

## 2020-01-15 SURGERY — COLONOSCOPY WITH PROPOFOL
Anesthesia: General

## 2020-01-15 MED ORDER — CHLORHEXIDINE GLUCONATE CLOTH 2 % EX PADS: 6.0000 | MEDICATED_PAD | Freq: Once | CUTANEOUS | Status: DC

## 2020-01-15 MED ORDER — PROPOFOL 10 MG/ML IV BOLUS
INTRAVENOUS | Status: AC
  Filled 2020-01-15: qty 60

## 2020-01-15 MED ORDER — PROPOFOL 10 MG/ML IV BOLUS
INTRAVENOUS | Status: DC | PRN
  Administered 2020-01-15: 30 mg via INTRAVENOUS
  Administered 2020-01-15: 50 mg via INTRAVENOUS

## 2020-01-15 MED ORDER — LACTATED RINGERS IV SOLN: INTRAVENOUS | Status: DC | PRN

## 2020-01-15 MED ORDER — STERILE WATER FOR IRRIGATION IR SOLN
Status: DC | PRN
  Administered 2020-01-15: 1.5 mL

## 2020-01-15 MED ORDER — PROPOFOL 500 MG/50ML IV EMUL
INTRAVENOUS | Status: DC | PRN
  Administered 2020-01-15: 150 ug/kg/min via INTRAVENOUS

## 2020-01-15 MED ORDER — LACTATED RINGERS IV SOLN: Freq: Once | INTRAVENOUS | Status: AC

## 2020-01-15 NOTE — Anesthesia Preprocedure Evaluation (Signed)
Anesthesia Evaluation  Patient identified by MRN, date of birth, ID band Patient awake    Reviewed: Allergy & Precautions, NPO status , Patient's Chart, lab work & pertinent test results  History of Anesthesia Complications (+) MALIGNANT HYPERTHERMIA, Family history of anesthesia reaction and history of anesthetic complications  Airway Mallampati: II  TM Distance: >3 FB Neck ROM: Full   Comment: Cervical fusion  Dental no notable dental hx. (+) Dental Advisory Given, Teeth Intact   Pulmonary neg pulmonary ROS,    Pulmonary exam normal breath sounds clear to auscultation       Cardiovascular Exercise Tolerance: Good + CAD, + Past MI and + Cardiac Stents  Normal cardiovascular exam Rhythm:Regular Rate:Normal     Neuro/Psych negative neurological ROS  negative psych ROS   GI/Hepatic negative GI ROS, Neg liver ROS,   Endo/Other  negative endocrine ROS  Renal/GU negative Renal ROS  negative genitourinary   Musculoskeletal   Abdominal   Peds  Hematology negative hematology ROS (+)   Anesthesia Other Findings   Reproductive/Obstetrics negative OB ROS                             Anesthesia Physical Anesthesia Plan  ASA: III  Anesthesia Plan: General   Post-op Pain Management:    Induction: Intravenous  PONV Risk Score and Plan: TIVA  Airway Management Planned: Nasal Cannula and Natural Airway  Additional Equipment:   Intra-op Plan:   Post-operative Plan:   Informed Consent: I have reviewed the patients History and Physical, chart, labs and discussed the procedure including the risks, benefits and alternatives for the proposed anesthesia with the patient or authorized representative who has indicated his/her understanding and acceptance.     Dental advisory given  Plan Discussed with: CRNA and Surgeon  Anesthesia Plan Comments:         Anesthesia Quick Evaluation

## 2020-01-15 NOTE — H&P (Signed)
@LOGO @   Primary Care Physician:  , DO Primary Gastroenterologist:  Dr. Annalee Genta  Pre-Procedure History & Physical: HPI:  [name]. is a 74 y.o. male here for surveillance colonoscopy.  Chronically anticoagulated on Coumadin for PE/DVT.  Bowel symptoms currently.  Past Medical History:  Diagnosis Date  . Anticoagulated on Coumadin   . Avascular necrosis of hip (HCC)    LEFT  . DVT (deep venous thrombosis) (HCC) 2009  . Dyslipidemia, goal LDL below 70 04/09/2017  . Family history of anesthesia complication    BROTHER HAD MALIGNANT HYPOTHERMIA - PT HAS NOT HAD ANY PROBLEMS WITH ANESTHESIA    . History of kidney stones   . Malignant hyperthermia    PT'S BROTHER HAS HX MALIGNANT HYPERTHERMIA  . NSTEMI (non-ST elevated myocardial infarction) (HCC) 04/09/2017  . Pulmonary embolism (HCC) 2009  . Vertigo    NO RECENT PROBLEMS    Past Surgical History:  Procedure Laterality Date  . BACK SURGERY  2009   DISCECTOMY  . CERVICAL FUSION  2008  . COLONOSCOPY  2005   Dr. 2006: normal  . COLONOSCOPY  2010   Dr. 2011, +Polyps but no path available. Surveillance 5 years  . COLONOSCOPY  2016   Dr. 2017  . CORONARY STENT INTERVENTION N/A 04/09/2017   Procedure: CORONARY STENT INTERVENTION;  Surgeon: 04/11/2017, MD;  Location: [facility];  Service: Cardiovascular;  Laterality: N/A;  . CORONARY STENT INTERVENTION N/A 04/12/2017   Procedure: CORONARY STENT INTERVENTION;  Surgeon: 04/14/2017, MD;  Location: [facility];  Service: Cardiovascular;  Laterality: N/A;  . INTRAVASCULAR ULTRASOUND/IVUS N/A 04/12/2017   Procedure: Intravascular Ultrasound/IVUS;  Surgeon: 04/14/2017, MD;  Location: [facility];  Service: Cardiovascular;  Laterality: N/A;  . LEFT HEART CATH AND CORONARY ANGIOGRAPHY N/A 04/09/2017   Procedure: LEFT HEART CATH AND CORONARY ANGIOGRAPHY;  Surgeon: 04/11/2017, MD;  Location: [facility];  Service:  Cardiovascular;  Laterality: N/A;  . TOTAL HIP ARTHROPLASTY Right 12/22/2013   Procedure: RIGHT TOTAL HIP ARTHROPLASTY ANTERIOR APPROACH;  Surgeon: 02/21/2014, MD;  Location: [facility];  Service: Orthopedics;  Laterality: Right;  . TOTAL HIP ARTHROPLASTY Left 04/30/2015   Procedure: LEFT TOTAL HIP ARTHROPLASTY ANTERIOR APPROACH;  Surgeon: 06/28/2015, MD;  Location: [facility];  Service: Orthopedics;  Laterality: Left;    Prior to Admission medications   Medication Sig Start Date End Date Taking? Authorizing Provider  acetaminophen (TYLENOL) 500 MG tablet Take 2 tablets (1,000 mg total) by mouth every 8 (eight) hours. Patient taking differently: Take 1,000 mg by mouth every 8 (eight) hours as needed (pain/headaches.).  05/01/15  Yes Babish, 06/29/15, PA-C  dorzolamide (TRUSOPT) 2 % ophthalmic solution Place 1 drop into the right eye in the morning and at bedtime.   Yes [provider]  ezetimibe (ZETIA) 10 MG tablet Take 1 tablet (10 mg total) by mouth daily. 08/18/19 01/01/21 Yes Branch, 03/03/21, MD  lisinopril (ZESTRIL) 5 MG tablet TAKE 1 TABLET BY MOUTH EVERY DAY Patient taking differently: Take 5 mg by mouth at bedtime.  12/08/19  Yes Branch, 12/10/19, MD  nitroGLYCERIN (NITROSTAT) 0.4 MG SL tablet 1 TAB UNDER TONGUE EVERY 5 MINUTES X3 DOSES AS NEED CHEST PAIN. IF NO RELIEF AFTER 1ST DOSE GO TO ER Patient taking differently: Place 0.4 mg under the tongue every 5 (five) minutes x 3 doses as needed for chest pain.  08/31/18  Yes Branch, 10/31/18, MD  sildenafil (REVATIO) 20 MG tablet TAKE 2 TO 3 TABLETS BY MOUTH AS NEEDED 30 MINUTES BEFORE INTERCOURSE Patient taking differently: Take 40-60 mg by mouth daily as needed (erectile dysfunction.).  12/05/19  Yes Branch, Dorothe Pea, MD  warfarin (COUMADIN) 5 MG tablet TAKE ONE TABLET DAILY Patient taking differently: Take 5 mg by mouth every evening.  10/20/19  Yes Ladona Ridgel, Malena M, DO    Allergies as of 11/10/2019 - Review Complete 10/24/2019   Allergen Reaction Noted  . Lyrica [pregabalin]  04/15/2015    Family History  Problem Relation Age of Onset  . Breast cancer Mother   . Hypertension Mother   . Hyperlipidemia Mother   . Heart attack Mother 74  . Stroke Mother   . Hypertension Paternal Grandmother   . Diabetes Paternal Grandmother   . Cirrhosis Sister   . Cancer Brother   . Malignant hyperthermia Brother   . Colon cancer Neg Hx     Social History   Socioeconomic History  . Marital status: Married    Spouse name: Not on file  . Number of children: Not on file  . Years of education: Not on file  . Highest education level: Not on file  Occupational History  . Occupation: self employed, Fish farm manager  Tobacco Use  . Smoking status: Never Smoker  . Smokeless tobacco: Never Used  Vaping Use  . Vaping Use: Never used  Substance and Sexual Activity  . Alcohol use: No  . Drug use: No  . Sexual activity: Not on file  Other Topics Concern  . Not on file  Social History Narrative  . Not on file   Social Determinants of Health   Financial Resource Strain:   . Difficulty of Paying Living Expenses: Not on file  Food Insecurity:   . Worried About Programme researcher, broadcasting/film/video in the Last Year: Not on file  . Ran Out of Food in the Last Year: Not on file  Transportation Needs:   . Lack of Transportation (Medical): Not on file  . Lack of Transportation (Non-Medical): Not on file  Physical Activity:   . Days of Exercise per Week: Not on file  . Minutes of Exercise per Session: Not on file  Stress:   . Feeling of Stress : Not on file  Social Connections:   . Frequency of Communication with Friends and Family: Not on file  . Frequency of Social Gatherings with Friends and Family: Not on file  . Attends Religious Services: Not on file  . Active Member of Clubs or Organizations: Not on file  . Attends Banker Meetings: Not on file  . Marital Status: Not on file  Intimate Partner Violence:   .  Fear of Current or Ex-Partner: Not on file  . Emotionally Abused: Not on file  . Physically Abused: Not on file  . Sexually Abused: Not on file    Review of Systems: See HPI, otherwise negative ROS  Physical Exam: There were no vitals taken for this visit. General:   Alert,  Well-developed, well-nourished, pleasant and cooperative in NAD Neck:  Supple; no masses or thyromegaly. No significant cervical adenopathy. Lungs:  Clear throughout to auscultation.   No wheezes, crackles, or rhonchi. No acute distress. Heart:  Regular rate and rhythm; no murmurs, clicks, rubs,  or gallops. Abdomen: Non-distended, normal bowel sounds.  Soft and nontender without appreciable mass or hepatosplenomegaly.  Pulses:  Normal pulses noted. Extremities:  Without clubbing or edema.  Impression/Plan: 74 year old  gentleman here for surveillance colonoscopy. Distant history colonic polyps.  He remains anticoagulated.  Recommendations: I have offered the patient a surveillance colonoscopy today per plan.  The risks, benefits, limitations, alternatives and imponderables have been reviewed with the patient. Questions have been answered. All parties are agreeable.   He will remain on Coumadin for the procedure.  We discussed increased risk of bleeding should a large polyp be encountered special approach would be required.   Notice: This dictation was prepared with Dragon dictation along with smaller phrase technology. Any transcriptional errors that result from this process are unintentional and may not be corrected upon review.

## 2020-01-15 NOTE — Transfer of Care (Signed)
Immediate Anesthesia Transfer of Care Note  Patient: [name].  Procedure(s) Performed: COLONOSCOPY WITH PROPOFOL (N/A )  Patient Location: PACU  Anesthesia Type:General  Level of Consciousness: awake  Airway & Oxygen Therapy: Patient Spontanous Breathing  Post-op Assessment: Report given to RN  Post vital signs: Reviewed  Last Vitals:  Vitals Value Taken Time  BP 106/70 01/15/20 1042  Temp    Pulse 95 01/15/20 1044  Resp 18 01/15/20 1044  SpO2 100 % 01/15/20 1044  Vitals shown include unvalidated device data.  Last Pain:  Vitals:   01/15/20 1021  TempSrc:   PainSc: 0-No pain         Complications: No complications documented.

## 2020-01-15 NOTE — Op Note (Signed)
[facility] Patient Name: [name] Procedure Date: 01/15/2020 10:04 AM MRN: [id number] Date of Birth: 1945/05/09 Attending MD: Gennette Pac , MD CSN: [id number] Age: 74 Admit Type: Outpatient Procedure:                Colonoscopy Indications:              High risk colon cancer surveillance: Personal                            history of colonic polyps Providers:                Gennette Pac, MD, Buel Ream. Thomasena Edis RN, RN,                            Edythe Clarity, Technician Referring MD:              Medicines:                Propofol per Anesthesia Complications:            No immediate complications. Estimated Blood Loss:     Estimated blood loss: none. Procedure:                Pre-Anesthesia Assessment:                           - Prior to the procedure, a History and Physical                            was performed, and patient medications and                            allergies were reviewed. The patient's tolerance of                            previous anesthesia was also reviewed. The risks                            and benefits of the procedure and the sedation                            options and risks were discussed with the patient.                            All questions were answered, and informed consent                            was obtained. Prior Anticoagulants: The patient                            last took Coumadin (warfarin) 1 day prior to the                            procedure. ASA Grade Assessment: III - A patient  with severe systemic disease. After reviewing the                            risks and benefits, the patient was deemed in                            satisfactory condition to undergo the procedure.                           After obtaining informed consent, the colonoscope                            was passed under direct vision. Throughout the                            procedure, the  patient's blood pressure, pulse, and                            oxygen saturations were monitored continuously. The                            CF-HQ190L ([id number]) scope was introduced through                            the anus and advanced to the the cecum, identified                            by appendiceal orifice and ileocecal valve. The                            colonoscopy was performed without difficulty. The                            patient tolerated the procedure well. The quality                            of the bowel preparation was adequate. Scope In: 10:23:20 AM Scope Out: 10:34:48 AM Scope Withdrawal Time: 0 hours 7 minutes 59 seconds  Total Procedure Duration: 0 hours 11 minutes 28 seconds  Findings:      The perianal and digital rectal examinations were normal.      A few medium-mouthed diverticula were found in the sigmoid colon and       descending colon.      Non-bleeding internal hemorrhoids were found during retroflexion. The       hemorrhoids were moderate, medium-sized and Grade I (internal       hemorrhoids that do not prolapse).      The exam was otherwise without abnormality on direct and retroflexion       views. Impression:               - Diverticulosis in the sigmoid colon and in the                            descending colon.                           -  Non-bleeding internal hemorrhoids.                           - The examination was otherwise normal on direct                            and retroflexion views.                           - No specimens collected. Moderate Sedation:      Moderate (conscious) sedation was personally administered by an       anesthesia professional. The following parameters were monitored: oxygen       saturation, heart rate, blood pressure, respiratory rate, EKG, adequacy       of pulmonary ventilation, and response to care. Recommendation:           - Patient has a contact number available for                             emergencies. The signs and symptoms of potential                            delayed complications were discussed with the                            patient. Return to normal activities tomorrow.                            Written discharge instructions were provided to the                            patient.                           - Resume previous diet.                           - Continue present medications.                           - No repeat colonoscopy due to age.                           - Return to GI clinic PRN. Procedure Code(s):        --- Professional ---                           [phone], Colonoscopy, flexible; diagnostic, including                            collection of specimen(s) by brushing or washing,                            when performed (separate procedure) Diagnosis Code(s):        --- Professional ---  Z86.010, Personal history of colonic polyps                           K64.0, First degree hemorrhoids                           K57.30, Diverticulosis of large intestine without                            perforation or abscess without bleeding CPT copyright 2019 American Medical Association. All rights reserved. The codes documented in this report are preliminary and upon coder review may  be revised to meet current compliance requirements. Gerrit Friends. Porfiria Heinrich, MD Gennette Pac, MD 01/15/2020 10:43:36 AM This report has been signed electronically. Number of Addenda: 0

## 2020-01-15 NOTE — Discharge Instructions (Signed)
Colonoscopy Discharge Instructions  Read the instructions outlined below and refer to this sheet in the next few weeks. These discharge instructions provide you with general information on caring for yourself after you leave the hospital. Your doctor may also give you specific instructions. While your treatment has been planned according to the most current medical practices available, unavoidable complications occasionally occur. If you have any problems or questions after discharge, call Dr. Jena Gauss at [phone]. ACTIVITY  You may resume your regular activity, but move at a slower pace for the next 24 hours.   Take frequent rest periods for the next 24 hours.   Walking will help get rid of the air and reduce the bloated feeling in your belly (abdomen).   No driving for 24 hours (because of the medicine (anesthesia) used during the test).    Do not sign any important legal documents or operate any machinery for 24 hours (because of the anesthesia used during the test).  NUTRITION  Drink plenty of fluids.   You may resume your normal diet as instructed by your doctor.   Begin with a light meal and progress to your normal diet. Heavy or fried foods are harder to digest and may make you feel sick to your stomach (nauseated).   Avoid alcoholic beverages for 24 hours or as instructed.  MEDICATIONS  You may resume your normal medications unless your doctor tells you otherwise.  WHAT YOU CAN EXPECT TODAY  Some feelings of bloating in the abdomen.   Passage of more gas than usual.   Spotting of blood in your stool or on the toilet paper.  IF YOU HAD POLYPS REMOVED DURING THE COLONOSCOPY:  No aspirin products for 7 days or as instructed.   No alcohol for 7 days or as instructed.   Eat a soft diet for the next 24 hours.  FINDING OUT THE RESULTS OF YOUR TEST Not all test results are available during your visit. If your test results are not back during the visit, make an appointment  with your caregiver to find out the results. Do not assume everything is normal if you have not heard from your caregiver or the medical facility. It is important for you to follow up on all of your test results.  SEEK IMMEDIATE MEDICAL ATTENTION IF:  You have more than a spotting of blood in your stool.   Your belly is swollen (abdominal distention).   You are nauseated or vomiting.   You have a temperature over 101.   You have abdominal pain or discomfort that is severe or gets worse throughout the day.   No polyps found today.  Mild diverticulosis on the left side  Informational diverticulosis provided  I do not recommend a future colonoscopy unless new symptoms develop  At patient request, I called Jaysen Wey at [phone] -no contact     Diverticulosis  Diverticulosis is a condition that develops when small pouches (diverticula) form in the wall of the large intestine (colon). The colon is where water is absorbed and stool (feces) is formed. The pouches form when the inside layer of the colon pushes through weak spots in the outer layers of the colon. You may have a few pouches or many of them. The pouches usually do not cause problems unless they become inflamed or infected. When this happens, the condition is called diverticulitis. What are the causes? The cause of this condition is not known. What increases the risk? The following factors may make you more  likely to develop this condition:  Being older than age 4. Your risk for this condition increases with age. Diverticulosis is rare among people younger than age 18. By age 56, many people have it.  Eating a low-fiber diet.  Having frequent constipation.  Being overweight.  Not getting enough exercise.  Smoking.  Taking over-the-counter pain medicines, like aspirin and ibuprofen.  Having a family history of diverticulosis. What are the signs or symptoms? In most people, there are no symptoms of this  condition. If you do have symptoms, they may include:  Bloating.  Cramps in the abdomen.  Constipation or diarrhea.  Pain in the lower left side of the abdomen. How is this diagnosed? Because diverticulosis usually has no symptoms, it is most often diagnosed during an exam for other colon problems. The condition may be diagnosed by:  Using a flexible scope to examine the colon (colonoscopy).  Taking an X-ray of the colon after dye has been put into the colon (barium enema).  Having a CT scan. How is this treated? You may not need treatment for this condition. Your health care provider may recommend treatment to prevent problems. You may need treatment if you have symptoms or if you previously had diverticulitis. Treatment may include:  Eating a high-fiber diet.  Taking a fiber supplement.  Taking a live bacteria supplement (probiotic).  Taking medicine to relax your colon. Follow these instructions at home: Medicines  Take over-the-counter and prescription medicines only as told by your health care provider.  If told by your health care provider, take a fiber supplement or probiotic. Constipation prevention Your condition may cause constipation. To prevent or treat constipation, you may need to:  Drink enough fluid to keep your urine pale yellow.  Take over-the-counter or prescription medicines.  Eat foods that are high in fiber, such as beans, whole grains, and fresh fruits and vegetables.  Limit foods that are high in fat and processed sugars, such as fried or sweet foods.  General instructions  Try not to strain when you have a bowel movement.  Keep all follow-up visits as told by your health care provider. This is important. Contact a health care provider if you:  Have pain in your abdomen.  Have bloating.  Have cramps.  Have not had a bowel movement in 3 days. Get help right away if:  Your pain gets worse.  Your bloating becomes very bad.  You have  a fever or chills, and your symptoms suddenly get worse.  You vomit.  You have bowel movements that are bloody or black.  You have bleeding from your rectum. Summary  Diverticulosis is a condition that develops when small pouches (diverticula) form in the wall of the large intestine (colon).  You may have a few pouches or many of them.  This condition is most often diagnosed during an exam for other colon problems.  Treatment may include increasing the fiber in your diet, taking supplements, or taking medicines. This information is not intended to replace advice given to you by your health care provider. Make sure you discuss any questions you have with your health care provider. Document Revised: 10/06/2018 Document Reviewed: 10/06/2018 Elsevier Patient Education  2020 [employer] Inc[employer]     Monitored Anesthesia Care, Care After These instructions provide you with information about caring for yourself after your procedure. Your health care provider may also give you more specific instructions. Your treatment has been planned according to current medical practices, but problems sometimes occur. Call your  health care provider if you have any problems or questions after your procedure. What can I expect after the procedure? After your procedure, you may:  Feel sleepy for several hours.  Feel clumsy and have poor balance for several hours.  Feel forgetful about what happened after the procedure.  Have poor judgment for several hours.  Feel nauseous or vomit.  Have a sore throat if you had a breathing tube during the procedure. Follow these instructions at home: For at least 24 hours after the procedure:      Have a responsible adult stay with you. It is important to have someone help care for you until you are awake and alert.  Rest as needed.  Do not: ? Participate in activities in which you could fall or become injured. ? Drive. ? Use heavy machinery. ? Drink  alcohol. ? Take sleeping pills or medicines that cause drowsiness. ? Make important decisions or sign legal documents. ? Take care of children on your own. Eating and drinking  Follow the diet that is recommended by your health care provider.  If you vomit, drink water, juice, or soup when you can drink without vomiting.  Make sure you have little or no nausea before eating solid foods. General instructions  Take over-the-counter and prescription medicines only as told by your health care provider.  If you have sleep apnea, surgery and certain medicines can increase your risk for breathing problems. Follow instructions from your health care provider about wearing your sleep device: ? Anytime you are sleeping, including during daytime naps. ? While taking prescription pain medicines, sleeping medicines, or medicines that make you drowsy.  If you smoke, do not smoke without supervision.  Keep all follow-up visits as told by your health care provider. This is important. Contact a health care provider if:  You keep feeling nauseous or you keep vomiting.  You feel light-headed.  You develop a rash.  You have a fever. Get help right away if:  You have trouble breathing. Summary  For several hours after your procedure, you may feel sleepy and have poor judgment.  Have a responsible adult stay with you for at least 24 hours or until you are awake and alert. This information is not intended to replace advice given to you by your health care provider. Make sure you discuss any questions you have with your health care provider. Document Revised: 06/07/2017 Document Reviewed: 06/30/2015 Elsevier Patient Education  2020 [employer].

## 2020-01-15 NOTE — Anesthesia Postprocedure Evaluation (Signed)
Anesthesia Post Note  Patient: [name].  Procedure(s) Performed: COLONOSCOPY WITH PROPOFOL (N/A )  Patient location during evaluation: PACU Anesthesia Type: General Level of consciousness: awake and alert Pain management: pain level controlled Vital Signs Assessment: post-procedure vital signs reviewed and stable Respiratory status: spontaneous breathing Cardiovascular status: blood pressure returned to baseline and stable Postop Assessment: no apparent nausea or vomiting Anesthetic complications: no   No complications documented.   Last Vitals:  Vitals:   01/15/20 1008  BP: 133/78  Pulse: 89  Resp: 16  Temp: 36.7 C  SpO2: 96%    Last Pain:  Vitals:   01/15/20 1021  TempSrc:   PainSc: 0-No pain                 Jashae Wiggs

## 2020-01-18 ENCOUNTER — Encounter (HOSPITAL_COMMUNITY): Payer: Self-pay | Admitting: Internal Medicine

## 2020-01-26 ENCOUNTER — Other Ambulatory Visit: Payer: BC Managed Care – PPO

## 2020-01-29 ENCOUNTER — Other Ambulatory Visit: Payer: Self-pay

## 2020-01-29 ENCOUNTER — Other Ambulatory Visit (INDEPENDENT_AMBULATORY_CARE_PROVIDER_SITE_OTHER): Payer: BC Managed Care – PPO | Admitting: *Deleted

## 2020-01-29 DIAGNOSIS — Z7901 Long term (current) use of anticoagulants: Secondary | ICD-10-CM | POA: Diagnosis not present

## 2020-01-29 LAB — POCT INR: INR: 3.3 — AB (ref 2.0–3.0)

## 2020-02-19 ENCOUNTER — Ambulatory Visit (INDEPENDENT_AMBULATORY_CARE_PROVIDER_SITE_OTHER): Payer: BC Managed Care – PPO | Admitting: Cardiology

## 2020-02-19 ENCOUNTER — Other Ambulatory Visit: Payer: Self-pay

## 2020-02-19 ENCOUNTER — Encounter: Payer: Self-pay | Admitting: Cardiology

## 2020-02-19 VITALS — BP 122/74 | HR 70 | Ht 68.0 in | Wt 173.0 lb

## 2020-02-19 DIAGNOSIS — Z86711 Personal history of pulmonary embolism: Secondary | ICD-10-CM | POA: Diagnosis not present

## 2020-02-19 DIAGNOSIS — I2510 Atherosclerotic heart disease of native coronary artery without angina pectoris: Secondary | ICD-10-CM | POA: Diagnosis not present

## 2020-02-19 DIAGNOSIS — E782 Mixed hyperlipidemia: Secondary | ICD-10-CM | POA: Diagnosis not present

## 2020-02-19 MED ORDER — NITROGLYCERIN 0.4 MG SL SUBL: SUBLINGUAL_TABLET | SUBLINGUAL | 3 refills | Status: AC

## 2020-02-19 NOTE — Progress Notes (Signed)
Clinical Summary Mr. Hales is a 74 y.o.male seen today for follow up of the following medical problems.   1. CAD - admit Jan 2019 with subcascpular pain, found to have NSTEMI peak trop 31 - cath Jan 2019 staged PCI LCX and LAD.  - plans were for triple therapy withasa, plavix, coumdin (for history of PE) x 1 month, then plavix and coumadin - Jan 2019 echo LVEF 60-65%, no wma's, grade I diatolic dysfunction.  - previously came off beta blocker and statin due to diarrhea. He tried taking pravastatin low dose and did so for a few months but had recurrent diarrhea and ended up stopping    No chest pain. No SOB or DOe - compliant with meds  2. History of bilateral PE - occurred after immobility after back surgeryin 10/2017 - 09/2013 LE US showed chronic DVT left femoral vein - 06/2017 CT PE no PE - 06/2017 LE venous US: left popliteal vein DVT new compared to 09/2013 study. Chronic DVT left femoral and tibial veins. Chronic DVT left deep femoral vein.  - on life long coumadin from notes, notes indicate this was recommended by hematology in the past -seen again by hematology, decision made to continue anticoagulation.  - no bleeding on coumadin.    3. Hyperlipidemia - 11/2018 TC 187 TG 110 HDL 42 LDL 125 - intolerant to statins  - last visit 07/2019 was to start zetia 10mg  daily.  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 Anticoagulated on Coumadin   . Avascular necrosis of hip (HCC)    LEFT  . DVT (deep venous thrombosis) (HCC) 2009  . Dyslipidemia, goal LDL below 70 04/09/2017  . Family history of anesthesia complication    BROTHER HAD MALIGNANT HYPOTHERMIA - PT HAS NOT HAD ANY PROBLEMS WITH ANESTHESIA    . History of kidney stones   . Malignant hyperthermia    PT'S BROTHER HAS HX MALIGNANT HYPERTHERMIA  . NSTEMI (non-ST  elevated myocardial infarction) (HCC) 04/09/2017  . Pulmonary embolism (HCC) 2009  . Vertigo    NO RECENT PROBLEMS     Allergies  Allergen Reactions  . Lyrica [Pregabalin]     "went out of head"     Current Outpatient Medications  Medication Sig Dispense Refill  . acetaminophen (TYLENOL) 500 MG tablet Take 2 tablets (1,000 mg total) by mouth every 8 (eight) hours. (Patient taking differently: Take 1,000 mg by mouth every 8 (eight) hours as needed (pain/headaches.). ) 30 tablet 0  . dorzolamide (TRUSOPT) 2 % ophthalmic solution Place 1 drop into the right eye in the morning and at bedtime.    Marland Kitchen ezetimibe (ZETIA) 10 MG tablet Take 1 tablet (10 mg total) by mouth daily. 90 tablet 1  . lisinopril (ZESTRIL) 5 MG tablet TAKE 1 TABLET BY MOUTH EVERY DAY (Patient taking differently: Take 5 mg by mouth at bedtime. ) 90 tablet 3  . nitroGLYCERIN (NITROSTAT) 0.4 MG SL tablet 1 TAB UNDER TONGUE EVERY 5 MINUTES X3 DOSES AS NEED CHEST PAIN. IF NO RELIEF AFTER 1ST DOSE GO TO ER (Patient taking differently: Place 0.4 mg under the tongue every 5 (five) minutes x 3 doses as needed for chest pain. ) 25 tablet 3  . sildenafil (REVATIO) 20 MG tablet TAKE 2 TO 3 TABLETS BY MOUTH AS NEEDED 30  MINUTES BEFORE INTERCOURSE (Patient taking differently: Take 40-60 mg by mouth daily as needed (erectile dysfunction.). ) 90 tablet 1  . warfarin (COUMADIN) 5 MG tablet TAKE ONE TABLET DAILY (Patient taking differently: Take 5 mg by mouth every evening. ) 10 tablet 0   No current facility-administered medications for this visit.     Past Surgical History:  Procedure Laterality Date  . BACK SURGERY  2009   DISCECTOMY  . CERVICAL FUSION  2008  . COLONOSCOPY  2005   Dr. Madilyn Fireman: normal  . COLONOSCOPY  2010   Dr. Jena Gauss, +Polyps but no path available. Surveillance 5 years  . COLONOSCOPY  2016   Dr. Madilyn Fireman  . COLONOSCOPY WITH PROPOFOL N/A 01/15/2020   Procedure: COLONOSCOPY WITH PROPOFOL;  Surgeon: Corbin Ade, MD;   Location: [facility];  Service: Endoscopy;  Laterality: N/A;  10:15am  . CORONARY STENT INTERVENTION N/A 04/09/2017   Procedure: CORONARY STENT INTERVENTION;  Surgeon: Yvonne Kendall, MD;  Location: [facility];  Service: Cardiovascular;  Laterality: N/A;  . CORONARY STENT INTERVENTION N/A 04/12/2017   Procedure: CORONARY STENT INTERVENTION;  Surgeon: Yvonne Kendall, MD;  Location: [facility];  Service: Cardiovascular;  Laterality: N/A;  . INTRAVASCULAR ULTRASOUND/IVUS N/A 04/12/2017   Procedure: Intravascular Ultrasound/IVUS;  Surgeon: Yvonne Kendall, MD;  Location: [facility];  Service: Cardiovascular;  Laterality: N/A;  . LEFT HEART CATH AND CORONARY ANGIOGRAPHY N/A 04/09/2017   Procedure: LEFT HEART CATH AND CORONARY ANGIOGRAPHY;  Surgeon: Yvonne Kendall, MD;  Location: [facility];  Service: Cardiovascular;  Laterality: N/A;  . TOTAL HIP ARTHROPLASTY Right 12/22/2013   Procedure: RIGHT TOTAL HIP ARTHROPLASTY ANTERIOR APPROACH;  Surgeon: Shelda Pal, MD;  Location: [facility];  Service: Orthopedics;  Laterality: Right;  . TOTAL HIP ARTHROPLASTY Left 04/30/2015   Procedure: LEFT TOTAL HIP ARTHROPLASTY ANTERIOR APPROACH;  Surgeon: Durene Romans, MD;  Location: [facility];  Service: Orthopedics;  Laterality: Left;     Allergies  Allergen Reactions  . Lyrica [Pregabalin]     "went out of head"      Family History  Problem Relation Age of Onset  . Breast cancer Mother   . Hypertension Mother   . Hyperlipidemia Mother   . Heart attack Mother 72  . Stroke Mother   . Hypertension Paternal Grandmother   . Diabetes Paternal Grandmother   . Cirrhosis Sister   . Cancer Brother   . Malignant hyperthermia Brother   . Colon cancer Neg Hx      Social History Mr. Gottwald reports that he has never smoked. He has never used smokeless tobacco. Mr. Hazel reports no history of alcohol use.   Review of Systems CONSTITUTIONAL: No weight loss, fever, chills,  weakness or fatigue.  HEENT: Eyes: No visual loss, blurred vision, double vision or yellow sclerae.No hearing loss, sneezing, congestion, runny nose or sore throat.  SKIN: No rash or itching.  CARDIOVASCULAR: per hpi RESPIRATORY: No shortness of breath, cough or sputum.  GASTROINTESTINAL: No anorexia, nausea, vomiting or diarrhea. No abdominal pain or blood.  GENITOURINARY: No burning on urination, no polyuria NEUROLOGICAL: No headache, dizziness, syncope, paralysis, ataxia, numbness or tingling in the extremities. No change in bowel or bladder control.  MUSCULOSKELETAL: No muscle, back pain, joint pain or stiffness.  LYMPHATICS: No enlarged nodes. No history of splenectomy.  PSYCHIATRIC: No history of depression or anxiety.  ENDOCRINOLOGIC: No reports of sweating, cold or heat intolerance. No polyuria or polydipsia.  Marland Kitchen  Physical Examination Today's Vitals   02/19/20 0959  BP: 122/74  Pulse: 70  Weight: 173 lb (78.5 kg)  Height: 5\' 8"  (1.727 m)   Body mass index is 26.3 kg/m.  Gen: resting comfortably, no acute distress HEENT: no scleral icterus, pupils equal round and reactive, no palptable cervical adenopathy,  CV: RRR, no m/r/g, no jvd Resp: Clear to auscultation bilaterally GI: abdomen is soft, non-tender, non-distended, normal bowel sounds, no hepatosplenomegaly MSK: extremities are warm, no edema.  Skin: warm, no rash Neuro:  no focal deficits Psych: appropriate affect   Diagnostic Studies Jan 2019 cath Conclusions: 1. Significant two-vessel coronary artery disease with 80-90% mid LAD stenosis and occluded mid LCx. 2. Mild to moderate right coronary artery disease. Proximal and mid portions of the LAD and RCA are ectatic/aneurysmal. 3. Mildly to moderately reduced left ventricular contraction with mid anterolateral hypokinesis. Low normal LVEDP. 4. Successful PCI to mid LCx using Resolute Onyx 2.0 x 2.0 x 22 mm DES (post-dilated to 2.6 mm proximally) with 0% residual  stenosis and TIMI-3 flow.  Recommendations: 1. Plan for staged PCI to LAD next week, as renal function allows. 2. ASA and ticagrelor given today; recommend switching ticagrelor to clopidogrel prior to discharge. Continue warfarin, clopidogrel, and aspirin x 1 month, then discontinue aspirin and complete at least 12 months of warfarin and clopidogrel. 3. Aggressive secondary prevention.   Jan 2019 cath staged PCI Conclusions: 1. Aneurysmal LAD with sequential moderate to severe stenosis involving the proximal and mid portions of the vessel. 2. Patent stent in the mid LCx. 3. Successful IVUS-guided PCI to the mid LAD with placement of non-overlapping Resolute Onyx 3.5 x 8 mm (proximal) and Resolute Onyx 2.75 x 38 mm (distal) drug-eluting stents with 0% residual stenosis and TIMI-3 flow.  Recommendations: 1. Transition from ticagrelor to clopidogrel; will load with clopidogrel 300 mg x 1 tomorrow morning, followed by 75 mg daily thereafter. 2. Resume warfarin per pharmacy tonight. Patient and his family report that he has been bridged in the past for procedures requiring cessation of warfarin. If there is no evidence of bleeding or vascular injury tomorrow, Lovenox bridge should be started tomorrow morning per pharmacy. 3. Continue aspirin, clopidogrel, and warfarin x 1 month. If INR therapeutic/stable, aspirin can be discontinued at that time. 4. Aggressive secondary prevention.  Jan 2019 echo Study Conclusions  - Left ventricle: The cavity size was normal. Wall thickness was normal. Systolic function was normal. The estimated ejection fraction was in the range of 60% to 65%. Wall motion was normal; there were no regional wall motion abnormalities. Doppler parameters are consistent with abnormal left ventricular relaxation (grade 1 diastolic dysfunction). The E/e&' ratio is between 8-15, suggesting indeterminate LV filling pressure. - Aortic valve: Trileaflet. Sclerosis  without stenosis. There was trivial regurgitation. - Mitral valve: Mildly thickened leaflets . There was trivial regurgitation. - Left atrium: The atrium was mildly dilated. - Tricuspid valve: There was trivial regurgitation. - Pulmonary arteries: PA peak pressure: 18 mm Hg (S). - Inferior vena cava: The vessel was normal in size. The respirophasic diameter changes were in the normal range (>= 50%), consistent with normal central venous pressure.  Impressions:  - LVEF 60-65%, normal wall thickness, normal wall motion, grade 1 DD, indeterminate LV filling pressure, aortic valve sclerosis with trivial AI, trivial MR, mild LAE, trivial TR, RVSP 18 mmHg, normal IVC.    Assessment and Plan  1. CAD - diarrhea on beta blockers and statins - tolerating current regimen, no changes  2. History of DVT/PE - he will continue coumadin indefintiely  3. Hyperlipidemia - did not tolerate statins. On zetia LDL is down to 88 which overall is reasonable for him, continue at this time. If elevation of LDL over time would need to consider pcsk9 inhibitor   F/u 6 months   Antoine Poche, M.D.

## 2020-02-19 NOTE — Patient Instructions (Signed)
Your physician wants you to follow-up in: 6 MONTHS WITH DR BRANCH   Your physician recommends that you continue on your current medications as directed. Please refer to the Current Medication list given to you today.  Thank you for choosing [facility]!!   

## 2020-02-20 ENCOUNTER — Other Ambulatory Visit: Payer: BC Managed Care – PPO

## 2020-02-26 ENCOUNTER — Other Ambulatory Visit: Payer: Self-pay | Admitting: Cardiology

## 2020-02-26 NOTE — Telephone Encounter (Signed)
#  90 day supply of Zetia sent to [facility] today by [employer] staff

## 2020-02-26 NOTE — Telephone Encounter (Signed)
New message      *STAT* If patient is at the pharmacy, call can be transferred to refill team.   1. Which medications need to be refilled? (please list name of each medication and dose if known) ezetimibe (ZETIA) 10 MG tablet  2. Which pharmacy/location (including street and city if local pharmacy) is [facility] to [facility] to[facility] [place]   3. Do they need a 30 day or 90 day supply? 90

## 2020-02-27 ENCOUNTER — Other Ambulatory Visit: Payer: Self-pay

## 2020-02-27 ENCOUNTER — Other Ambulatory Visit (INDEPENDENT_AMBULATORY_CARE_PROVIDER_SITE_OTHER): Payer: BC Managed Care – PPO | Admitting: *Deleted

## 2020-02-27 DIAGNOSIS — Z7901 Long term (current) use of anticoagulants: Secondary | ICD-10-CM | POA: Diagnosis not present

## 2020-02-27 LAB — POCT INR: INR: 2 (ref 2.0–3.0)

## 2020-02-27 NOTE — Patient Instructions (Signed)
Take coumadin 5mg  tablets one tablet every day and recheck INR in 4 weeks

## 2020-03-26 ENCOUNTER — Other Ambulatory Visit (INDEPENDENT_AMBULATORY_CARE_PROVIDER_SITE_OTHER): Payer: BC Managed Care – PPO | Admitting: *Deleted

## 2020-03-26 DIAGNOSIS — Z7901 Long term (current) use of anticoagulants: Secondary | ICD-10-CM | POA: Diagnosis not present

## 2020-03-26 LAB — POCT INR: INR: 3.2 — AB (ref 2.0–3.0)

## 2020-03-26 NOTE — Progress Notes (Unsigned)
Skip coumadin dose today and then take one tablet (5mg ) every day and recheck INR in 4 weeks.

## 2020-04-24 ENCOUNTER — Other Ambulatory Visit (INDEPENDENT_AMBULATORY_CARE_PROVIDER_SITE_OTHER): Payer: BC Managed Care – PPO | Admitting: *Deleted

## 2020-04-24 ENCOUNTER — Other Ambulatory Visit: Payer: Self-pay

## 2020-04-24 DIAGNOSIS — Z7901 Long term (current) use of anticoagulants: Secondary | ICD-10-CM

## 2020-04-24 LAB — POCT INR: INR: 3 (ref 2.0–3.0)

## 2020-04-24 NOTE — Patient Instructions (Signed)
Skip today's dose of coumadin and then take one whole 5mg  tablet every day and check INR in 4 weeks.

## 2020-04-25 ENCOUNTER — Telehealth: Payer: Self-pay | Admitting: Family Medicine

## 2020-04-25 MED ORDER — WARFARIN SODIUM 5 MG PO TABS: 5.0000 mg | ORAL_TABLET | Freq: Every day | ORAL | 1 refills | Status: DC

## 2020-04-25 NOTE — Telephone Encounter (Signed)
Prescription sent electronically to pharmacy. Patient notified. 

## 2020-04-25 NOTE — Telephone Encounter (Signed)
Patient is requesting refill on jantozen 5 mg called into [facility]  Mail order . Please advise

## 2020-04-25 NOTE — Telephone Encounter (Signed)
[name] is generic Coumadin Apparently he is requesting 90-day supply Please make sure that he is up-to-date on his Coumadin checks.  He may have 90-day supply with 1 refill  Please continue monthly INRs and regular office visits thank you

## 2020-05-23 ENCOUNTER — Other Ambulatory Visit (INDEPENDENT_AMBULATORY_CARE_PROVIDER_SITE_OTHER): Payer: BC Managed Care – PPO | Admitting: *Deleted

## 2020-05-23 ENCOUNTER — Other Ambulatory Visit: Payer: BC Managed Care – PPO

## 2020-05-23 ENCOUNTER — Other Ambulatory Visit: Payer: Self-pay

## 2020-05-23 DIAGNOSIS — Z7901 Long term (current) use of anticoagulants: Secondary | ICD-10-CM | POA: Diagnosis not present

## 2020-05-23 LAB — POCT INR: INR: 2.9 (ref 2.0–3.0)

## 2020-06-20 ENCOUNTER — Other Ambulatory Visit (INDEPENDENT_AMBULATORY_CARE_PROVIDER_SITE_OTHER): Payer: BC Managed Care – PPO | Admitting: *Deleted

## 2020-06-20 ENCOUNTER — Other Ambulatory Visit: Payer: Self-pay

## 2020-06-20 DIAGNOSIS — Z7901 Long term (current) use of anticoagulants: Secondary | ICD-10-CM | POA: Diagnosis not present

## 2020-06-20 LAB — POCT INR: INR: 3.2 — AB (ref 2.0–3.0)

## 2020-06-25 DIAGNOSIS — H401114 Primary open-angle glaucoma, right eye, indeterminate stage: Secondary | ICD-10-CM | POA: Diagnosis not present

## 2020-06-26 ENCOUNTER — Encounter: Payer: Self-pay | Admitting: Family Medicine

## 2020-06-26 ENCOUNTER — Ambulatory Visit (HOSPITAL_COMMUNITY)
Admission: RE | Admit: 2020-06-26 | Discharge: 2020-06-26 | Disposition: A | Discharge status: Home / Self Care | Payer: BC Managed Care – PPO | Source: Ambulatory Visit | Attending: Family Medicine | Admitting: Family Medicine

## 2020-06-26 ENCOUNTER — Other Ambulatory Visit: Payer: Self-pay

## 2020-06-26 ENCOUNTER — Ambulatory Visit (INDEPENDENT_AMBULATORY_CARE_PROVIDER_SITE_OTHER): Payer: BC Managed Care – PPO | Admitting: Family Medicine

## 2020-06-26 VITALS — HR 97 | Temp 98.5°F | Resp 16 | Wt 171.4 lb

## 2020-06-26 DIAGNOSIS — R059 Cough, unspecified: Secondary | ICD-10-CM | POA: Diagnosis not present

## 2020-06-26 DIAGNOSIS — R058 Other specified cough: Secondary | ICD-10-CM | POA: Insufficient documentation

## 2020-06-26 MED ORDER — GUAIFENESIN-CODEINE 100-10 MG/5ML PO SOLN: 5.0000 mL | Freq: Three times a day (TID) | ORAL | 0 refills | Status: DC | PRN

## 2020-06-26 NOTE — Progress Notes (Signed)
Patient ID: [name]., male    DOB: [birth year], 75 y.o.   MRN: [id number]   Chief Complaint  Patient presents with  . Cough   Subjective:  CC: productive cough and fatigue x 7 days  This is a new problem.  Presents today for an acute visit with a complaint of cough and sinus drainage.  Reports that his symptoms have been present for 7 days, the cough is so bad, he is having to sleep upright in the recliner.  His wife has recently been treated for pneumonia, he denies fever and chills, endorses fatigue congestion and cough.   Patient presents today with respiratory illness Number of days present- one week   Symptoms include- cough, sinus drainage,   Presence of worrisome signs (severe shortness of breath, lethargy, etc.) - none  Recent/current visit to urgent care or ER- none  Recent direct exposure to Covid- none  Any current Covid testing- none  Medical History [name] has a past medical history of Anticoagulated on Coumadin, Avascular necrosis of hip (HCC), DVT (deep venous thrombosis) (HCC) (2009), Dyslipidemia, goal LDL below 70 (04/09/2017), Family history of anesthesia complication, History of kidney stones, Malignant hyperthermia, NSTEMI (non-ST elevated myocardial infarction) (HCC) (04/09/2017), Pulmonary embolism (HCC) (2009), and Vertigo.   Outpatient Encounter Medications as of 06/26/2020  Medication Sig  . acetaminophen (TYLENOL) 500 MG tablet Take 2 tablets (1,000 mg total) by mouth every 8 (eight) hours. (Patient taking differently: Take 1,000 mg by mouth every 8 (eight) hours as needed (pain/headaches.).)  . dorzolamide (TRUSOPT) 2 % ophthalmic solution Place 1 drop into the right eye in the morning and at bedtime.  Marland Kitchen ezetimibe (ZETIA) 10 MG tablet TAKE 1 TABLET BY MOUTH EVERY DAY  . guaiFENesin-codeine 100-10 MG/5ML syrup Take 5 mLs by mouth 3 (three) times daily as needed for cough.  Marland Kitchen lisinopril (ZESTRIL) 5 MG tablet TAKE 1 TABLET BY MOUTH EVERY DAY (Patient  taking differently: Take 5 mg by mouth at bedtime.)  . nitroGLYCERIN (NITROSTAT) 0.4 MG SL tablet 1 TAB UNDER TONGUE EVERY 5 MINUTES X3 DOSES AS NEED CHEST PAIN. IF NO RELIEF AFTER 1ST DOSE GO TO ER  . sildenafil (REVATIO) 20 MG tablet TAKE 2 TO 3 TABLETS BY MOUTH AS NEEDED 30 MINUTES BEFORE INTERCOURSE (Patient taking differently: Take 40-60 mg by mouth daily as needed (erectile dysfunction.).)  . warfarin (COUMADIN) 5 MG tablet Take 1 tablet (5 mg total) by mouth daily.   No facility-administered encounter medications on file as of 06/26/2020.     Review of Systems  Constitutional: Positive for fatigue. Negative for chills and fever.       Tired and weak from cough.   HENT: Positive for congestion. Negative for sinus pressure, sinus pain and sore throat.   Respiratory: Positive for cough. Negative for shortness of breath.   Cardiovascular: Negative for chest pain.  Neurological: Negative for headaches.     Vitals Pulse 97   Temp 98.5 F (36.9 C)   Resp 16   Wt 171 lb 6.4 oz (77.7 kg)   SpO2 96%   BMI 26.06 kg/m   Objective:   Physical Exam Vitals reviewed.  Cardiovascular:     Rate and Rhythm: Normal rate and regular rhythm.     Heart sounds: Normal heart sounds.  Pulmonary:     Effort: Pulmonary effort is normal.     Breath sounds: Normal breath sounds.  Skin:    General: Skin is warm and dry.  Neurological:  General: No focal deficit present.     Mental Status: He is alert.  Psychiatric:        Behavior: Behavior normal.      Assessment and Plan   1. Productive cough - DG Chest 2 View - guaiFENesin-codeine 100-10 MG/5ML syrup; Take 5 mLs by mouth 3 (three) times daily as needed for cough.  Dispense: 120 mL; Refill: 0   Productive cough, wife with pneumonia, will send for stat chest x-ray to rule out pneumonia.  Lung sounds okay, saturations 96%.  Endorses fatigue no fever no chills.  Update:  No evidence of pneumonia. Nurse to notify. Will send cough  medicine.   Agrees with plan of care discussed today. Understands warning signs to seek further care: chest pain, shortness of breath, any significant change in health.  Understands to follow-up if symptoms do not improve or worsen.   Dorena Bodo, NP 06/26/2020

## 2020-06-26 NOTE — Patient Instructions (Signed)

## 2020-07-17 ENCOUNTER — Other Ambulatory Visit: Payer: Self-pay

## 2020-07-17 ENCOUNTER — Other Ambulatory Visit (INDEPENDENT_AMBULATORY_CARE_PROVIDER_SITE_OTHER): Payer: BC Managed Care – PPO | Admitting: *Deleted

## 2020-07-17 DIAGNOSIS — Z7901 Long term (current) use of anticoagulants: Secondary | ICD-10-CM

## 2020-07-17 LAB — POCT INR: INR: 3.2 — AB (ref 2.0–3.0)

## 2020-08-15 ENCOUNTER — Encounter: Payer: Self-pay | Admitting: Cardiology

## 2020-08-15 ENCOUNTER — Ambulatory Visit: Payer: BC Managed Care – PPO | Admitting: Cardiology

## 2020-08-15 VITALS — BP 132/78 | HR 59 | Ht 68.0 in | Wt 173.4 lb

## 2020-08-15 DIAGNOSIS — E782 Mixed hyperlipidemia: Secondary | ICD-10-CM

## 2020-08-15 DIAGNOSIS — I2510 Atherosclerotic heart disease of native coronary artery without angina pectoris: Secondary | ICD-10-CM

## 2020-08-15 DIAGNOSIS — Z86711 Personal history of pulmonary embolism: Secondary | ICD-10-CM

## 2020-08-15 NOTE — Progress Notes (Signed)
Clinical Summary Mr. Hurtado is a 75 y.o.male seen today for follow up of the following medical problems.   1. CAD - admit Jan 2019 with subcascpular pain, found to have NSTEMI peak trop 31 - cath Jan 2019 staged PCI LCX and LAD.  - plans were for triple therapy withasa, plavix, coumdin (for history of PE) x 1 month, then plavix and coumadin - Jan 2019 echo LVEF 60-65%, no wma's, grade I diatolic dysfunction.  - previously came off beta blocker and statin due to diarrhea. He tried taking pravastatin low dose and did so for a few months but had recurrent diarrhea and ended up stopping   - no recent chest pain, no SOB/DOe - compliant with meds    2. History of bilateral PE - occurred after immobility after back surgeryin 10/2017 - 09/2013 LE US showed chronic DVT left femoral vein - 06/2017 CT PE no PE - 06/2017 LE venous US: left popliteal vein DVT new compared to 09/2013 study. Chronic DVT left femoral and tibial veins. Chronic DVT left deep femoral vein.  - on life long coumadin from notes, notes indicate this was recommended by hematology in the past -seen again by hematology, decision made to continue anticoagulation.   - no bleeding on coumadin  3. Hyperlipidemia - 11/2018 TC 187 TG 110 HDL 42 LDL 125 - intolerant to statins  -  started zetia 10mg  daily.  - 12/2019 TC 144 TG 134 HDL 32 LDL 88    Preschool grandson age 49 Past Medical History:  Diagnosis Date  . Anticoagulated on Coumadin   . Avascular necrosis of hip (HCC)    LEFT  . DVT (deep venous thrombosis) (HCC) 2009  . Dyslipidemia, goal LDL below 70 04/09/2017  . Family history of anesthesia complication    BROTHER HAD MALIGNANT HYPOTHERMIA - PT HAS NOT HAD ANY PROBLEMS WITH ANESTHESIA    . History of kidney stones   . Malignant hyperthermia    PT'S BROTHER HAS HX MALIGNANT HYPERTHERMIA  . NSTEMI (non-ST elevated myocardial infarction) (HCC) 04/09/2017  . Pulmonary embolism (HCC) 2009   . Vertigo    NO RECENT PROBLEMS     Allergies  Allergen Reactions  . Lyrica [Pregabalin]     "went out of head"     Current Outpatient Medications  Medication Sig Dispense Refill  . acetaminophen (TYLENOL) 500 MG tablet Take 2 tablets (1,000 mg total) by mouth every 8 (eight) hours. (Patient taking differently: Take 1,000 mg by mouth every 8 (eight) hours as needed (pain/headaches.).) 30 tablet 0  . dorzolamide (TRUSOPT) 2 % ophthalmic solution Place 1 drop into the right eye in the morning and at bedtime.    2010 ezetimibe (ZETIA) 10 MG tablet TAKE 1 TABLET BY MOUTH EVERY DAY 90 tablet 3  . guaiFENesin-codeine 100-10 MG/5ML syrup Take 5 mLs by mouth 3 (three) times daily as needed for cough. 120 mL 0  . lisinopril (ZESTRIL) 5 MG tablet TAKE 1 TABLET BY MOUTH EVERY DAY (Patient taking differently: Take 5 mg by mouth at bedtime.) 90 tablet 3  . nitroGLYCERIN (NITROSTAT) 0.4 MG SL tablet 1 TAB UNDER TONGUE EVERY 5 MINUTES X3 DOSES AS NEED CHEST PAIN. IF NO RELIEF AFTER 1ST DOSE GO TO ER 25 tablet 3  . sildenafil (REVATIO) 20 MG tablet TAKE 2 TO 3 TABLETS BY MOUTH AS NEEDED 30 MINUTES BEFORE INTERCOURSE (Patient taking differently: Take 40-60 mg by mouth daily as needed (erectile dysfunction.).) 90 tablet 1  .  warfarin (COUMADIN) 5 MG tablet Take 1 tablet (5 mg total) by mouth daily. 90 tablet 1   No current facility-administered medications for this visit.     Past Surgical History:  Procedure Laterality Date  . BACK SURGERY  2009   DISCECTOMY  . CERVICAL FUSION  2008  . COLONOSCOPY  2005   Dr. Madilyn Fireman: normal  . COLONOSCOPY  2010   Dr. Jena Gauss, +Polyps but no path available. Surveillance 5 years  . COLONOSCOPY  2016   Dr. Madilyn Fireman  . COLONOSCOPY WITH PROPOFOL N/A 01/15/2020   Procedure: COLONOSCOPY WITH PROPOFOL;  Surgeon: Corbin Ade, MD;  [facility];  Service: Endoscopy;  Laterality: N/A;  10:15am  . CORONARY STENT INTERVENTION N/A 04/09/2017   Procedure: CORONARY  STENT INTERVENTION;  Surgeon: Yvonne Kendall, MD;  Location: [facility];  Service: Cardiovascular;  Laterality: N/A;  . CORONARY STENT INTERVENTION N/A 04/12/2017   Procedure: CORONARY STENT INTERVENTION;  Surgeon: Yvonne Kendall, MD;  [facility]: [facility];  Service: Cardiovascular;  Laterality: N/A;  . INTRAVASCULAR ULTRASOUND/IVUS N/A 04/12/2017   Procedure: Intravascular Ultrasound/IVUS;  Surgeon: Yvonne Kendall, MD;  [facility];  Service: Cardiovascular;  Laterality: N/A;  . LEFT HEART CATH AND CORONARY ANGIOGRAPHY N/A 04/09/2017   Procedure: LEFT HEART CATH AND CORONARY ANGIOGRAPHY;  Surgeon: Yvonne Kendall, MD;  [facility];  Service: Cardiovascular;  Laterality: N/A;  . TOTAL HIP ARTHROPLASTY Right 12/22/2013   Procedure: RIGHT TOTAL HIP ARTHROPLASTY ANTERIOR APPROACH;  Surgeon: Shelda Pal, MD;  Location: [facility];  Service: Orthopedics;  Laterality: Right;  . TOTAL HIP ARTHROPLASTY Left 04/30/2015   Procedure: LEFT TOTAL HIP ARTHROPLASTY ANTERIOR APPROACH;  Surgeon: Durene Romans, MD;  Location: [facility];  Service: Orthopedics;  Laterality: Left;     Allergies  Allergen Reactions  . Lyrica [Pregabalin]     "went out of head"      Family History  Problem Relation Age of Onset  . Breast cancer Mother   . Hypertension Mother   . Hyperlipidemia Mother   . Heart attack Mother 48  . Stroke Mother   . Hypertension Paternal Grandmother   . Diabetes Paternal Grandmother   . Cirrhosis Sister   . Cancer Brother   . Malignant hyperthermia Brother   . Colon cancer Neg Hx      Social History Mr. Joslyn reports that he has never smoked. He has never used smokeless tobacco. Mr. Broadfoot reports no history of alcohol use.   Review of Systems CONSTITUTIONAL: No weight loss, fever, chills, weakness or fatigue.  HEENT: Eyes: No visual loss, blurred vision, double vision or yellow sclerae.No hearing loss, sneezing, congestion, runny  nose or sore throat.  SKIN: No rash or itching.  CARDIOVASCULAR: per hpi RESPIRATORY: No shortness of breath, cough or sputum.  GASTROINTESTINAL: No anorexia, nausea, vomiting or diarrhea. No abdominal pain or blood.  GENITOURINARY: No burning on urination, no polyuria NEUROLOGICAL: No headache, dizziness, syncope, paralysis, ataxia, numbness or tingling in the extremities. No change in bowel or bladder control.  MUSCULOSKELETAL: No muscle, back pain, joint pain or stiffness.  LYMPHATICS: No enlarged nodes. No history of splenectomy.  PSYCHIATRIC: No history of depression or anxiety.  ENDOCRINOLOGIC: No reports of sweating, cold or heat intolerance. No polyuria or polydipsia.  Marland Kitchen   Physical Examination Today's Vitals   08/15/20 1109  BP: 132/78  Pulse: (!) 59  SpO2: 96%  Weight: 173 lb 6.4 oz (78.7 kg)  Height:  5\' 8"  (1.727 m)   Body mass index is 26.37 kg/m.  Gen: resting comfortably, no acute distress HEENT: no scleral icterus, pupils equal round and reactive, no palptable cervical adenopathy,  CV: RRR, no mr/g, no jvd Resp: Clear to auscultation bilaterally GI: abdomen is soft, non-tender, non-distended, normal bowel sounds, no hepatosplenomegaly MSK: extremities are warm, no edema.  Skin: warm, no rash Neuro:  no focal deficits Psych: appropriate affect   Diagnostic Studies Jan 2019 cath Conclusions: 1. Significant two-vessel coronary artery disease with 80-90% mid LAD stenosis and occluded mid LCx. 2. Mild to moderate right coronary artery disease. Proximal and mid portions of the LAD and RCA are ectatic/aneurysmal. 3. Mildly to moderately reduced left ventricular contraction with mid anterolateral hypokinesis. Low normal LVEDP. 4. Successful PCI to mid LCx using Resolute Onyx 2.0 x 2.0 x 22 mm DES (post-dilated to 2.6 mm proximally) with 0% residual stenosis and TIMI-3 flow.  Recommendations: 1. Plan for staged PCI to LAD next week, as renal function  allows. 2. ASA and ticagrelor given today; recommend switching ticagrelor to clopidogrel prior to discharge. Continue warfarin, clopidogrel, and aspirin x 1 month, then discontinue aspirin and complete at least 12 months of warfarin and clopidogrel. 3. Aggressive secondary prevention.   Jan 2019 cath staged PCI Conclusions: 1. Aneurysmal LAD with sequential moderate to severe stenosis involving the proximal and mid portions of the vessel. 2. Patent stent in the mid LCx. 3. Successful IVUS-guided PCI to the mid LAD with placement of non-overlapping Resolute Onyx 3.5 x 8 mm (proximal) and Resolute Onyx 2.75 x 38 mm (distal) drug-eluting stents with 0% residual stenosis and TIMI-3 flow.  Recommendations: 1. Transition from ticagrelor to clopidogrel; will load with clopidogrel 300 mg x 1 tomorrow morning, followed by 75 mg daily thereafter. 2. Resume warfarin per pharmacy tonight. Patient and his family report that he has been bridged in the past for procedures requiring cessation of warfarin. If there is no evidence of bleeding or vascular injury tomorrow, Lovenox bridge should be started tomorrow morning per pharmacy. 3. Continue aspirin, clopidogrel, and warfarin x 1 month. If INR therapeutic/stable, aspirin can be discontinued at that time. 4. Aggressive secondary prevention.  Jan 2019 echo Study Conclusions  - Left ventricle: The cavity size was normal. Wall thickness was normal. Systolic function was normal. The estimated ejection fraction was in the range of 60% to 65%. Wall motion was normal; there were no regional wall motion abnormalities. Doppler parameters are consistent with abnormal left ventricular relaxation (grade 1 diastolic dysfunction). The E/e&' ratio is between 8-15, suggesting indeterminate LV filling pressure. - Aortic valve: Trileaflet. Sclerosis without stenosis. There was trivial regurgitation. - Mitral valve: Mildly thickened leaflets . There  was trivial regurgitation. - Left atrium: The atrium was mildly dilated. - Tricuspid valve: There was trivial regurgitation. - Pulmonary arteries: PA peak pressure: 18 mm Hg (S). - Inferior vena cava: The vessel was normal in size. The respirophasic diameter changes were in the normal range (>= 50%), consistent with normal central venous pressure.  Impressions:  - LVEF 60-65%, normal wall thickness, normal wall motion, grade 1 DD, indeterminate LV filling pressure, aortic valve sclerosis with trivial AI, trivial MR, mild LAE, trivial TR, RVSP 18 mmHg, normal IVC.    Assessment and Plan  1. CAD - diarrhea on beta blockers and statins, both stoped - no recent symptoms, continue current meds  2. History of DVT/PE - continue coumadin, on lifelong per heme recs  3. Hyperlipidemia - did not  tolerate statins. On zetia LDL is reasonable, if elevation in LDL over time would need to consider repatha  F/u 6 months     [name], M.D.

## 2020-08-15 NOTE — Patient Instructions (Signed)
Medication Instructions:  Continue all current medications.   Labwork: none  Testing/Procedures: none  Follow-Up: 6 months   Any Other Special Instructions Will Be Listed Below (If Applicable).   If you need a refill on your cardiac medications before your next appointment, please call your pharmacy.  

## 2020-08-16 ENCOUNTER — Other Ambulatory Visit: Payer: Self-pay

## 2020-08-16 ENCOUNTER — Other Ambulatory Visit (INDEPENDENT_AMBULATORY_CARE_PROVIDER_SITE_OTHER): Payer: BC Managed Care – PPO | Admitting: *Deleted

## 2020-08-16 DIAGNOSIS — Z7901 Long term (current) use of anticoagulants: Secondary | ICD-10-CM | POA: Diagnosis not present

## 2020-08-16 LAB — POCT INR: INR: 2.5 (ref 2.0–3.0)

## 2020-08-16 NOTE — Patient Instructions (Signed)
Coumadin 5mg  tablets.  take one tablet every day except take 1/2 tablet on Thursdays and recheck INR in 4 weeks.

## 2020-09-13 ENCOUNTER — Other Ambulatory Visit (INDEPENDENT_AMBULATORY_CARE_PROVIDER_SITE_OTHER): Payer: BC Managed Care – PPO | Admitting: *Deleted

## 2020-09-13 ENCOUNTER — Other Ambulatory Visit: Payer: Self-pay

## 2020-09-13 DIAGNOSIS — Z7901 Long term (current) use of anticoagulants: Secondary | ICD-10-CM | POA: Diagnosis not present

## 2020-09-13 LAB — POCT INR: INR: 2.9 (ref 2.0–3.0)

## 2020-09-13 NOTE — Patient Instructions (Signed)
Skip today's dose of coumadin (09/13/20) then go back to taking one tablet every day except one half on Thursday. Recheck INR in 4 weeks.

## 2020-10-11 ENCOUNTER — Other Ambulatory Visit: Payer: Self-pay

## 2020-10-11 ENCOUNTER — Other Ambulatory Visit: Payer: Self-pay | Admitting: Family Medicine

## 2020-10-11 ENCOUNTER — Other Ambulatory Visit (INDEPENDENT_AMBULATORY_CARE_PROVIDER_SITE_OTHER): Payer: BC Managed Care – PPO | Admitting: *Deleted

## 2020-10-11 DIAGNOSIS — Z7901 Long term (current) use of anticoagulants: Secondary | ICD-10-CM | POA: Diagnosis not present

## 2020-10-11 LAB — POCT INR: INR: 2.8 (ref 2.0–3.0)

## 2020-10-18 ENCOUNTER — Ambulatory Visit: Payer: BC Managed Care – PPO | Admitting: Internal Medicine

## 2020-11-07 ENCOUNTER — Other Ambulatory Visit (INDEPENDENT_AMBULATORY_CARE_PROVIDER_SITE_OTHER): Payer: BC Managed Care – PPO

## 2020-11-07 ENCOUNTER — Other Ambulatory Visit: Payer: Self-pay

## 2020-11-07 DIAGNOSIS — Z7901 Long term (current) use of anticoagulants: Secondary | ICD-10-CM | POA: Diagnosis not present

## 2020-11-07 LAB — POCT INR: INR: 2.6 (ref 2.0–3.0)

## 2020-11-08 ENCOUNTER — Other Ambulatory Visit: Payer: BC Managed Care – PPO

## 2020-11-29 ENCOUNTER — Encounter: Payer: Self-pay | Admitting: Internal Medicine

## 2020-11-29 ENCOUNTER — Ambulatory Visit: Payer: BC Managed Care – PPO | Admitting: Internal Medicine

## 2020-11-29 ENCOUNTER — Other Ambulatory Visit: Payer: Self-pay

## 2020-11-29 VITALS — BP 122/56 | HR 59 | Temp 97.7°F | Resp 18 | Ht 68.0 in | Wt 171.0 lb

## 2020-11-29 DIAGNOSIS — Z86711 Personal history of pulmonary embolism: Secondary | ICD-10-CM | POA: Diagnosis not present

## 2020-11-29 DIAGNOSIS — Z23 Encounter for immunization: Secondary | ICD-10-CM

## 2020-11-29 DIAGNOSIS — N529 Male erectile dysfunction, unspecified: Secondary | ICD-10-CM | POA: Diagnosis not present

## 2020-11-29 DIAGNOSIS — Z7901 Long term (current) use of anticoagulants: Secondary | ICD-10-CM

## 2020-11-29 DIAGNOSIS — I2510 Atherosclerotic heart disease of native coronary artery without angina pectoris: Secondary | ICD-10-CM

## 2020-11-29 DIAGNOSIS — Z96643 Presence of artificial hip joint, bilateral: Secondary | ICD-10-CM

## 2020-11-29 DIAGNOSIS — Z7689 Persons encountering health services in other specified circumstances: Secondary | ICD-10-CM | POA: Diagnosis not present

## 2020-11-29 DIAGNOSIS — Z9861 Coronary angioplasty status: Secondary | ICD-10-CM

## 2020-11-29 NOTE — Assessment & Plan Note (Signed)
On Coumadin due to history of DVT and PE Did not tolerate statin in the past On Zetia Followed by Cardiology.

## 2020-11-29 NOTE — Assessment & Plan Note (Signed)
In 2009 Unclear if provoked or unprovoked, but has been taking Coumadin since 2009 Had limited mobility in 2009 due to severe back pain, which could be provoking factor. Has had coagulation work-up in the past, had Hematology evaluation in the past as well Had lengthy discussion about switching to a NOAC/DOAC, but patient prefers to take Coumadin for now.  Will continue to check INR with goal between 2-3. Offered Hematology referral, but he wants to think about it.

## 2020-11-29 NOTE — Assessment & Plan Note (Signed)
Takes Coumadin for DVT/PE Check INR 

## 2020-11-29 NOTE — Assessment & Plan Note (Signed)
Takes Sildenafil PRN ?

## 2020-11-29 NOTE — Progress Notes (Signed)
New Patient Office Visit  Subjective:  Patient ID: [name]., male    DOB: 20-Oct-1945  Age: 75 y.o. MRN: [id number]  CC:  Chief Complaint  Patient presents with   New Patient (Initial Visit)    New patient was seeing dr Gerda Diss just establishing care     HPI [name]. Is a 75 y.o. male with PMH of CAD s/p stent placement, DVT/PE in 2009 -on Coumadin, erectile dysfunction, avascular necrosis of hip s/p THA, HLD and colonic polyps who presents for establishing care. He is a former patient of Dr Luking/Dr Ladona Ridgel.  He had stent placement in 2019 due to NSTEMI.  He follows up with Cardiology. He also takes lisinopril 5 Mg daily.  His blood pressure is well controlled. Denies any chest pain, dyspnea on palpitations currently.   He had DVT/PE in 2009, and has been taking Coumadin since then.  He has had coagulation work-up with hematology in the past.  He was advised to start NOAC/DOAC at that time, but he preferred to stay on Coumadin.  He still wants to continue taking Coumadin for now.  He does have easy bruising.  Denies any active bleeding currently.  He gets INR checked every month.  He takes Zetia for HLD.  He did not tolerate statin in the past.  He has had 2 doses of COVID-vaccine.  He had flu vaccine in the office today.    Past Medical History:  Diagnosis Date   Anticoagulated on Coumadin    Avascular necrosis of hip (HCC)    LEFT   DVT (deep venous thrombosis) (HCC) 2009   Dyslipidemia, goal LDL below 70 04/09/2017   Family history of anesthesia complication    BROTHER HAD MALIGNANT HYPOTHERMIA - PT HAS NOT HAD ANY PROBLEMS WITH ANESTHESIA     History of kidney stones    Malignant hyperthermia    PT'S BROTHER HAS HX MALIGNANT HYPERTHERMIA   NSTEMI (non-ST elevated myocardial infarction) (HCC) 04/09/2017   Pulmonary embolism (HCC) 2009   Vertigo    NO RECENT PROBLEMS    Past Surgical History:  Procedure Laterality Date   BACK SURGERY  2009    DISCECTOMY   CERVICAL FUSION  2008   COLONOSCOPY  2005   Dr. Madilyn Fireman: normal   COLONOSCOPY  2010   Dr. Jena Gauss, +Polyps but no path available. Surveillance 5 years   COLONOSCOPY  2016   Dr. Madilyn Fireman   COLONOSCOPY WITH PROPOFOL N/A 01/15/2020   Procedure: COLONOSCOPY WITH PROPOFOL;  Surgeon: Corbin Ade, MD;  [facility]: [facility];  Service: Endoscopy;  Laterality: N/A;  10:15am   CORONARY STENT INTERVENTION N/A 04/09/2017   Procedure: CORONARY STENT INTERVENTION;  Surgeon: Yvonne Kendall, MD;  [facility];  Service: Cardiovascular;  Laterality: N/A;   CORONARY STENT INTERVENTION N/A 04/12/2017   Procedure: CORONARY STENT INTERVENTION;  Surgeon: Yvonne Kendall, MD;  [facility];  Service: Cardiovascular;  Laterality: N/A;   INTRAVASCULAR ULTRASOUND/IVUS N/A 04/12/2017   Procedure: Intravascular Ultrasound/IVUS;  Surgeon: Yvonne Kendall, MD;  [facility];  Service: Cardiovascular;  Laterality: N/A;   LEFT HEART CATH AND CORONARY ANGIOGRAPHY N/A 04/09/2017   Procedure: LEFT HEART CATH AND CORONARY ANGIOGRAPHY;  Surgeon: Yvonne Kendall, MD;  [facility];  Service: Cardiovascular;  Laterality: N/A;   TOTAL HIP ARTHROPLASTY Right 12/22/2013   Procedure: RIGHT TOTAL HIP ARTHROPLASTY ANTERIOR APPROACH;  Surgeon: Shelda Pal, MD;  Location: [facility];  Service: Orthopedics;  Laterality: Right;   TOTAL HIP ARTHROPLASTY Left 04/30/2015   Procedure: LEFT TOTAL HIP ARTHROPLASTY ANTERIOR APPROACH;  Surgeon: Durene Romans, MD;  Location: [facility];  Service: Orthopedics;  Laterality: Left;    Family History  Problem Relation Age of Onset   Breast cancer Mother    Hypertension Mother    Hyperlipidemia Mother    Heart attack Mother 55   Stroke Mother    Hypertension Paternal Grandmother    Diabetes Paternal Grandmother    Cirrhosis Sister    Cancer Brother    Malignant hyperthermia Brother    Colon cancer Neg Hx     Social History    Socioeconomic History   Marital status: Married    Spouse name: Not on file   Number of children: Not on file   Years of education: Not on file   Highest education level: Not on file  Occupational History   Occupation: self employed, Fish farm manager  Tobacco Use   Smoking status: Never   Smokeless tobacco: Never  Vaping Use   Vaping Use: Never used  Substance and Sexual Activity   Alcohol use: No   Drug use: No   Sexual activity: Not on file  Other Topics Concern   Not on file  Social History Narrative   Not on file   Social Determinants of Health   Financial Resource Strain: Not on file  Food Insecurity: Not on file  Transportation Needs: Not on file  Physical Activity: Not on file  Stress: Not on file  Social Connections: Not on file  Intimate Partner Violence: Not on file    ROS Review of Systems  Constitutional:  Negative for chills and fever.  HENT:  Negative for congestion and sore throat.   Eyes:  Negative for pain and discharge.  Respiratory:  Negative for cough and shortness of breath.   Cardiovascular:  Negative for chest pain and palpitations.  Gastrointestinal:  Negative for constipation, diarrhea, nausea and vomiting.  Endocrine: Negative for polydipsia and polyuria.  Genitourinary:  Negative for dysuria and hematuria.  Musculoskeletal:  Negative for neck pain and neck stiffness.  Skin:  Negative for rash.  Neurological:  Negative for dizziness, weakness, numbness and headaches.  Hematological:  Bruises/bleeds easily.  Psychiatric/Behavioral:  Negative for agitation and behavioral problems.    Objective:   Today's Vitals: BP (!) 122/56 (BP Location: Left Arm, Patient Position: Sitting, Cuff Size: Normal)   Pulse (!) 59   Temp 97.7 F (36.5 C) (Oral)   Resp 18   Ht 5\' 8"  (1.727 m)   Wt 171 lb 0.6 oz (77.6 kg)   SpO2 98%   BMI 26.01 kg/m   Physical Exam Vitals reviewed.  Constitutional:      General: He is not in acute distress.     Appearance: He is not diaphoretic.  HENT:     Head: Normocephalic and atraumatic.     Nose: Nose normal.     Mouth/Throat:     Mouth: Mucous membranes are moist.  Eyes:     General: No scleral icterus.    Extraocular Movements: Extraocular movements intact.  Cardiovascular:     Rate and Rhythm: Normal rate and regular rhythm.     Pulses: Normal pulses.     Heart sounds: Normal heart sounds. No murmur heard. Pulmonary:     Breath sounds: Normal breath sounds. No wheezing or rales.  Abdominal:     Palpations: Abdomen is soft.  Tenderness: There is no abdominal tenderness.  Musculoskeletal:     Cervical back: Neck supple. No tenderness.     Right lower leg: No edema.     Left lower leg: No edema.  Skin:    General: Skin is warm.     Findings: Bruising (Scattered over b/l UE and LE) present. No rash.  Neurological:     General: No focal deficit present.     Mental Status: He is alert and oriented to person, place, and time.     Sensory: No sensory deficit.     Motor: No weakness.  Psychiatric:        Mood and Affect: Mood normal.        Behavior: Behavior normal.    Assessment & Plan:   Problem List Items Addressed This Visit       Encounter to establish care    -  Primary Care established Previous chart reviewed History and medications reviewed with the patient  Cardiovascular and Mediastinum   CAD S/P percutaneous coronary angioplasty    On Coumadin due to history of DVT and PE Did not tolerate statin in the past On Zetia Followed by Cardiology.        Other   Long term (current) use of anticoagulants    Takes Coumadin for DVT/PE Check INR      Relevant Orders   INR/PT   Erectile dysfunction    Takes Sildenafil PRN      Status post total replacement of both hips   History of pulmonary embolism    In 2009 Unclear if provoked or unprovoked, but has been taking Coumadin since 2009 Had limited mobility in 2009 due to severe back pain, which could  be provoking factor. Has had coagulation work-up in the past, had Hematology evaluation in the past as well Had lengthy discussion about switching to a NOAC/DOAC, but patient prefers to take Coumadin for now.  Will continue to check INR with goal between 2-3. Offered Hematology referral, but he wants to think about it.      Other Visit Diagnoses        Need for immunization against influenza       Relevant Orders   Flu Vaccine QUAD High Dose(Fluad) (Completed)       Outpatient Encounter Medications as of 11/29/2020  Medication Sig   acetaminophen (TYLENOL) 500 MG tablet Take 2 tablets (1,000 mg total) by mouth every 8 (eight) hours. (Patient taking differently: Take 1,000 mg by mouth every 8 (eight) hours as needed (pain/headaches.).)   dorzolamide (TRUSOPT) 2 % ophthalmic solution Place 1 drop into the right eye in the morning and at bedtime.   ezetimibe (ZETIA) 10 MG tablet TAKE 1 TABLET BY MOUTH EVERY DAY   JANTOVEN 5 MG tablet TAKE 1 TABLET DAILY   lisinopril (ZESTRIL) 5 MG tablet TAKE 1 TABLET BY MOUTH EVERY DAY (Patient taking differently: Take 5 mg by mouth at bedtime.)   nitroGLYCERIN (NITROSTAT) 0.4 MG SL tablet 1 TAB UNDER TONGUE EVERY 5 MINUTES X3 DOSES AS NEED CHEST PAIN. IF NO RELIEF AFTER 1ST DOSE GO TO ER   sildenafil (REVATIO) 20 MG tablet TAKE 2 TO 3 TABLETS BY MOUTH AS NEEDED 30 MINUTES BEFORE INTERCOURSE (Patient taking differently: Take 40-60 mg by mouth daily as needed (erectile dysfunction.).)   guaiFENesin-codeine 100-10 MG/5ML syrup Take 5 mLs by mouth 3 (three) times daily as needed for cough. (Patient not taking: Reported on 11/29/2020)   No facility-administered encounter medications on file  as of 11/29/2020.    Follow-up: Return in about 3 months (around 02/28/2021) for Annual physical.   Anabel Halon, MD

## 2020-11-29 NOTE — Patient Instructions (Addendum)
Please continue taking medications as prescribed.  Please consider about switching to a NOAC like Xarelto or Eliquis. Please let us know if you would prefer to be referred to Hematologist.  Please continue to follow heart healthy diet and ambulate as tolerated.

## 2020-12-05 ENCOUNTER — Other Ambulatory Visit: Payer: Self-pay | Admitting: Internal Medicine

## 2020-12-05 ENCOUNTER — Other Ambulatory Visit: Payer: BC Managed Care – PPO

## 2020-12-05 DIAGNOSIS — Z7901 Long term (current) use of anticoagulants: Secondary | ICD-10-CM | POA: Diagnosis not present

## 2020-12-05 DIAGNOSIS — Z86711 Personal history of pulmonary embolism: Secondary | ICD-10-CM

## 2020-12-06 ENCOUNTER — Other Ambulatory Visit: Payer: Self-pay | Admitting: Cardiology

## 2020-12-06 LAB — PROTIME-INR
INR: 2 — ABNORMAL HIGH (ref 0.9–1.2)
Prothrombin Time: 19.9 s — ABNORMAL HIGH (ref 9.1–12.0)

## 2020-12-26 DIAGNOSIS — H401232 Low-tension glaucoma, bilateral, moderate stage: Secondary | ICD-10-CM | POA: Diagnosis not present

## 2021-01-07 ENCOUNTER — Other Ambulatory Visit: Payer: Self-pay | Admitting: Family Medicine

## 2021-01-08 ENCOUNTER — Other Ambulatory Visit: Payer: Self-pay | Admitting: Internal Medicine

## 2021-01-08 DIAGNOSIS — Z86711 Personal history of pulmonary embolism: Secondary | ICD-10-CM | POA: Diagnosis not present

## 2021-01-08 DIAGNOSIS — Z7901 Long term (current) use of anticoagulants: Secondary | ICD-10-CM | POA: Diagnosis not present

## 2021-01-09 ENCOUNTER — Telehealth: Payer: Self-pay

## 2021-01-09 LAB — PROTIME-INR
INR: 3.1 — ABNORMAL HIGH (ref 0.9–1.2)
Prothrombin Time: 30.8 s — ABNORMAL HIGH (ref 9.1–12.0)

## 2021-01-09 NOTE — Telephone Encounter (Signed)
Called patient back regarding the question he had on his 11/29/20 visit.  He was charged a [phone] and high dose med due to his age.  He has more DX and history review than his wife that is why the OV level was different.  He is 40 and received high dose med and his wife is 67 and received low dose med that is why the med cost was different.  They both had 59.00 admin fee which on his eob looked like 2 injections.  I assured him one on the line items was for admin of the injection and the other line item was the medication feel.  All questions were answered and he seems to understand.

## 2021-01-13 ENCOUNTER — Other Ambulatory Visit: Payer: Self-pay | Admitting: Family Medicine

## 2021-01-21 ENCOUNTER — Telehealth: Payer: Self-pay | Admitting: Internal Medicine

## 2021-01-21 ENCOUNTER — Other Ambulatory Visit: Payer: Self-pay | Admitting: *Deleted

## 2021-01-21 MED ORDER — WARFARIN SODIUM 5 MG PO TABS: 5.0000 mg | ORAL_TABLET | Freq: Every day | ORAL | 0 refills | Status: DC

## 2021-01-21 NOTE — Telephone Encounter (Signed)
PT called for refill on JANTOVEN 90 day supply,  Pt states pharm has sent over authorization and did  not get any response  ( [facility] )  Originally prescribed from [employer] , pt no longer with officer

## 2021-01-21 NOTE — Telephone Encounter (Signed)
Medication sent to pharmacy  

## 2021-02-04 ENCOUNTER — Other Ambulatory Visit: Payer: Self-pay | Admitting: Internal Medicine

## 2021-02-04 DIAGNOSIS — Z7901 Long term (current) use of anticoagulants: Secondary | ICD-10-CM | POA: Diagnosis not present

## 2021-02-04 DIAGNOSIS — Z86711 Personal history of pulmonary embolism: Secondary | ICD-10-CM | POA: Diagnosis not present

## 2021-02-05 LAB — PROTIME-INR
INR: 2.4 — ABNORMAL HIGH (ref 0.9–1.2)
Prothrombin Time: 24 s — ABNORMAL HIGH (ref 9.1–12.0)

## 2021-02-18 ENCOUNTER — Ambulatory Visit: Payer: BC Managed Care – PPO | Admitting: Cardiology

## 2021-03-05 ENCOUNTER — Telehealth: Payer: Self-pay

## 2021-03-05 ENCOUNTER — Other Ambulatory Visit: Payer: Self-pay | Admitting: Internal Medicine

## 2021-03-05 DIAGNOSIS — R7303 Prediabetes: Secondary | ICD-10-CM

## 2021-03-05 DIAGNOSIS — Z125 Encounter for screening for malignant neoplasm of prostate: Secondary | ICD-10-CM

## 2021-03-05 DIAGNOSIS — E559 Vitamin D deficiency, unspecified: Secondary | ICD-10-CM

## 2021-03-05 DIAGNOSIS — R972 Elevated prostate specific antigen [PSA]: Secondary | ICD-10-CM

## 2021-03-05 DIAGNOSIS — Z0001 Encounter for general adult medical examination with abnormal findings: Secondary | ICD-10-CM

## 2021-03-05 DIAGNOSIS — E785 Hyperlipidemia, unspecified: Secondary | ICD-10-CM

## 2021-03-05 DIAGNOSIS — N529 Male erectile dysfunction, unspecified: Secondary | ICD-10-CM

## 2021-03-05 DIAGNOSIS — Z7901 Long term (current) use of anticoagulants: Secondary | ICD-10-CM

## 2021-03-05 NOTE — Telephone Encounter (Signed)
Pt said he usually gets labs prior to his physical.  I so not see any labs pending in the system.  Should he come in for labs tomorrow prior to his visit next week?

## 2021-03-06 ENCOUNTER — Other Ambulatory Visit: Payer: Self-pay | Admitting: Cardiology

## 2021-03-06 ENCOUNTER — Other Ambulatory Visit: Payer: Self-pay | Admitting: Internal Medicine

## 2021-03-06 DIAGNOSIS — Z86711 Personal history of pulmonary embolism: Secondary | ICD-10-CM | POA: Diagnosis not present

## 2021-03-06 DIAGNOSIS — R972 Elevated prostate specific antigen [PSA]: Secondary | ICD-10-CM | POA: Diagnosis not present

## 2021-03-06 DIAGNOSIS — E559 Vitamin D deficiency, unspecified: Secondary | ICD-10-CM | POA: Diagnosis not present

## 2021-03-06 DIAGNOSIS — Z0001 Encounter for general adult medical examination with abnormal findings: Secondary | ICD-10-CM | POA: Diagnosis not present

## 2021-03-06 DIAGNOSIS — R7303 Prediabetes: Secondary | ICD-10-CM | POA: Diagnosis not present

## 2021-03-06 DIAGNOSIS — Z7901 Long term (current) use of anticoagulants: Secondary | ICD-10-CM | POA: Diagnosis not present

## 2021-03-06 NOTE — Telephone Encounter (Signed)
Pt advised with verbal understanding  °

## 2021-03-07 LAB — PROTIME-INR
INR: 2.8 — ABNORMAL HIGH (ref 0.9–1.2)
Prothrombin Time: 27.6 s — ABNORMAL HIGH (ref 9.1–12.0)

## 2021-03-07 LAB — LIPID PANEL
Chol/HDL Ratio: 5.5 ratio — ABNORMAL HIGH (ref 0.0–5.0)
Cholesterol, Total: 169 mg/dL (ref 100–199)
HDL: 31 mg/dL — ABNORMAL LOW (ref >39)
LDL Chol Calc (NIH): 112 mg/dL — ABNORMAL HIGH (ref 0–99)
Triglycerides: 145 mg/dL (ref 0–149)
VLDL Cholesterol Cal: 26 mg/dL (ref 5–40)

## 2021-03-07 LAB — CMP14+EGFR
ALT: 23 IU/L (ref 0–44)
AST: 22 IU/L (ref 0–40)
Albumin/Globulin Ratio: 1.8 (ref 1.2–2.2)
Albumin: 4.4 g/dL (ref 3.7–4.7)
Alkaline Phosphatase: 69 IU/L (ref 44–121)
BUN/Creatinine Ratio: 11 (ref 10–24)
BUN: 11 mg/dL (ref 8–27)
Bilirubin Total: 0.5 mg/dL (ref 0.0–1.2)
CO2: 23 mmol/L (ref 20–29)
Calcium: 8.8 mg/dL (ref 8.6–10.2)
Chloride: 100 mmol/L (ref 96–106)
Creatinine, Ser: 0.99 mg/dL (ref 0.76–1.27)
Globulin, Total: 2.4 g/dL (ref 1.5–4.5)
Glucose: 87 mg/dL (ref 70–99)
Potassium: 3.6 mmol/L (ref 3.5–5.2)
Sodium: 138 mmol/L (ref 134–144)
Total Protein: 6.8 g/dL (ref 6.0–8.5)
eGFR: 80 mL/min/{1.73_m2} (ref >59)

## 2021-03-07 LAB — CBC WITH DIFFERENTIAL/PLATELET
Basophils Absolute: 0 10*3/uL (ref 0.0–0.2)
Basos: 1 %
EOS (ABSOLUTE): 0.2 10*3/uL (ref 0.0–0.4)
Eos: 2 %
Hematocrit: 39.1 % (ref 37.5–51.0)
Hemoglobin: 13 g/dL (ref 13.0–17.7)
Immature Grans (Abs): 0 10*3/uL (ref 0.0–0.1)
Immature Granulocytes: 0 %
Lymphocytes Absolute: 1.5 10*3/uL (ref 0.7–3.1)
Lymphs: 24 %
MCH: 29 pg (ref 26.6–33.0)
MCHC: 33.2 g/dL (ref 31.5–35.7)
MCV: 87 fL (ref 79–97)
Monocytes Absolute: 0.5 10*3/uL (ref 0.1–0.9)
Monocytes: 8 %
Neutrophils Absolute: 4 10*3/uL (ref 1.4–7.0)
Neutrophils: 65 %
Platelets: 216 10*3/uL (ref 150–450)
RBC: 4.48 x10E6/uL (ref 4.14–5.80)
RDW: 12.6 % (ref 11.6–15.4)
WBC: 6.3 10*3/uL (ref 3.4–10.8)

## 2021-03-07 LAB — VITAMIN D 25 HYDROXY (VIT D DEFICIENCY, FRACTURES): Vit D, 25-Hydroxy: 11.6 ng/mL — ABNORMAL LOW (ref 30.0–100.0)

## 2021-03-07 LAB — TSH: TSH: 1.94 u[IU]/mL (ref 0.450–4.500)

## 2021-03-07 LAB — HEMOGLOBIN A1C
Est. average glucose Bld gHb Est-mCnc: 123 mg/dL
Hgb A1c MFr Bld: 5.9 % — ABNORMAL HIGH (ref 4.8–5.6)

## 2021-03-07 LAB — PSA: Prostate Specific Ag, Serum: 2.8 ng/mL (ref 0.0–4.0)

## 2021-03-10 ENCOUNTER — Encounter: Payer: Self-pay | Admitting: Internal Medicine

## 2021-03-10 ENCOUNTER — Other Ambulatory Visit: Payer: Self-pay

## 2021-03-10 ENCOUNTER — Ambulatory Visit (INDEPENDENT_AMBULATORY_CARE_PROVIDER_SITE_OTHER): Payer: BC Managed Care – PPO | Admitting: Internal Medicine

## 2021-03-10 VITALS — BP 138/78 | HR 72 | Resp 18 | Ht 68.0 in | Wt 171.0 lb

## 2021-03-10 DIAGNOSIS — Z789 Other specified health status: Secondary | ICD-10-CM

## 2021-03-10 DIAGNOSIS — J019 Acute sinusitis, unspecified: Secondary | ICD-10-CM | POA: Diagnosis not present

## 2021-03-10 DIAGNOSIS — Z7901 Long term (current) use of anticoagulants: Secondary | ICD-10-CM

## 2021-03-10 DIAGNOSIS — Z0001 Encounter for general adult medical examination with abnormal findings: Secondary | ICD-10-CM | POA: Diagnosis not present

## 2021-03-10 MED ORDER — AMOXICILLIN-POT CLAVULANATE 875-125 MG PO TABS: 1.0000 | ORAL_TABLET | Freq: Two times a day (BID) | ORAL | 0 refills | Status: DC

## 2021-03-10 MED ORDER — SALINE NASAL SPRAY 0.65 % NA SOLN: 1.0000 | NASAL | 0 refills | Status: DC | PRN

## 2021-03-10 NOTE — Assessment & Plan Note (Signed)

## 2021-03-10 NOTE — Patient Instructions (Addendum)
Please continue to take medications as prescribed.  Please start taking Augmentin for sinusitis. Please use Nasal saline spray for nasal congestion.  Please start taking Vitamin D 5000 IU once daily.

## 2021-03-10 NOTE — Assessment & Plan Note (Signed)
Takes Coumadin for DVT/PE Check INR 

## 2021-03-10 NOTE — Assessment & Plan Note (Signed)
Has had severe GI discomfort and myalgias with statin in the past On Zetia for HLD

## 2021-03-10 NOTE — Progress Notes (Addendum)
Established Patient Office Visit  Subjective:  Patient ID: [name]., male    DOB: 1946-02-08  Age: 75 y.o. MRN: [id number]  CC:  Chief Complaint  Patient presents with   Annual Exam    Annual exam pt has had sinus drainage for about 2 weeks now     HPI [name]. is a 75 y.o. male with past medical history of CAD s/p stent placement, DVT/PE in 2009 -on Coumadin, erectile dysfunction, avascular necrosis of hip s/p THA, HLD and colonic polyps who presents for annual physical.  He c/o nasal congestion and postnasal drip for the last 2 weeks.  His wife was sick with flu about 2 weeks ago.  He currently denies any fever, chills, dyspnea or wheezing.  He had DVT/PE in 2009, and has been taking Coumadin since then. He still wants to continue taking Coumadin for now.  He does have easy bruising.  Denies any active bleeding currently.  He gets INR checked every month.  Blood tests were reviewed and discussed with the patient in detail. He takes Zetia for HLD.  He did not tolerate statin in the past.   He has had 2 doses of COVID-vaccine.  Past Medical History:  Diagnosis Date   Anticoagulated on Coumadin    Avascular necrosis of hip ([place])    LEFT   DVT (deep venous thrombosis) ([place]) 2009   Dyslipidemia, goal LDL below 70 04/09/2017   Family history of anesthesia complication    BROTHER HAD MALIGNANT HYPOTHERMIA - PT HAS NOT HAD ANY PROBLEMS WITH ANESTHESIA     History of kidney stones    Malignant hyperthermia    PT'S BROTHER HAS HX MALIGNANT HYPERTHERMIA   NSTEMI (non-ST elevated myocardial infarction) ([place]) 04/09/2017   Pulmonary embolism ([place]) 2009   Vertigo    NO RECENT PROBLEMS    Past Surgical History:  Procedure Laterality Date   BACK SURGERY  2009   DISCECTOMY   CERVICAL FUSION  2008   COLONOSCOPY  2005   Dr. Amedeo Plenty: normal   COLONOSCOPY  2010   Dr. Gala Romney, +Polyps but no path available. Surveillance 5 years   COLONOSCOPY  2016   Dr. Amedeo Plenty    COLONOSCOPY WITH PROPOFOL N/A 01/15/2020   Procedure: COLONOSCOPY WITH PROPOFOL;  Surgeon: Daneil Dolin, MD;  [facility];  Service: Endoscopy;  Laterality: N/A;  10:15am   CORONARY STENT INTERVENTION N/A 04/09/2017   Procedure: CORONARY STENT INTERVENTION;  Surgeon: Nelva Bush, MD;  Location: [facility];  Service: Cardiovascular;  Laterality: N/A;   CORONARY STENT INTERVENTION N/A 04/12/2017   Procedure: CORONARY STENT INTERVENTION;  Surgeon: Nelva Bush, MD;  Location: [facility];  Service: Cardiovascular;  Laterality: N/A;   INTRAVASCULAR ULTRASOUND/IVUS N/A 04/12/2017   Procedure: Intravascular Ultrasound/IVUS;  Surgeon: Nelva Bush, MD;  Location: [facility];  Service: Cardiovascular;  Laterality: N/A;   LEFT HEART CATH AND CORONARY ANGIOGRAPHY N/A 04/09/2017   Procedure: LEFT HEART CATH AND CORONARY ANGIOGRAPHY;  Surgeon: Nelva Bush, MD;  Location: [facility];  Service: Cardiovascular;  Laterality: N/A;   TOTAL HIP ARTHROPLASTY Right 12/22/2013   Procedure: RIGHT TOTAL HIP ARTHROPLASTY ANTERIOR APPROACH;  Surgeon: Mauri Pole, MD;  Location: [facility];  Service: Orthopedics;  Laterality: Right;   TOTAL HIP ARTHROPLASTY Left 04/30/2015   Procedure: LEFT TOTAL HIP ARTHROPLASTY ANTERIOR APPROACH;  Surgeon: Paralee Cancel, MD;  Location: [facility];  Service: Orthopedics;  Laterality: Left;  Family History  Problem Relation Age of Onset   Breast cancer Mother    Hypertension Mother    Hyperlipidemia Mother    Heart attack Mother 31   Stroke Mother    Hypertension Paternal Grandmother    Diabetes Paternal Grandmother    Cirrhosis Sister    Cancer Brother    Malignant hyperthermia Brother    Colon cancer Neg Hx     Social History   Socioeconomic History   Marital status: Married    Spouse name: Not on file   Number of children: Not on file   Years of education: Not on file   Highest education level: Not on file   Occupational History   Occupation: self employed, Forensic scientist  Tobacco Use   Smoking status: Never   Smokeless tobacco: Never  Vaping Use   Vaping Use: Never used  Substance and Sexual Activity   Alcohol use: No   Drug use: No   Sexual activity: Not on file  Other Topics Concern   Not on file  Social History Narrative   Not on file   Social Determinants of Health   Financial Resource Strain: Not on file  Food Insecurity: Not on file  Transportation Needs: Not on file  Physical Activity: Not on file  Stress: Not on file  Social Connections: Not on file  Intimate Partner Violence: Not on file    Outpatient Medications Prior to Visit  Medication Sig Dispense Refill   acetaminophen (TYLENOL) 500 MG tablet Take 2 tablets (1,000 mg total) by mouth every 8 (eight) hours. (Patient taking differently: Take 1,000 mg by mouth every 8 (eight) hours as needed (pain/headaches.).) 30 tablet 0   dorzolamide (TRUSOPT) 2 % ophthalmic solution Place 1 drop into the right eye in the morning and at bedtime.     ezetimibe (ZETIA) 10 MG tablet TAKE 1 TABLET BY MOUTH EVERY DAY 90 tablet 3   guaiFENesin-codeine 100-10 MG/5ML syrup Take 5 mLs by mouth 3 (three) times daily as needed for cough. 120 mL 0   lisinopril (ZESTRIL) 5 MG tablet TAKE 1 TABLET BY MOUTH EVERY DAY 90 tablet 3   nitroGLYCERIN (NITROSTAT) 0.4 MG SL tablet 1 TAB UNDER TONGUE EVERY 5 MINUTES X3 DOSES AS NEED CHEST PAIN. IF NO RELIEF AFTER 1ST DOSE GO TO ER 25 tablet 3   sildenafil (REVATIO) 20 MG tablet TAKE 2 TO 3 TABLETS BY MOUTH AS NEEDED 30 MINUTES BEFORE INTERCOURSE (Patient taking differently: Take 40-60 mg by mouth daily as needed (erectile dysfunction.).) 90 tablet 1   warfarin (JANTOVEN) 5 MG tablet Take 1 tablet (5 mg total) by mouth daily. 90 tablet 0   No facility-administered medications prior to visit.    Allergies  Allergen Reactions   Lyrica [Pregabalin]     "went out of head"    ROS Review of  Systems  Constitutional:  Negative for chills and fever.  HENT:  Positive for congestion and postnasal drip. Negative for sore throat.   Eyes:  Negative for pain and discharge.  Respiratory:  Negative for cough and shortness of breath.   Cardiovascular:  Negative for chest pain and palpitations.  Gastrointestinal:  Negative for constipation, diarrhea, nausea and vomiting.  Endocrine: Negative for polydipsia and polyuria.  Genitourinary:  Negative for dysuria and hematuria.  Musculoskeletal:  Negative for neck pain and neck stiffness.  Skin:  Negative for rash.  Neurological:  Negative for dizziness, weakness, numbness and headaches.  Hematological:  Bruises/bleeds easily.  Psychiatric/Behavioral:  Negative for agitation and behavioral problems.      Objective:    Physical Exam Vitals reviewed.  Constitutional:      General: He is not in acute distress.    Appearance: He is not diaphoretic.  HENT:     Head: Normocephalic and atraumatic.     Nose: Congestion present.     Mouth/Throat:     Mouth: Mucous membranes are moist.  Eyes:     General: No scleral icterus.    Extraocular Movements: Extraocular movements intact.  Cardiovascular:     Rate and Rhythm: Normal rate and regular rhythm.     Pulses: Normal pulses.     Heart sounds: Normal heart sounds. No murmur heard. Pulmonary:     Breath sounds: Normal breath sounds. No wheezing or rales.  Abdominal:     Palpations: Abdomen is soft.     Tenderness: There is no abdominal tenderness.  Musculoskeletal:     Cervical back: Neck supple. No tenderness.     Right lower leg: No edema.     Left lower leg: No edema.  Skin:    General: Skin is warm.     Findings: Bruising (Scattered over b/l UE and LE) present. No rash.  Neurological:     General: No focal deficit present.     Mental Status: He is alert and oriented to person, place, and time.     Sensory: No sensory deficit.     Motor: No weakness.  Psychiatric:        Mood  and Affect: Mood normal.        Behavior: Behavior normal.    BP 138/78 (BP Location: Right Arm, Patient Position: Sitting, Cuff Size: Normal)    Pulse 72    Resp 18    Ht 5' 8"  (1.727 m)    Wt 171 lb (77.6 kg)    SpO2 98%    BMI 26.00 kg/m  Wt Readings from Last 3 Encounters:  03/10/21 171 lb (77.6 kg)  11/29/20 171 lb 0.6 oz (77.6 kg)  08/15/20 173 lb 6.4 oz (78.7 kg)    Lab Results  Component Value Date   TSH 1.940 03/06/2021   Lab Results  Component Value Date   WBC 6.3 03/06/2021   HGB 13.0 03/06/2021   HCT 39.1 03/06/2021   MCV 87 03/06/2021   PLT 216 03/06/2021   Lab Results  Component Value Date   NA 138 03/06/2021   K 3.6 03/06/2021   CO2 23 03/06/2021   GLUCOSE 87 03/06/2021   BUN 11 03/06/2021   CREATININE 0.99 03/06/2021   BILITOT 0.5 03/06/2021   ALKPHOS 69 03/06/2021   AST 22 03/06/2021   ALT 23 03/06/2021   PROT 6.8 03/06/2021   ALBUMIN 4.4 03/06/2021   CALCIUM 8.8 03/06/2021   ANIONGAP 10 04/13/2017   EGFR 80 03/06/2021   Lab Results  Component Value Date   CHOL 169 03/06/2021   Lab Results  Component Value Date   HDL 31 (L) 03/06/2021   Lab Results  Component Value Date   LDLCALC 112 (H) 03/06/2021   Lab Results  Component Value Date   TRIG 145 03/06/2021   Lab Results  Component Value Date   CHOLHDL 5.5 (H) 03/06/2021   Lab Results  Component Value Date   HGBA1C 5.9 (H) 03/06/2021      Assessment & Plan:   Problem List Items Addressed This Visit       Other   Long term (current) use  of anticoagulants    Takes Coumadin for DVT/PE Check INR      Encounter for general adult medical examination with abnormal findings - Primary    Physical exam as documented. Counseling done  re healthy lifestyle involving commitment to 150 minutes exercise per week, heart healthy diet, and attaining healthy weight.The importance of adequate sleep also discussed. Changes in health habits are decided on by the patient with goals and time  frames  set for achieving them. Immunization and cancer screening needs are specifically addressed at this visit.      Statin intolerance    Has had severe GI discomfort and myalgias with statin in the past On Zetia for HLD      Other Visit Diagnoses     Acute sinusitis, recurrence not specified, unspecified location       Relevant Medications   amoxicillin-clavulanate (AUGMENTIN) 875-125 MG tablet   sodium chloride (OCEAN) 0.65 % nasal spray       Meds ordered this encounter  Medications   amoxicillin-clavulanate (AUGMENTIN) 875-125 MG tablet    Sig: Take 1 tablet by mouth 2 (two) times daily.    Dispense:  14 tablet    Refill:  0   sodium chloride (OCEAN) 0.65 % nasal spray    Sig: Place 1 spray into the nose as needed for congestion.    Dispense:  30 mL    Refill:  0    Follow-up: Return in about 6 months (around 09/08/2021) for HTN and HLD.    Lindell Spar, MD

## 2021-03-31 ENCOUNTER — Encounter: Payer: Self-pay | Admitting: Cardiology

## 2021-03-31 ENCOUNTER — Ambulatory Visit: Payer: BC Managed Care – PPO | Admitting: Cardiology

## 2021-03-31 ENCOUNTER — Other Ambulatory Visit: Payer: Self-pay

## 2021-03-31 VITALS — BP 144/80 | Temp 87.0°F | Ht 68.0 in | Wt 173.8 lb

## 2021-03-31 DIAGNOSIS — E782 Mixed hyperlipidemia: Secondary | ICD-10-CM | POA: Diagnosis not present

## 2021-03-31 DIAGNOSIS — I10 Essential (primary) hypertension: Secondary | ICD-10-CM | POA: Diagnosis not present

## 2021-03-31 DIAGNOSIS — I2510 Atherosclerotic heart disease of native coronary artery without angina pectoris: Secondary | ICD-10-CM | POA: Diagnosis not present

## 2021-03-31 MED ORDER — LISINOPRIL 10 MG PO TABS: 10.0000 mg | ORAL_TABLET | Freq: Every day | ORAL | 3 refills | Status: DC

## 2021-03-31 NOTE — Progress Notes (Signed)
Clinical Summary [name] is a 76 y.o.male seen today for follow up of the following medical problems.      1. CAD - admit Jan 2019 with subcascpular pain, found to have NSTEMI peak trop 31 - cath Jan 2019 staged PCI LCX and LAD.  - plans were for triple therapy with asa, plavix, coumdin (for history of PE) x 1 month, then plavix and coumadin - Jan 2019 echo LVEF 60-65%, no wma's, grade I diatolic dysfunction.    - previously came off beta blocker and statin due to diarrhea. He tried taking pravastatin low dose and did so for a few months but had recurrent diarrhea and ended up stopping        - no recent chest pains. No SOB/DOE - compliabnt with meds   2. History of bilateral PE -  occurred after immobility after back surgery in 10/2017 - 09/2013 LE US showed chronic DVT left femoral vein - 06/2017 CT PE no PE - 06/2017 LE venous US: left popliteal vein DVT new compared to 09/2013 study. Chronic DVT left femoral and tibial veins. Chronic DVT left deep femoral vein.   - on life long coumadin from notes, notes indicate this was recommended by hematology in the past - seen again by hematology, decision made to continue anticoagulation.          3. Hyperlipidemia - 11/2018 TC 187 TG 110 HDL 42 LDL 125 - intolerant to statins   - last visit 07/2019 was to start zetia  daily.  - 12/2019 TC 144 TG 134 HDL 32 LDL 88  - reports zetia caused diarrhea. Stopped taking. -   4. 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 Has had covid vaccine moderna x 2     Past Medical History:  Diagnosis Date   Anticoagulated on Coumadin    Avascular necrosis of hip ([facility])    LEFT   DVT (deep venous thrombosis) ([facility]) 2009   Dyslipidemia, goal LDL below 70 04/09/2017   Family history of anesthesia complication    BROTHER HAD MALIGNANT HYPOTHERMIA - PT HAS NOT HAD ANY PROBLEMS WITH ANESTHESIA     History of kidney stones    Malignant  hyperthermia    PT'S BROTHER HAS HX MALIGNANT HYPERTHERMIA   NSTEMI (non-ST elevated myocardial infarction) ([facility]) 04/09/2017   Pulmonary embolism ([facility]) 2009   Vertigo    NO RECENT PROBLEMS     Allergies  Allergen Reactions   Lyrica [Pregabalin]     "went out of head"     Current Outpatient Medications  Medication Sig Dispense Refill   acetaminophen (TYLENOL) 500 MG tablet Take 2 tablets (1,000 mg total) by mouth every 8 (eight) hours. (Patient taking differently: Take 1,000 mg by mouth every 8 (eight) hours as needed (pain/headaches.).) 30 tablet 0   dorzolamide (TRUSOPT) 2 % ophthalmic solution Place 1 drop into the right eye in the morning and at bedtime.     guaiFENesin-codeine 100-10 MG/5ML syrup Take 5 mLs by mouth 3 (three) times daily as needed for cough. 120 mL 0   lisinopril (ZESTRIL) 5 MG tablet TAKE 1 TABLET BY MOUTH EVERY DAY 90 tablet 3   nitroGLYCERIN (NITROSTAT) 0.4 MG SL tablet 1 TAB UNDER TONGUE EVERY 5 MINUTES X3 DOSES AS NEED CHEST PAIN. IF NO RELIEF AFTER 1ST DOSE GO TO ER 25 tablet 3   sildenafil (REVATIO) 20 MG tablet TAKE 2 TO  3 TABLETS BY MOUTH AS NEEDED 30 MINUTES BEFORE INTERCOURSE (Patient taking differently: Take 40-60 mg by mouth daily as needed (erectile dysfunction.).) 90 tablet 1   sodium chloride (OCEAN) 0.65 % nasal spray Place 1 spray into the nose as needed for congestion. 30 mL 0   warfarin (JANTOVEN) 5 MG tablet Take 1 tablet (5 mg total) by mouth daily. 90 tablet 0   No current facility-administered medications for this visit.     Past Surgical History:  Procedure Laterality Date   BACK SURGERY  2009   DISCECTOMY   CERVICAL FUSION  2008   COLONOSCOPY  2005   Dr. Madilyn Fireman: normal   COLONOSCOPY  2010   Dr. Jena Gauss, +Polyps but no path available. Surveillance 5 years   COLONOSCOPY  2016   Dr. Madilyn Fireman   COLONOSCOPY WITH PROPOFOL N/A 01/15/2020   Procedure: COLONOSCOPY WITH PROPOFOL;  Surgeon: Corbin Ade, MD;  [facility];   Service: Endoscopy;  Laterality: N/A;  10:15am   CORONARY STENT INTERVENTION N/A 04/09/2017   Procedure: CORONARY STENT INTERVENTION;  Surgeon: Yvonne Kendall, MD;  [facility];  Service: Cardiovascular;  Laterality: N/A;   CORONARY STENT INTERVENTION N/A 04/12/2017   Procedure: CORONARY STENT INTERVENTION;  Surgeon: Yvonne Kendall, MD;  [facility];  Service: Cardiovascular;  Laterality: N/A;   INTRAVASCULAR ULTRASOUND/IVUS N/A 04/12/2017   Procedure: Intravascular Ultrasound/IVUS;  Surgeon: Yvonne Kendall, MD;  [facility];  Service: Cardiovascular;  Laterality: N/A;   LEFT HEART CATH AND CORONARY ANGIOGRAPHY N/A 04/09/2017   Procedure: LEFT HEART CATH AND CORONARY ANGIOGRAPHY;  Surgeon: Yvonne Kendall, MD;  [facility];  Service: Cardiovascular;  Laterality: N/A;   TOTAL HIP ARTHROPLASTY Right 12/22/2013   Procedure: RIGHT TOTAL HIP ARTHROPLASTY ANTERIOR APPROACH;  Surgeon: Shelda Pal, MD;  [facility];  Service: Orthopedics;  Laterality: Right;   TOTAL HIP ARTHROPLASTY Left 04/30/2015   Procedure: LEFT TOTAL HIP ARTHROPLASTY ANTERIOR APPROACH;  Surgeon: Durene Romans, MD;  [facility];  Service: Orthopedics;  Laterality: Left;     Allergies  Allergen Reactions   Lyrica [Pregabalin]     "went out of head"      Family History  Problem Relation Age of Onset   Breast cancer Mother    Hypertension Mother    Hyperlipidemia Mother    Heart attack Mother 58   Stroke Mother    Hypertension Paternal Grandmother    Diabetes Paternal Grandmother    Cirrhosis Sister    Cancer Brother    Malignant hyperthermia Brother    Colon cancer Neg Hx      Social History [name] reports that he has never smoked. He has never used smokeless tobacco. [name] reports no history of alcohol use.   Review of Systems CONSTITUTIONAL: No weight loss, fever, chills, weakness or fatigue.  HEENT: Eyes: No visual  loss, blurred vision, double vision or yellow sclerae.No hearing loss, sneezing, congestion, runny nose or sore throat.  SKIN: No rash or itching.  CARDIOVASCULAR: per hpi RESPIRATORY: No shortness of breath, cough or sputum.  GASTROINTESTINAL: No anorexia, nausea, vomiting or diarrhea. No abdominal pain or blood.  GENITOURINARY: No burning on urination, no polyuria NEUROLOGICAL: No headache, dizziness, syncope, paralysis, ataxia, numbness or tingling in the extremities. No change in bowel or bladder control.  MUSCULOSKELETAL: No muscle, back pain, joint pain or stiffness.  LYMPHATICS: No enlarged nodes. No history of splenectomy.  PSYCHIATRIC: No history  of depression or anxiety.  ENDOCRINOLOGIC: No reports of sweating, cold or heat intolerance. No polyuria or polydipsia.  Marland Kitchen   Physical Examination Vitals:   03/31/21 1550  BP: (!) 144/80  Temp: (!) 87 F (30.6 C)  SpO2: 98%   Filed Weights   03/31/21 1550  Weight: 173 lb 12.8 oz (78.8 kg)    Gen: resting comfortably, no acute distress HEENT: no scleral icterus, pupils equal round and reactive, no palptable cervical adenopathy,  CV: RRR, no m/r/g no jvd Resp: Clear to auscultation bilaterally GI: abdomen is soft, non-tender, non-distended, normal bowel sounds, no hepatosplenomegaly MSK: extremities are warm, no edema.  Skin: warm, no rash Neuro:  no focal deficits Psych: appropriate affect   Diagnostic Studies Jan 2019 cath Conclusions: Significant two-vessel coronary artery disease with 80-90% mid LAD stenosis and occluded mid LCx. Mild to moderate right coronary artery disease. Proximal and mid portions of the LAD and RCA are ectatic/aneurysmal. Mildly to moderately reduced left ventricular contraction with mid anterolateral hypokinesis. Low normal LVEDP. Successful PCI to mid LCx using Resolute Onyx 2.0 x 2.0 x 22 mm DES (post-dilated to 2.6 mm proximally) with 0% residual stenosis and TIMI-3 flow.    Recommendations: Plan for staged PCI to LAD next week, as renal function allows. ASA and ticagrelor given today; recommend switching ticagrelor to clopidogrel prior to discharge. Continue warfarin, clopidogrel, and aspirin x 1 month, then discontinue aspirin and complete at least 12 months of warfarin and clopidogrel. Aggressive secondary prevention.     Jan 2019 cath staged PCI Conclusions: Aneurysmal LAD with sequential moderate to severe stenosis involving the proximal and mid portions of the vessel. Patent stent in the mid LCx. Successful IVUS-guided PCI to the mid LAD with placement of non-overlapping Resolute Onyx 3.5 x 8 mm (proximal) and Resolute Onyx 2.75 x 38 mm (distal) drug-eluting stents with 0% residual stenosis and TIMI-3 flow.   Recommendations: Transition from ticagrelor to clopidogrel; will load with clopidogrel 300 mg x 1 tomorrow morning, followed by 75 mg daily thereafter. Resume warfarin per pharmacy tonight. Patient and his family report that he has been bridged in the past for procedures requiring cessation of warfarin. If there is no evidence of bleeding or vascular injury tomorrow, Lovenox bridge should be started tomorrow morning per pharmacy. Continue aspirin, clopidogrel, and warfarin x 1 month. If INR therapeutic/stable, aspirin can be discontinued at that time. Aggressive secondary prevention.   Jan 2019 echo Study Conclusions   - Left ventricle: The cavity size was normal. Wall thickness was   normal. Systolic function was normal. The estimated ejection   fraction was in the range of 60% to 65%. Wall motion was normal;   there were no regional wall motion abnormalities. Doppler   parameters are consistent with abnormal left ventricular   relaxation (grade 1 diastolic dysfunction). The E/e&' ratio is   between 8-15, suggesting indeterminate LV filling pressure. - Aortic valve: Trileaflet. Sclerosis without stenosis. There was   trivial regurgitation. -  Mitral valve: Mildly thickened leaflets . There was trivial   regurgitation. - Left atrium: The atrium was mildly dilated. - Tricuspid valve: There was trivial regurgitation. - Pulmonary arteries: PA peak pressure: 18 mm Hg (S). - Inferior vena cava: The vessel was normal in size. The   respirophasic diameter changes were in the normal range (>= 50%),   consistent with normal central venous pressure.   Impressions:   - LVEF 60-65%, normal wall thickness, normal wall motion, grade 1   DD,  indeterminate LV filling pressure, aortic valve sclerosis   with trivial AI, trivial MR, mild LAE, trivial TR, RVSP 18 mmHg,   normal IVC.      Assessment and Plan  1. CAD -  diarrhea on beta blockers and statins -no recent symptoms, continue current meds  2. Hyperlipidemia - did not tolerate statins due to diarrhea. Reports diarrhea on zetia as well - will refer to lipid clinic to discuss [id number]'s.    3. HTN - above goal, increase lisinopril to 10mg  daily.    , M.D.

## 2021-03-31 NOTE — Patient Instructions (Signed)
Medication Instructions:  Increase Lisinopril to 10mg  daily  Continue all current medications.  Labwork: none  Testing/Procedures: none  Follow-Up: 6 months   Any Other Special Instructions Will Be Listed Below (If Applicable). You have been referred to:  Lipid Clinic for hyperlipidemia.   If you need a refill on your cardiac medications before your next appointment, please call your pharmacy.

## 2021-04-08 ENCOUNTER — Other Ambulatory Visit: Payer: Self-pay | Admitting: Internal Medicine

## 2021-04-23 ENCOUNTER — Other Ambulatory Visit: Payer: Self-pay | Admitting: Internal Medicine

## 2021-04-23 DIAGNOSIS — Z7901 Long term (current) use of anticoagulants: Secondary | ICD-10-CM | POA: Diagnosis not present

## 2021-04-23 DIAGNOSIS — Z86711 Personal history of pulmonary embolism: Secondary | ICD-10-CM | POA: Diagnosis not present

## 2021-04-24 LAB — PROTIME-INR
INR: 2.4 — ABNORMAL HIGH (ref 0.9–1.2)
Prothrombin Time: 24.3 s — ABNORMAL HIGH (ref 9.1–12.0)

## 2021-05-01 ENCOUNTER — Ambulatory Visit: Payer: BC Managed Care – PPO | Admitting: Pharmacist Clinician (PhC)/ Clinical Pharmacy Specialist

## 2021-05-01 ENCOUNTER — Other Ambulatory Visit: Payer: Self-pay

## 2021-05-01 ENCOUNTER — Encounter: Payer: Self-pay | Admitting: Pharmacist Clinician (PhC)/ Clinical Pharmacy Specialist

## 2021-05-01 DIAGNOSIS — E785 Hyperlipidemia, unspecified: Secondary | ICD-10-CM

## 2021-05-01 NOTE — Progress Notes (Signed)
05/03/2021 [name]. [birth year] [id number]   HPI:  [name]. is a 76 y.o. male patient of Dr Carlyle Dolly, who presents today for a lipid clinic evaluation.  See pertinent past medical history below.  Patient has history of intolerance to medications, due to GI issues and has stopped multiple medications due to diarrhea.  Tried 2 different statin drugs in the past, both caused myalgias in addition to diarrhea.    Past Medical History: CAD 03/2017 NSTEMI, staged PCI LCX and LAD - no antiplatelet meds at this time  PE 7/15 chronic L femoral DVT; 4/19 new L popliteal DVT - on lifelong anticoagulation (warfarin), hx of bilateral PE   Current Medications: none  Cholesterol Goals: < 55   Intolerant/previously tried:  pravastatin, atorvastatin - myalgias and diarrhea; ezetimibe - diarrhea  Family history: father died whtrn pt 38; mother had multiple storkes  finally died at late 18's; sister dececased, brother living; 2 kids heathy  Diet: mix of home/eat out - does eat lunch out most days days - usually sandwich shops, doesn't eat much supper  Exercise:  no regular exercise, works in his cabinet/wood shop most days  Labs: 12/22: TC 169, TG 145, HDL 31, LDL 112   Current Outpatient Medications  Medication Sig Dispense Refill   acetaminophen (TYLENOL) 500 MG tablet Take 2 tablets (1,000 mg total) by mouth every 8 (eight) hours. (Patient taking differently: Take 1,000 mg by mouth every 8 (eight) hours as needed (pain/headaches.).) 30 tablet 0   dorzolamide (TRUSOPT) 2 % ophthalmic solution Place 1 drop into the right eye in the morning and at bedtime.     guaiFENesin-codeine 100-10 MG/5ML syrup Take 5 mLs by mouth 3 (three) times daily as needed for cough. 120 mL 0   JANTOVEN 5 MG tablet TAKE 1 TABLET DAILY 90 tablet 0   lisinopril (ZESTRIL) 10 MG tablet Take 1 tablet (10 mg total) by mouth daily. 90 tablet 3   nitroGLYCERIN (NITROSTAT) 0.4 MG SL tablet 1 TAB UNDER TONGUE  EVERY 5 MINUTES X3 DOSES AS NEED CHEST PAIN. IF NO RELIEF AFTER 1ST DOSE GO TO ER 25 tablet 3   sildenafil (REVATIO) 20 MG tablet TAKE 2 TO 3 TABLETS BY MOUTH AS NEEDED 30 MINUTES BEFORE INTERCOURSE (Patient taking differently: Take 40-60 mg by mouth daily as needed (erectile dysfunction.).) 90 tablet 1   sodium chloride (OCEAN) 0.65 % nasal spray Place 1 spray into the nose as needed for congestion. (Patient not taking: Reported on 05/01/2021) 30 mL 0   No current facility-administered medications for this visit.    Allergies  Allergen Reactions   Atorvastatin     myalgias   Lyrica [Pregabalin]     "went out of head"   Pravastatin     myalgias    Past Medical History:  Diagnosis Date   Anticoagulated on Coumadin    Avascular necrosis of hip ([facility])    LEFT   DVT (deep venous thrombosis) ([facility]) 2009   Dyslipidemia, goal LDL below 70 04/09/2017   Family history of anesthesia complication    BROTHER HAD MALIGNANT HYPOTHERMIA - PT HAS NOT HAD ANY PROBLEMS WITH ANESTHESIA     History of kidney stones    Malignant hyperthermia    PT'S BROTHER HAS HX MALIGNANT HYPERTHERMIA   NSTEMI (non-ST elevated myocardial infarction) ([facility]) 04/09/2017   Pulmonary embolism ([facility]) 2009   Vertigo    NO RECENT PROBLEMS    Blood pressure 132/82, pulse 73, resp. rate  16, height 5\' 8"  (1.727 m), weight 172 lb 12.8 oz (78.4 kg), SpO2 98 %.   Dyslipidemia, goal LDL below 70 Patient with history of ASCVD and intolerance to two statin drugs, currently not at LDL goal.  Reviewed options for lowering LDL cholesterol, including PCSK-9 inhibitors, bempedoic acid and inclisiran.  Discussed mechanisms of action, dosing, side effects and potential decreases in LDL cholesterol.  Also reviewed cost information and potential options for patient assistance.  Answered all patient questions.  Patient hesitant to try new medications due to ongoing concerns for diarrhea associated with medication use.  He was given sample of  Repatha to try, as it does not affect GI tract in any way, he may be able to tolerate without issue.  He will try sample at home and call in 1-2 weeks to let me know if he can tolerate this and would like to continue.     Tommy Medal PharmD CPP [facility] [address] [place], [place] [address] [phone]

## 2021-05-01 NOTE — Patient Instructions (Addendum)
Your Results:             Your most recent labs Goal  Total Cholesterol 169 < 200  Triglycerides 145 < 150  HDL (happy/good cholesterol) 31 > 40  LDL (lousy/bad cholesterol 112 < 70      Medication changes:  Try the sample of Repatha and let me know if you can tolerate it.  Call me Erasmo Downer at [phone]) once you decide if this will work and we will start the process to get Repatha covered.  Once approved you will need to do one injection every 14 days and repeat labs after 4-6 doses.      Thank you for choosing [facility]

## 2021-05-03 ENCOUNTER — Encounter: Payer: Self-pay | Admitting: Pharmacist Clinician (PhC)/ Clinical Pharmacy Specialist

## 2021-05-03 NOTE — Assessment & Plan Note (Signed)
Patient with history of ASCVD and intolerance to two statin drugs, currently not at LDL goal.  Reviewed options for lowering LDL cholesterol, including PCSK-9 inhibitors, bempedoic acid and inclisiran.  Discussed mechanisms of action, dosing, side effects and potential decreases in LDL cholesterol.  Also reviewed cost information and potential options for patient assistance.  Answered all patient questions.  Patient hesitant to try new medications due to ongoing concerns for diarrhea associated with medication use.  He was given sample of Repatha to try, as it does not affect GI tract in any way, he may be able to tolerate without issue.  He will try sample at home and call in 1-2 weeks to let me know if he can tolerate this and would like to continue.

## 2021-05-13 ENCOUNTER — Telehealth: Payer: Self-pay

## 2021-05-13 DIAGNOSIS — E785 Hyperlipidemia, unspecified: Secondary | ICD-10-CM

## 2021-05-13 NOTE — Telephone Encounter (Signed)
Spoke to patient, he states no issues with first sample of Repatha.  Willing to give it a try.  Will have Haleigh start the process to get Repatha Sureclick [id number] mg [id number] with labs after 4-6 doses.

## 2021-05-13 NOTE — Telephone Encounter (Signed)
Returning dr. Wallene Huh call I will route to her.

## 2021-05-13 NOTE — Telephone Encounter (Signed)
PA FOR REPATHA SURECLICK 140MG  Q2W SUBMITTED ON CMM: [name] (Key: [id number])  LIPID/HEPATIC PANEL ORDERED/RELEASED

## 2021-05-19 MED ORDER — REPATHA SURECLICK 140 MG/ML ~~LOC~~ SOAJ
140.0000 mg | SUBCUTANEOUS | 11 refills | Status: DC
Start: 2021-05-19 — End: 2021-09-12

## 2021-05-19 NOTE — Addendum Note (Signed)
Addended by: Eather Colas on: 05/19/2021 10:31 AM   Modules accepted: Orders

## 2021-05-19 NOTE — Telephone Encounter (Signed)
Called and spoke w/pt and stated that they were approved for repatha, rx sent, instructed pt to complete fasting post 4th dose labs and to call back if cost prohibitive and the pt voiced understanding

## 2021-06-09 ENCOUNTER — Other Ambulatory Visit: Payer: Self-pay | Admitting: *Deleted

## 2021-06-09 DIAGNOSIS — Z7901 Long term (current) use of anticoagulants: Secondary | ICD-10-CM | POA: Diagnosis not present

## 2021-06-10 LAB — PROTIME-INR
INR: 3 — ABNORMAL HIGH (ref 0.9–1.2)
Prothrombin Time: 29.4 s — ABNORMAL HIGH (ref 9.1–12.0)

## 2021-06-26 DIAGNOSIS — H401212 Low-tension glaucoma, right eye, moderate stage: Secondary | ICD-10-CM | POA: Diagnosis not present

## 2021-07-08 ENCOUNTER — Other Ambulatory Visit: Payer: Self-pay | Admitting: Internal Medicine

## 2021-07-15 DIAGNOSIS — E785 Hyperlipidemia, unspecified: Secondary | ICD-10-CM | POA: Diagnosis not present

## 2021-07-15 LAB — LIPID PANEL
Chol/HDL Ratio: 2.2 ratio (ref 0.0–5.0)
Cholesterol, Total: 85 mg/dL — ABNORMAL LOW (ref 100–199)
HDL: 39 mg/dL — ABNORMAL LOW (ref >39)
LDL Chol Calc (NIH): 27 mg/dL (ref 0–99)
Triglycerides: 98 mg/dL (ref 0–149)
VLDL Cholesterol Cal: 19 mg/dL (ref 5–40)

## 2021-08-07 ENCOUNTER — Other Ambulatory Visit: Payer: Self-pay | Admitting: *Deleted

## 2021-08-07 DIAGNOSIS — Z7901 Long term (current) use of anticoagulants: Secondary | ICD-10-CM | POA: Diagnosis not present

## 2021-08-08 LAB — PROTIME-INR
INR: 3.1 — ABNORMAL HIGH (ref 0.9–1.2)
Prothrombin Time: 30.9 s — ABNORMAL HIGH (ref 9.1–12.0)

## 2021-09-08 ENCOUNTER — Ambulatory Visit: Payer: BC Managed Care – PPO | Admitting: Internal Medicine

## 2021-09-12 ENCOUNTER — Ambulatory Visit: Payer: BC Managed Care – PPO | Admitting: Internal Medicine

## 2021-09-12 ENCOUNTER — Encounter: Payer: Self-pay | Admitting: Internal Medicine

## 2021-09-12 VITALS — BP 124/76 | HR 63 | Resp 18 | Ht 67.5 in | Wt 170.8 lb

## 2021-09-12 DIAGNOSIS — Z7901 Long term (current) use of anticoagulants: Secondary | ICD-10-CM | POA: Diagnosis not present

## 2021-09-12 DIAGNOSIS — I10 Essential (primary) hypertension: Secondary | ICD-10-CM | POA: Diagnosis not present

## 2021-09-12 DIAGNOSIS — Z86711 Personal history of pulmonary embolism: Secondary | ICD-10-CM

## 2021-09-12 DIAGNOSIS — E785 Hyperlipidemia, unspecified: Secondary | ICD-10-CM | POA: Diagnosis not present

## 2021-09-12 DIAGNOSIS — Z9861 Coronary angioplasty status: Secondary | ICD-10-CM

## 2021-09-12 DIAGNOSIS — Z23 Encounter for immunization: Secondary | ICD-10-CM | POA: Diagnosis not present

## 2021-09-12 DIAGNOSIS — I2510 Atherosclerotic heart disease of native coronary artery without angina pectoris: Secondary | ICD-10-CM | POA: Diagnosis not present

## 2021-09-13 LAB — CBC
Hematocrit: 39.6 % (ref 37.5–51.0)
Hemoglobin: 13.5 g/dL (ref 13.0–17.7)
MCH: 29.3 pg (ref 26.6–33.0)
MCHC: 34.1 g/dL (ref 31.5–35.7)
MCV: 86 fL (ref 79–97)
Platelets: 209 10*3/uL (ref 150–450)
RBC: 4.61 x10E6/uL (ref 4.14–5.80)
RDW: 12.6 % (ref 11.6–15.4)
WBC: 4.2 10*3/uL (ref 3.4–10.8)

## 2021-09-13 LAB — BASIC METABOLIC PANEL
BUN/Creatinine Ratio: 15 (ref 10–24)
BUN: 17 mg/dL (ref 8–27)
CO2: 23 mmol/L (ref 20–29)
Calcium: 8.9 mg/dL (ref 8.6–10.2)
Chloride: 102 mmol/L (ref 96–106)
Creatinine, Ser: 1.11 mg/dL (ref 0.76–1.27)
Glucose: 81 mg/dL (ref 70–99)
Potassium: 4.2 mmol/L (ref 3.5–5.2)
Sodium: 137 mmol/L (ref 134–144)
eGFR: 70 mL/min/{1.73_m2} (ref >59)

## 2021-09-13 LAB — PROTIME-INR
INR: 3.1 — ABNORMAL HIGH (ref 0.9–1.2)
Prothrombin Time: 31.2 s — ABNORMAL HIGH (ref 9.1–12.0)

## 2021-09-15 ENCOUNTER — Telehealth: Payer: Self-pay | Admitting: Internal Medicine

## 2021-10-20 ENCOUNTER — Telehealth: Payer: Self-pay | Admitting: *Deleted

## 2021-10-20 DIAGNOSIS — I214 Non-ST elevation (NSTEMI) myocardial infarction: Secondary | ICD-10-CM

## 2021-10-20 DIAGNOSIS — M79645 Pain in left finger(s): Secondary | ICD-10-CM | POA: Diagnosis not present

## 2021-10-20 DIAGNOSIS — M67442 Ganglion, left hand: Secondary | ICD-10-CM | POA: Diagnosis not present

## 2021-10-20 DIAGNOSIS — I2510 Atherosclerotic heart disease of native coronary artery without angina pectoris: Secondary | ICD-10-CM

## 2021-10-20 MED ORDER — EVOLOCUMAB 140 MG/ML ~~LOC~~ SOAJ: 1.0000 mL | SUBCUTANEOUS | 3 refills | Status: DC

## 2021-10-20 NOTE — Telephone Encounter (Signed)
Received fax from [facility] - Repatha 140mg /ml sureclick approved coverage from 10/20/2021 until 10/20/2022.

## 2021-10-20 NOTE — Telephone Encounter (Signed)
Routed to pharmacy team 

## 2021-10-25 IMAGING — DX DG CHEST 2V
2 series · 2 of 2 positions shown · non-contrast
Comparison: April 09, 2017 chest radiograph; chest CT June 21, 2017

CLINICAL DATA: Cough

EXAM:
CHEST - 2 VIEW

[chest pa]
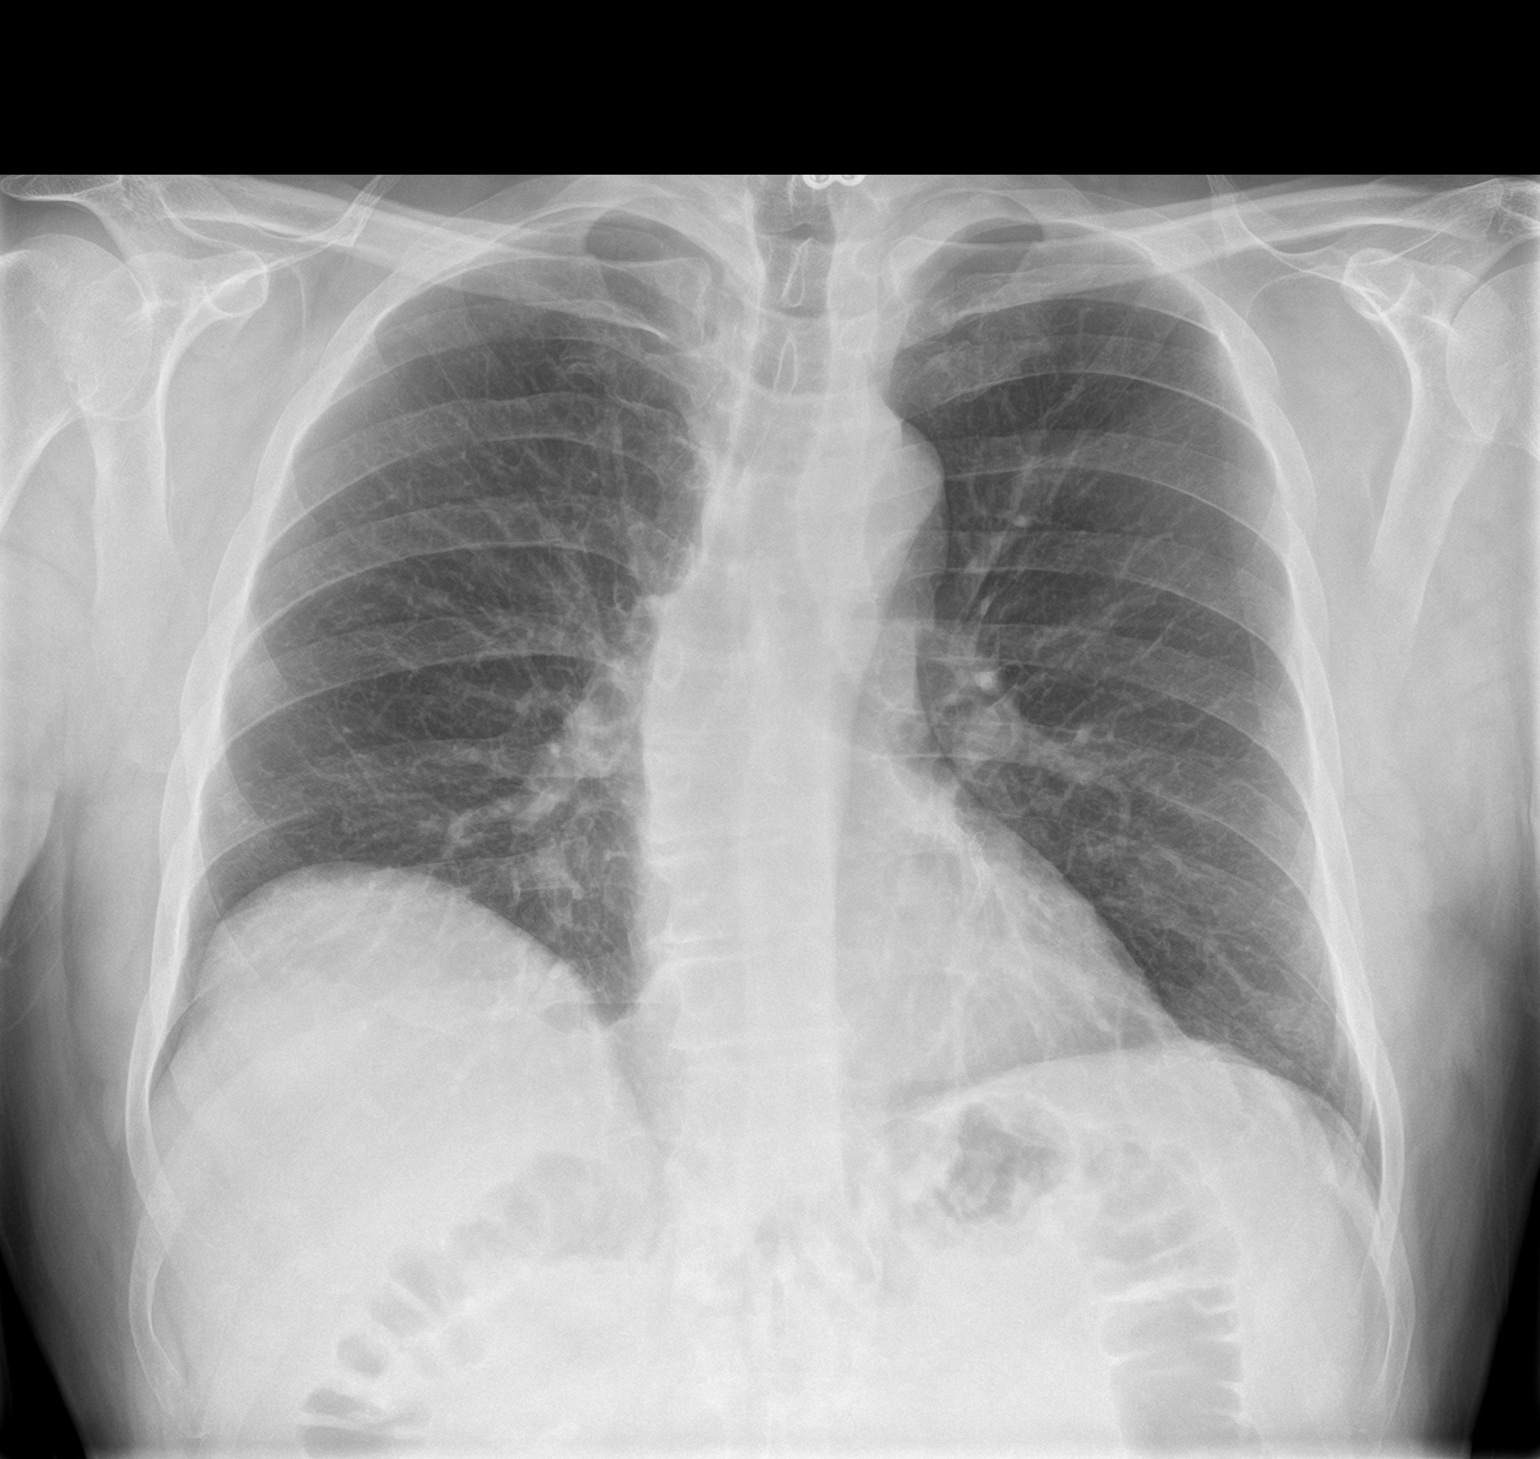

[chest lat]
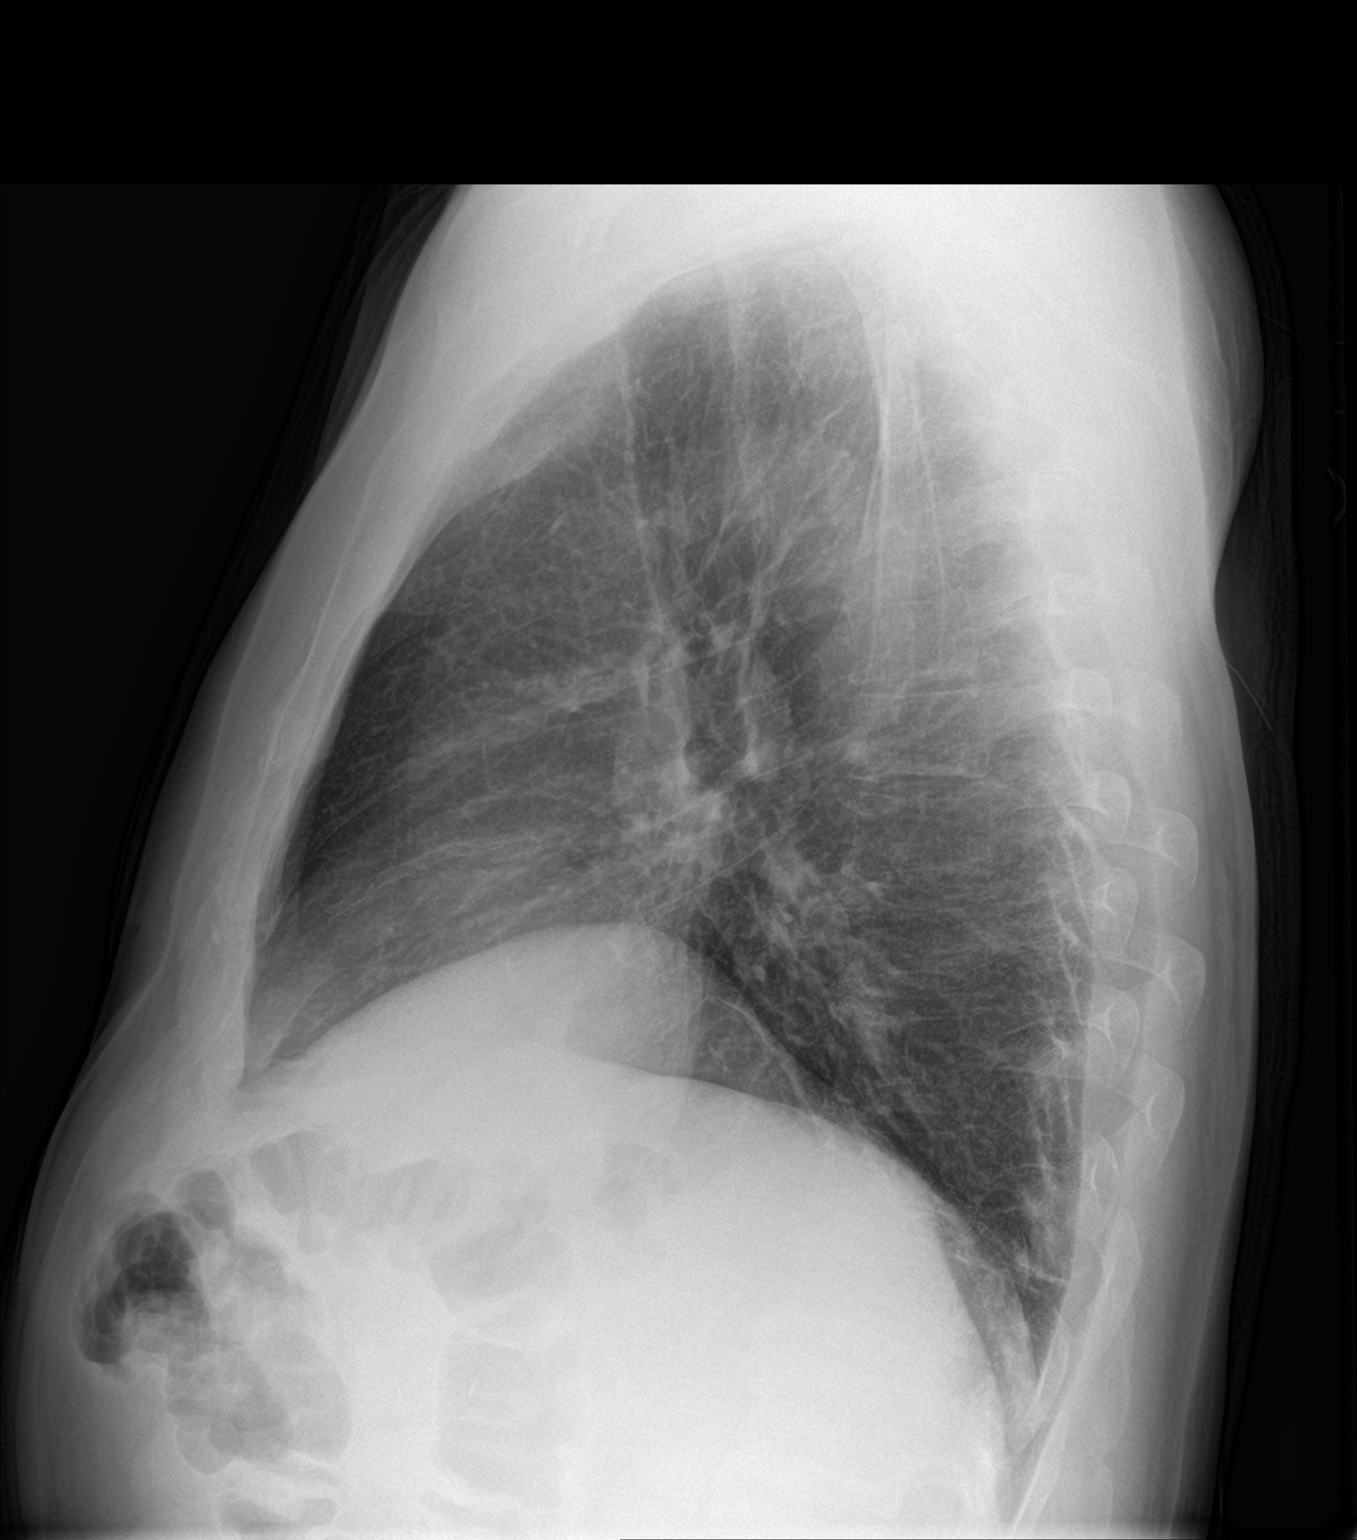

[2 of 2 positions shown; findings below may reference images not displayed]

FINDINGS: Lungs are clear. Heart size and pulmonary vascularity normal. No
adenopathy. There is midthoracic dextroscoliosis. There is
postoperative change in the lower cervical region.
IMPRESSION: No edema or airspace opacity.  Heart size normal.

## 2021-11-13 DIAGNOSIS — M67442 Ganglion, left hand: Secondary | ICD-10-CM | POA: Diagnosis not present

## 2021-11-17 ENCOUNTER — Other Ambulatory Visit: Payer: Self-pay | Admitting: *Deleted

## 2021-11-17 DIAGNOSIS — Z7901 Long term (current) use of anticoagulants: Secondary | ICD-10-CM | POA: Diagnosis not present

## 2021-11-17 DIAGNOSIS — Z86711 Personal history of pulmonary embolism: Secondary | ICD-10-CM

## 2021-11-18 LAB — PROTIME-INR
INR: 2.2 — ABNORMAL HIGH (ref 0.9–1.2)
Prothrombin Time: 22.5 s — ABNORMAL HIGH (ref 9.1–12.0)

## 2022-01-21 DIAGNOSIS — Z7901 Long term (current) use of anticoagulants: Secondary | ICD-10-CM | POA: Diagnosis not present

## 2022-01-27 ENCOUNTER — Telehealth: Payer: Self-pay | Admitting: Internal Medicine

## 2022-01-27 NOTE — Telephone Encounter (Signed)
Pt called for INR test results from the labs drawn last week

## 2022-01-28 NOTE — Telephone Encounter (Signed)
-   Labs sent via fax

## 2022-02-10 ENCOUNTER — Other Ambulatory Visit: Payer: Self-pay | Admitting: Internal Medicine

## 2022-03-19 ENCOUNTER — Ambulatory Visit: Payer: BC Managed Care – PPO | Admitting: Internal Medicine

## 2022-04-07 ENCOUNTER — Other Ambulatory Visit: Payer: Self-pay | Admitting: Internal Medicine

## 2022-04-07 DIAGNOSIS — Z86711 Personal history of pulmonary embolism: Secondary | ICD-10-CM | POA: Diagnosis not present

## 2022-04-07 DIAGNOSIS — Z7901 Long term (current) use of anticoagulants: Secondary | ICD-10-CM | POA: Diagnosis not present

## 2022-04-08 LAB — PROTIME-INR
INR: 2.3 — ABNORMAL HIGH (ref 0.9–1.2)
Prothrombin Time: 23.9 s — ABNORMAL HIGH (ref 9.1–12.0)

## 2022-04-15 ENCOUNTER — Ambulatory Visit (INDEPENDENT_AMBULATORY_CARE_PROVIDER_SITE_OTHER): Payer: BC Managed Care – PPO | Admitting: Internal Medicine

## 2022-04-15 ENCOUNTER — Encounter: Payer: Self-pay | Admitting: Internal Medicine

## 2022-04-15 VITALS — BP 136/80 | HR 68 | Ht 67.5 in | Wt 175.6 lb

## 2022-04-15 DIAGNOSIS — B354 Tinea corporis: Secondary | ICD-10-CM | POA: Insufficient documentation

## 2022-04-15 DIAGNOSIS — E785 Hyperlipidemia, unspecified: Secondary | ICD-10-CM

## 2022-04-15 DIAGNOSIS — I2510 Atherosclerotic heart disease of native coronary artery without angina pectoris: Secondary | ICD-10-CM

## 2022-04-15 DIAGNOSIS — R251 Tremor, unspecified: Secondary | ICD-10-CM | POA: Insufficient documentation

## 2022-04-15 DIAGNOSIS — Z0001 Encounter for general adult medical examination with abnormal findings: Secondary | ICD-10-CM | POA: Diagnosis not present

## 2022-04-15 DIAGNOSIS — I10 Essential (primary) hypertension: Secondary | ICD-10-CM

## 2022-04-15 DIAGNOSIS — R7303 Prediabetes: Secondary | ICD-10-CM

## 2022-04-15 DIAGNOSIS — Z9861 Coronary angioplasty status: Secondary | ICD-10-CM

## 2022-04-15 DIAGNOSIS — Z125 Encounter for screening for malignant neoplasm of prostate: Secondary | ICD-10-CM | POA: Insufficient documentation

## 2022-04-15 DIAGNOSIS — Z23 Encounter for immunization: Secondary | ICD-10-CM | POA: Diagnosis not present

## 2022-04-15 DIAGNOSIS — E559 Vitamin D deficiency, unspecified: Secondary | ICD-10-CM

## 2022-04-15 DIAGNOSIS — Z1159 Encounter for screening for other viral diseases: Secondary | ICD-10-CM

## 2022-04-15 DIAGNOSIS — Z7901 Long term (current) use of anticoagulants: Secondary | ICD-10-CM

## 2022-04-15 MED ORDER — CLOTRIMAZOLE-BETAMETHASONE 1-0.05 % EX CREA: 1.0000 | TOPICAL_CREAM | Freq: Every day | CUTANEOUS | 0 refills | Status: DC

## 2022-04-15 NOTE — Assessment & Plan Note (Signed)
Rash on left leg likely tinea Started Lotrisone cream

## 2022-04-15 NOTE — Assessment & Plan Note (Signed)
BP Readings from Last 1 Encounters:  04/15/22 136/80   Well-controlled with Lisinopril 10 mg QD Counseled for compliance with the medications Advised DASH diet and moderate exercise/walking, at least 150 mins/week

## 2022-04-15 NOTE — Assessment & Plan Note (Signed)
Has had severe GI discomfort and myalgias with statin in the past Was on Zetia for HLD, but reported diarrhea Did not tolerate Repatha Has been evaluated by lipid clinic

## 2022-04-15 NOTE — Assessment & Plan Note (Signed)
Has resting tremors as well, but worse with activity (?) Does not have any other parkinsonian features Referred to Neurology

## 2022-04-15 NOTE — Assessment & Plan Note (Signed)
Takes Coumadin for DVT/PE Check INR

## 2022-04-15 NOTE — Progress Notes (Signed)
Established Patient Office Visit  Subjective:  Patient ID: [name]., male    DOB: 11-05-1945  Age: 77 y.o. MRN: [id number]  CC:  Chief Complaint  Patient presents with   Annual Exam    Patient has a spot left ankle that itches    HPI [name]. is a 77 y.o. male with past medical history of CAD s/p stent placement, DVT/PE in 2009 -on Coumadin, erectile dysfunction, avascular necrosis of hip s/p THA, HLD and colonic polyps who presents for annual physical.  CAD and HTN: BP is well-controlled. Takes medications regularly. He takes Lisinopril 10 mg QD, followed by his Cardiologist. Patient denies headache, dizziness, chest pain, dyspnea or palpitations.  He had DVT/PE in 2009, and has been taking Coumadin since then. He still wants to continue taking Coumadin for now.  He does have easy bruising.  Denies any active bleeding currently.  He gets INR checked every month.  He reports an itching rash on the left leg near ankle area, which is chronic and flares up at times.  Denies any recent insect bite or injury.  He also reports chronic tremors, which are worse with activity.  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  He has tremors in resting state at times as well.  Denies any gait change recently.  Denies taking caffeinated drinks.   Past Medical History:  Diagnosis Date   Anticoagulated on Coumadin    Avascular necrosis of hip ([facility])    LEFT   DVT (deep venous thrombosis) ([facility]) 2009   Dyslipidemia, goal LDL below 70 04/09/2017   Family history of anesthesia complication    BROTHER HAD MALIGNANT HYPOTHERMIA - PT HAS NOT HAD ANY PROBLEMS WITH ANESTHESIA     History of kidney stones    Malignant hyperthermia    PT'S BROTHER HAS HX MALIGNANT HYPERTHERMIA   NSTEMI (non-ST elevated myocardial infarction) ([facility]) 04/09/2017   Pulmonary embolism ([facility]) 2009   Vertigo    NO RECENT PROBLEMS    Past Surgical History:   Procedure Laterality Date   BACK SURGERY  2009   DISCECTOMY   CERVICAL FUSION  2008   COLONOSCOPY  2005   Dr. Madilyn Fireman: normal   COLONOSCOPY  2010   Dr. Jena Gauss, +Polyps but no path available. Surveillance 5 years   COLONOSCOPY  2016   Dr. Madilyn Fireman   COLONOSCOPY WITH PROPOFOL N/A 01/15/2020   Procedure: COLONOSCOPY WITH PROPOFOL;  Surgeon: Corbin Ade, MD;  Location: [facility];  Service: Endoscopy;  Laterality: N/A;  10:15am   CORONARY STENT INTERVENTION N/A 04/09/2017   Procedure: CORONARY STENT INTERVENTION;  Surgeon: Yvonne Kendall, MD;  Location: [facility];  Service: Cardiovascular;  Laterality: N/A;   CORONARY STENT INTERVENTION N/A 04/12/2017   Procedure: CORONARY STENT INTERVENTION;  Surgeon: Yvonne Kendall, MD;  Location: [facility];  Service: Cardiovascular;  Laterality: N/A;   INTRAVASCULAR ULTRASOUND/IVUS N/A 04/12/2017   Procedure: Intravascular Ultrasound/IVUS;  Surgeon: Yvonne Kendall, MD;  Location: [facility];  Service: Cardiovascular;  Laterality: N/A;   LEFT HEART CATH AND CORONARY ANGIOGRAPHY N/A 04/09/2017   Procedure: LEFT HEART CATH AND CORONARY ANGIOGRAPHY;  Surgeon: Yvonne Kendall, MD;  Location: [facility];  Service: Cardiovascular;  Laterality: N/A;   TOTAL HIP ARTHROPLASTY Right 12/22/2013   Procedure: RIGHT TOTAL HIP ARTHROPLASTY ANTERIOR APPROACH;  Surgeon: Shelda Pal, MD;  Location: [facility];  Service: Orthopedics;  Laterality: Right;   TOTAL HIP ARTHROPLASTY Left 04/30/2015   Procedure: LEFT TOTAL HIP ARTHROPLASTY ANTERIOR APPROACH;  Surgeon: Paralee Cancel, MD;  Location: [facility];  Service: Orthopedics;  Laterality: Left;    Family History  Problem Relation Age of Onset   Breast cancer Mother    Hypertension Mother    Hyperlipidemia Mother    Heart attack Mother 56   Stroke Mother    Hypertension Paternal Grandmother    Diabetes Paternal Grandmother    Cirrhosis Sister    Cancer Brother    Malignant hyperthermia  Brother    Colon cancer Neg Hx     Social History   Socioeconomic History   Marital status: Married    Spouse name: Not on file   Number of children: Not on file   Years of education: Not on file   Highest education level: Not on file  Occupational History   Occupation: self employed, Forensic scientist  Tobacco Use   Smoking status: Never   Smokeless tobacco: Never  Vaping Use   Vaping Use: Never used  Substance and Sexual Activity   Alcohol use: No   Drug use: No   Sexual activity: Not on file  Other Topics Concern   Not on file  Social History Narrative   Not on file   Social Determinants of Health   Financial Resource Strain: Not on file  Food Insecurity: Not on file  Transportation Needs: Not on file  Physical Activity: Not on file  Stress: Not on file  Social Connections: Not on file  Intimate Partner Violence: Not on file    Outpatient Medications Prior to Visit  Medication Sig Dispense Refill   acetaminophen (TYLENOL) 500 MG tablet Take 2 tablets (1,000 mg total) by mouth every 8 (eight) hours. (Patient taking differently: Take 1,000 mg by mouth every 8 (eight) hours as needed (pain/headaches.).) 30 tablet 0   dorzolamide (TRUSOPT) 2 % ophthalmic solution Place 1 drop into the right eye in the morning and at bedtime.     Evolocumab 140 MG/ML SOAJ Inject 1 mL into the skin every 14 (fourteen) days. 6 mL 3   JANTOVEN 5 MG tablet TAKE 1 TABLET DAILY 90 tablet 1   lisinopril (ZESTRIL) 10 MG tablet Take 1 tablet (10 mg total) by mouth daily. 90 tablet 3   nitroGLYCERIN (NITROSTAT) 0.4 MG SL tablet 1 TAB UNDER TONGUE EVERY 5 MINUTES X3 DOSES AS NEED CHEST PAIN. IF NO RELIEF AFTER 1ST DOSE GO TO ER 25 tablet 3   sildenafil (REVATIO) 20 MG tablet TAKE 2 TO 3 TABLETS BY MOUTH AS NEEDED 30 MINUTES BEFORE INTERCOURSE (Patient taking differently: Take 40-60 mg by mouth daily as needed (erectile dysfunction.).) 90 tablet 1   No facility-administered medications prior  to visit.    Allergies  Allergen Reactions   Atorvastatin     myalgias   Lyrica [Pregabalin]     "went out of head"   Pravastatin     myalgias    ROS Review of Systems  Constitutional:  Negative for chills and fever.  HENT:  Negative for congestion and sore throat.   Eyes:  Negative for pain and discharge.  Respiratory:  Negative for cough and shortness of breath.   Cardiovascular:  Negative for chest pain and palpitations.  Gastrointestinal:  Negative for constipation, diarrhea, nausea and vomiting.  Endocrine: Negative for polydipsia and polyuria.  Genitourinary:  Negative for dysuria and hematuria.  Musculoskeletal:  Negative for neck  pain and neck stiffness.  Skin:  Positive for rash.  Neurological:  Positive for tremors. Negative for dizziness, weakness, numbness and headaches.  Hematological:  Bruises/bleeds easily.  Psychiatric/Behavioral:  Negative for agitation and behavioral problems.       Objective:    Physical Exam Vitals reviewed.  Constitutional:      General: He is not in acute distress.    Appearance: He is not diaphoretic.  HENT:     Head: Normocephalic and atraumatic.     Nose: No congestion.     Mouth/Throat:     Mouth: Mucous membranes are moist.  Eyes:     General: No scleral icterus.    Extraocular Movements: Extraocular movements intact.  Cardiovascular:     Rate and Rhythm: Normal rate and regular rhythm.     Pulses: Normal pulses.     Heart sounds: Normal heart sounds. No murmur heard. Pulmonary:     Breath sounds: Normal breath sounds. No wheezing or rales.  Abdominal:     Palpations: Abdomen is soft.     Tenderness: There is no abdominal tenderness.  Musculoskeletal:     Cervical back: Neck supple. No tenderness.     Right lower leg: No edema.     Left lower leg: No edema.  Skin:    General: Skin is warm.     Findings: Rash (Erythematous patches over medial aspect of left lower leg, about 2 cm in diameter X 2) present. No  bruising.  Neurological:     General: No focal deficit present.     Mental Status: He is alert and oriented to person, place, and time.     Sensory: No sensory deficit.     Motor: No weakness.  Psychiatric:        Mood and Affect: Mood normal.        Behavior: Behavior normal.     BP 136/80 (BP Location: Left Arm, Patient Position: Sitting, Cuff Size: Normal)   Pulse 68   Ht 5' 7.5" (1.715 m)   Wt 175 lb 9.6 oz (79.7 kg)   SpO2 98%   BMI 27.10 kg/m  Wt Readings from Last 3 Encounters:  04/15/22 175 lb 9.6 oz (79.7 kg)  09/12/21 170 lb 12.8 oz (77.5 kg)  05/01/21 172 lb 12.8 oz (78.4 kg)    Lab Results  Component Value Date   TSH 1.940 03/06/2021   Lab Results  Component Value Date   WBC 4.2 09/12/2021   HGB 13.5 09/12/2021   HCT 39.6 09/12/2021   MCV 86 09/12/2021   PLT 209 09/12/2021   Lab Results  Component Value Date   NA 137 09/12/2021   K 4.2 09/12/2021   CO2 23 09/12/2021   GLUCOSE 81 09/12/2021   BUN 17 09/12/2021   CREATININE 1.11 09/12/2021   BILITOT 0.5 03/06/2021   ALKPHOS 69 03/06/2021   AST 22 03/06/2021   ALT 23 03/06/2021   PROT 6.8 03/06/2021   ALBUMIN 4.4 03/06/2021   CALCIUM 8.9 09/12/2021   ANIONGAP 10 04/13/2017   EGFR 70 09/12/2021   Lab Results  Component Value Date   CHOL 85 (L) 07/15/2021   Lab Results  Component Value Date   HDL 39 (L) 07/15/2021   Lab Results  Component Value Date   LDLCALC 27 07/15/2021   Lab Results  Component Value Date   TRIG 98 07/15/2021   Lab Results  Component Value Date   CHOLHDL 2.2 07/15/2021   Lab Results  Component Value  Date   HGBA1C 5.9 (H) 03/06/2021      Assessment & Plan:   Problem List Items Addressed This Visit       Cardiovascular and Mediastinum   CAD S/P percutaneous coronary angioplasty    On Coumadin due to history of DVT and PE Did not tolerate statin in the past Did not tolerate Repatha as well Followed by Cardiology.      Relevant Orders   TSH    CMP14+EGFR   Essential hypertension    BP Readings from Last 1 Encounters:  04/15/22 136/80  Well-controlled with Lisinopril 10 mg QD Counseled for compliance with the medications Advised DASH diet and moderate exercise/walking, at least 150 mins/week      Relevant Orders   TSH   CMP14+EGFR     Musculoskeletal and Integument   Tinea corporis    Rash on left leg likely tinea Started Lotrisone cream      Relevant Medications   clotrimazole-betamethasone (LOTRISONE) cream     Other   Long term (current) use of anticoagulants    Takes Coumadin for DVT/PE Check INR      Relevant Orders   CBC with Differential/Platelet   INR/PT   Dyslipidemia, goal LDL below 70    Has had severe GI discomfort and myalgias with statin in the past Was on Zetia for HLD, but reported diarrhea Did not tolerate Repatha Has been evaluated by lipid clinic      Relevant Orders   Lipid panel   Encounter for general adult medical examination with abnormal findings - Primary    Physical exam as documented. Counseling done  re healthy lifestyle involving commitment to 150 minutes exercise per week, heart healthy diet, and attaining healthy weight.The importance of adequate sleep also discussed. Changes in health habits are decided on by the patient with goals and time frames  set for achieving them. Immunization and cancer screening needs are specifically addressed at this visit.      Relevant Orders   TSH   CMP14+EGFR   CBC with Differential/Platelet   Tremors of nervous system    Has resting tremors as well, but worse with activity (?) Does not have any other parkinsonian features Referred to Neurology      Relevant Orders   Ambulatory referral to Neurology   Prostate cancer screening    Ordered PSA after discussing its limitations for prostate cancer screening, including false positive results leading to additional investigations.      Relevant Orders   PSA   Other Visit Diagnoses      Need for hepatitis C screening test       Relevant Orders   Hepatitis C Antibody   Vitamin D deficiency       Relevant Orders   VITAMIN D 25 Hydroxy (Vit-D Deficiency, Fractures)   Prediabetes       Relevant Orders   Hemoglobin A1c   Need for shingles vaccine       Relevant Orders   Varicella-zoster vaccine IM (Completed)       Meds ordered this encounter  Medications   clotrimazole-betamethasone (LOTRISONE) cream    Sig: Apply 1 Application topically daily.    Dispense:  30 g    Refill:  0    Follow-up: Return in about 6 months (around 10/14/2022) for HTN.    Lindell Spar, MD

## 2022-04-15 NOTE — Assessment & Plan Note (Signed)

## 2022-04-15 NOTE — Patient Instructions (Addendum)
Please continue taking medications as prescribed.  Please continue to follow low salt diet and perform moderate exercise/walking at least 150 mins/week.  Please get fasting blood tests done after 1 week.

## 2022-04-15 NOTE — Assessment & Plan Note (Signed)
On Coumadin due to history of DVT and PE Did not tolerate statin in the past Did not tolerate Repatha as well Followed by Cardiology.

## 2022-04-15 NOTE — Assessment & Plan Note (Signed)
Ordered PSA after discussing its limitations for prostate cancer screening, including false positive results leading to additional investigations. 

## 2022-04-20 ENCOUNTER — Other Ambulatory Visit: Payer: Self-pay | Admitting: Internal Medicine

## 2022-04-21 ENCOUNTER — Encounter: Payer: Self-pay | Admitting: Neurology

## 2022-04-30 DIAGNOSIS — E559 Vitamin D deficiency, unspecified: Secondary | ICD-10-CM | POA: Diagnosis not present

## 2022-04-30 DIAGNOSIS — Z125 Encounter for screening for malignant neoplasm of prostate: Secondary | ICD-10-CM | POA: Diagnosis not present

## 2022-04-30 DIAGNOSIS — Z7901 Long term (current) use of anticoagulants: Secondary | ICD-10-CM | POA: Diagnosis not present

## 2022-04-30 DIAGNOSIS — I2510 Atherosclerotic heart disease of native coronary artery without angina pectoris: Secondary | ICD-10-CM | POA: Diagnosis not present

## 2022-04-30 DIAGNOSIS — Z1159 Encounter for screening for other viral diseases: Secondary | ICD-10-CM | POA: Diagnosis not present

## 2022-04-30 DIAGNOSIS — R7303 Prediabetes: Secondary | ICD-10-CM | POA: Diagnosis not present

## 2022-04-30 DIAGNOSIS — E785 Hyperlipidemia, unspecified: Secondary | ICD-10-CM | POA: Diagnosis not present

## 2022-04-30 DIAGNOSIS — I10 Essential (primary) hypertension: Secondary | ICD-10-CM | POA: Diagnosis not present

## 2022-05-01 LAB — CBC WITH DIFFERENTIAL/PLATELET
Basophils Absolute: 0 10*3/uL (ref 0.0–0.2)
Basos: 1 %
EOS (ABSOLUTE): 0.1 10*3/uL (ref 0.0–0.4)
Eos: 2 %
Hematocrit: 40.6 % (ref 37.5–51.0)
Hemoglobin: 14 g/dL (ref 13.0–17.7)
Immature Grans (Abs): 0 10*3/uL (ref 0.0–0.1)
Immature Granulocytes: 0 %
Lymphocytes Absolute: 1.3 10*3/uL (ref 0.7–3.1)
Lymphs: 27 %
MCH: 30 pg (ref 26.6–33.0)
MCHC: 34.5 g/dL (ref 31.5–35.7)
MCV: 87 fL (ref 79–97)
Monocytes Absolute: 0.4 10*3/uL (ref 0.1–0.9)
Monocytes: 9 %
Neutrophils Absolute: 2.9 10*3/uL (ref 1.4–7.0)
Neutrophils: 61 %
Platelets: 210 10*3/uL (ref 150–450)
RBC: 4.66 x10E6/uL (ref 4.14–5.80)
RDW: 12.9 % (ref 11.6–15.4)
WBC: 4.8 10*3/uL (ref 3.4–10.8)

## 2022-05-01 LAB — CMP14+EGFR
ALT: 19 IU/L (ref 0–44)
AST: 18 IU/L (ref 0–40)
Albumin/Globulin Ratio: 1.7 (ref 1.2–2.2)
Albumin: 4.4 g/dL (ref 3.8–4.8)
Alkaline Phosphatase: 72 IU/L (ref 44–121)
BUN/Creatinine Ratio: 16 (ref 10–24)
BUN: 17 mg/dL (ref 8–27)
Bilirubin Total: 1 mg/dL (ref 0.0–1.2)
CO2: 23 mmol/L (ref 20–29)
Calcium: 9.2 mg/dL (ref 8.6–10.2)
Chloride: 103 mmol/L (ref 96–106)
Creatinine, Ser: 1.06 mg/dL (ref 0.76–1.27)
Globulin, Total: 2.6 g/dL (ref 1.5–4.5)
Glucose: 83 mg/dL (ref 70–99)
Potassium: 4 mmol/L (ref 3.5–5.2)
Sodium: 141 mmol/L (ref 134–144)
Total Protein: 7 g/dL (ref 6.0–8.5)
eGFR: 73 mL/min/{1.73_m2} (ref >59)

## 2022-05-01 LAB — LIPID PANEL
Chol/HDL Ratio: 5.1 ratio — ABNORMAL HIGH (ref 0.0–5.0)
Cholesterol, Total: 192 mg/dL (ref 100–199)
HDL: 38 mg/dL — ABNORMAL LOW (ref >39)
LDL Chol Calc (NIH): 129 mg/dL — ABNORMAL HIGH (ref 0–99)
Triglycerides: 137 mg/dL (ref 0–149)
VLDL Cholesterol Cal: 25 mg/dL (ref 5–40)

## 2022-05-01 LAB — HEMOGLOBIN A1C
Est. average glucose Bld gHb Est-mCnc: 126 mg/dL
Hgb A1c MFr Bld: 6 % — ABNORMAL HIGH (ref 4.8–5.6)

## 2022-05-01 LAB — PROTIME-INR
INR: 3 — ABNORMAL HIGH (ref 0.9–1.2)
Prothrombin Time: 30.2 s — ABNORMAL HIGH (ref 9.1–12.0)

## 2022-05-01 LAB — VITAMIN D 25 HYDROXY (VIT D DEFICIENCY, FRACTURES): Vit D, 25-Hydroxy: 24.5 ng/mL — ABNORMAL LOW (ref 30.0–100.0)

## 2022-05-01 LAB — PSA: Prostate Specific Ag, Serum: 1.7 ng/mL (ref 0.0–4.0)

## 2022-05-01 LAB — HEPATITIS C ANTIBODY: Hep C Virus Ab: NONREACTIVE

## 2022-05-01 LAB — TSH: TSH: 2.35 u[IU]/mL (ref 0.450–4.500)

## 2022-05-14 ENCOUNTER — Ambulatory Visit: Payer: BC Managed Care – PPO | Attending: Cardiology | Admitting: Cardiology

## 2022-05-14 ENCOUNTER — Encounter: Payer: Self-pay | Admitting: Cardiology

## 2022-05-14 VITALS — BP 122/76 | HR 76 | Ht 68.0 in | Wt 171.0 lb

## 2022-05-14 DIAGNOSIS — I2510 Atherosclerotic heart disease of native coronary artery without angina pectoris: Secondary | ICD-10-CM

## 2022-05-14 DIAGNOSIS — I10 Essential (primary) hypertension: Secondary | ICD-10-CM

## 2022-05-14 DIAGNOSIS — E782 Mixed hyperlipidemia: Secondary | ICD-10-CM | POA: Diagnosis not present

## 2022-05-14 MED ORDER — LOSARTAN POTASSIUM 25 MG PO TABS: 25.0000 mg | ORAL_TABLET | Freq: Every day | ORAL | 6 refills | Status: DC

## 2022-05-14 MED ORDER — SILDENAFIL CITRATE 20 MG PO TABS: ORAL_TABLET | ORAL | 1 refills | Status: AC

## 2022-05-14 NOTE — Progress Notes (Signed)
Clinical Summary Mr. Hellums is a 77 y.o.male seen today for follow up of the following medical problems.      1. CAD - admit Jan 2019 with subcascpular pain, found to have NSTEMI peak trop 52 - cath Jan 2019 staged PCI LCX and LAD.  - plans were for triple therapy with asa, plavix, coumdin (for history of PE) x 1 month, then plavix and coumadin - Jan 2019 echo LVEF 60-65%, no wma's, grade I diatolic dysfunction.    - previously came off beta blocker and statin due to diarrhea. He tried taking pravastatin low dose and did so for a few months but had recurrent diarrhea and ended up stopping     - no chest pains, no SOB/DOE - compliant with meds   2. History of bilateral PE -  occurred after immobility after back surgery in 10/2017 - 09/2013 LE US showed chronic DVT left femoral vein - 06/2017 CT PE no PE - 06/2017 LE venous US: left popliteal vein DVT new compared to 09/2013 study. Chronic DVT left femoral and tibial veins. Chronic DVT left deep femoral vein.   - on life long coumadin from notes, notes indicate this was recommended by hematology in the past - seen again by hematology, decision made to continue anticoagulation.           3. Hyperlipidemia - 11/2018 TC 187 TG 110 HDL 42 LDL 125 - intolerant to statins   - last visit 07/2019 was to start zetia 83m daily.  - 12/2019 TC 144 TG 134 HDL 32 LDL 88   - reports zetia caused diarrhea. Stopped taking. - seen in lipid clinic, was to try repatha - diarrhea on repatha, has stopped taking - he is not interested in trying any alternatives at this time.     4. 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      Past Medical History:  Diagnosis Date   Anticoagulated on Coumadin    [place]    [place]   [place]   Dyslipidemia, goal LDL below 70 04/09/2017   Family history of anesthesia complication    BROTHER  HAD MALIGNANT HYPOTHERMIA - PT HAS NOT HAD ANY PROBLEMS WITH ANESTHESIA     History of kidney stones    Malignant hyperthermia    PT'S BROTHER HAS HX MALIGNANT HYPERTHERMIA   [place]non-ST elevated [place] infarction[place] [place]   [place] embolism [place] [place]   Vertigo    NO RECENT PROBLEMS     Allergies  Allergen Reactions   Atorvastatin     myalgias   Lyrica [Pregabalin]     "went out of head"   Pravastatin     myalgias     Current Outpatient Medications  Medication Sig Dispense Refill   acetaminophen (TYLENOL) 500 MG tablet Take 2 tablets (1,000 mg total) by mouth every 8 (eight) hours. (Patient taking differently: Take 1,000 mg by mouth every 8 (eight) hours as needed (pain/headaches.).) 30 tablet 0   clotrimazole-betamethasone (LOTRISONE) cream Apply 1 Application topically daily. 30 g 0   dorzolamide (TRUSOPT) 2 % ophthalmic solution Place 1 drop into the right eye in the morning and at bedtime.     Evolocumab 140 MG/ML SOAJ Inject 1 mL into the skin every 14 (fourteen) days. 6 mL 3   JANTOVEN 5 MG tablet TAKE 1 TABLET  DAILY 90 tablet 1   lisinopril (ZESTRIL) 10 MG tablet Take 1 tablet (10 mg total) by mouth daily. 90 tablet 3   nitroGLYCERIN (NITROSTAT) 0.4 MG SL tablet 1 TAB UNDER TONGUE EVERY 5 MINUTES X3 DOSES AS NEED CHEST PAIN. IF NO RELIEF AFTER 1ST DOSE GO TO ER 25 tablet 3   sildenafil (REVATIO) 20 MG tablet TAKE 2 TO 3 TABLETS BY MOUTH AS NEEDED 30 MINUTES BEFORE INTERCOURSE (Patient taking differently: Take 40-60 mg by mouth daily as needed (erectile dysfunction.).) 90 tablet 1   No current facility-administered medications for this visit.     Past Surgical History:  Procedure Laterality Date   BACK SURGERY  2009   DISCECTOMY   CERVICAL FUSION  2008   COLONOSCOPY  2005   Dr. Amedeo Plenty: normal   COLONOSCOPY  2010   Dr. Gala Romney, +Polyps but no path available. Surveillance 5 years   COLONOSCOPY  2016   Dr. Amedeo Plenty   COLONOSCOPY WITH PROPOFOL N/A  01/15/2020   Procedure: COLONOSCOPY WITH PROPOFOL;  Surgeon: Daneil Dolin, MD;  [facility];  Service: Endoscopy;  Laterality: N/A;  10:15am   CORONARY STENT INTERVENTION N/A 04/09/2017   Procedure: CORONARY STENT INTERVENTION;  Surgeon: Nelva Bush, MD;  Location: [facility];  Service: Cardiovascular;  Laterality: N/A;   CORONARY STENT INTERVENTION N/A 04/12/2017   Procedure: CORONARY STENT INTERVENTION;  Surgeon: Nelva Bush, MD;  Location: [facility];  Service: Cardiovascular;  Laterality: N/A;   INTRAVASCULAR ULTRASOUND/IVUS N/A 04/12/2017   Procedure: Intravascular Ultrasound/IVUS;  Surgeon: Nelva Bush, MD;  Location: [facility];  Service: Cardiovascular;  Laterality: N/A;   LEFT HEART CATH AND CORONARY ANGIOGRAPHY N/A 04/09/2017   Procedure: LEFT HEART CATH AND CORONARY ANGIOGRAPHY;  Surgeon: Nelva Bush, MD;  Location: [facility];  Service: Cardiovascular;  Laterality: N/A;   TOTAL HIP ARTHROPLASTY Right 12/22/2013   Procedure: RIGHT TOTAL HIP ARTHROPLASTY ANTERIOR APPROACH;  Surgeon: Mauri Pole, MD;  Location: [facility];  Service: Orthopedics;  Laterality: Right;   TOTAL HIP ARTHROPLASTY Left 04/30/2015   Procedure: LEFT TOTAL HIP ARTHROPLASTY ANTERIOR APPROACH;  Surgeon: Paralee Cancel, MD;  Location: [facility];  Service: Orthopedics;  Laterality: Left;     Allergies  Allergen Reactions   Atorvastatin     myalgias   Lyrica [Pregabalin]     "went out of head"   Pravastatin     myalgias      Family History  Problem Relation Age of Onset   Breast cancer Mother    Hypertension Mother    Hyperlipidemia Mother    Heart attack Mother 9   Stroke Mother    Hypertension Paternal Grandmother    Diabetes Paternal Grandmother    Cirrhosis Sister    Cancer Brother    Malignant hyperthermia Brother    Colon cancer Neg Hx      Social History Mr. Minette reports that he has never smoked. He has never used smokeless  tobacco. Mr. Passey reports no history of alcohol use.   Review of Systems CONSTITUTIONAL: No weight loss, fever, chills, weakness or fatigue.  HEENT: Eyes: No visual loss, blurred vision, double vision or yellow sclerae.No hearing loss, sneezing, congestion, runny nose or sore throat.  SKIN: No rash or itching.  CARDIOVASCULAR: per hpi RESPIRATORY: No shortness of breath, cough or sputum.  GASTROINTESTINAL: No anorexia, nausea, vomiting or diarrhea. No abdominal pain or blood.  GENITOURINARY: No burning on urination, no polyuria NEUROLOGICAL: No  headache, dizziness, syncope, paralysis, ataxia, numbness or tingling in the extremities. No change in bowel or bladder control.  MUSCULOSKELETAL: No muscle, back pain, joint pain or stiffness.  LYMPHATICS: No enlarged nodes. No history of splenectomy.  PSYCHIATRIC: No history of depression or anxiety.  ENDOCRINOLOGIC: No reports of sweating, cold or heat intolerance. No polyuria or polydipsia.  Marland Kitchen   Physical Examination Today's Vitals   05/14/22 1127  BP: 122/76  Pulse: 76  SpO2: 96%  Weight: 171 lb (77.6 kg)  Height: 5' 8"$  (1.727 m)   Body mass index is 26 kg/m.  Gen: resting comfortably, no acute distress HEENT: no scleral icterus, pupils equal round and reactive, no palptable cervical adenopathy,  CV: RRR, no m/rg, no jvd Resp: Clear to auscultation bilaterally GI: abdomen is soft, non-tender, non-distended, normal bowel sounds, no hepatosplenomegaly MSK: extremities are warm, no edema.  Skin: warm, no rash Neuro:  no focal deficits Psych: appropriate affect   Diagnostic Studies  Jan 2019 cath Conclusions: Significant two-vessel coronary artery disease with 80-90% mid LAD stenosis and occluded mid LCx. Mild to moderate right coronary artery disease. Proximal and mid portions of the LAD and RCA are ectatic/aneurysmal. Mildly to moderately reduced left ventricular contraction with mid anterolateral hypokinesis. Low normal  LVEDP. Successful PCI to mid LCx using Resolute Onyx 2.0 x 2.0 x 22 mm DES (post-dilated to 2.6 mm proximally) with 0% residual stenosis and TIMI-3 flow.   Recommendations: Plan for staged PCI to LAD next week, as renal function allows. ASA and ticagrelor given today; recommend switching ticagrelor to clopidogrel prior to discharge. Continue warfarin, clopidogrel, and aspirin x 1 month, then discontinue aspirin and complete at least 12 months of warfarin and clopidogrel. Aggressive secondary prevention.     Jan 2019 cath staged PCI Conclusions: Aneurysmal LAD with sequential moderate to severe stenosis involving the proximal and mid portions of the vessel. Patent stent in the mid LCx. Successful IVUS-guided PCI to the mid LAD with placement of non-overlapping Resolute Onyx 3.5 x 8 mm (proximal) and Resolute Onyx 2.75 x 38 mm (distal) drug-eluting stents with 0% residual stenosis and TIMI-3 flow.   Recommendations: Transition from ticagrelor to clopidogrel; will load with clopidogrel 300 mg x 1 tomorrow morning, followed by 75 mg daily thereafter. Resume warfarin per pharmacy tonight. Patient and his family report that he has been bridged in the past for procedures requiring cessation of warfarin. If there is no evidence of bleeding or vascular injury tomorrow, Lovenox bridge should be started tomorrow morning per pharmacy. Continue aspirin, clopidogrel, and warfarin x 1 month. If INR therapeutic/stable, aspirin can be discontinued at that time. Aggressive secondary prevention.   Jan 2019 echo Study Conclusions   - Left ventricle: The cavity size was normal. Wall thickness was   normal. Systolic function was normal. The estimated ejection   fraction was in the range of 60% to 65%. Wall motion was normal;   there were no regional wall motion abnormalities. Doppler   parameters are consistent with abnormal left ventricular   relaxation (grade 1 diastolic dysfunction). The E/e&' ratio is    between 8-15, suggesting indeterminate LV filling pressure. - Aortic valve: Trileaflet. Sclerosis without stenosis. There was   trivial regurgitation. - Mitral valve: Mildly thickened leaflets . There was trivial   regurgitation. - Left atrium: The atrium was mildly dilated. - Tricuspid valve: There was trivial regurgitation. - Pulmonary arteries: PA peak pressure: 18 mm Hg (S). - Inferior vena cava: The vessel was normal in size.  The   respirophasic diameter changes were in the normal range (>= 50%),   consistent with normal central venous pressure.   Impressions:   - LVEF 60-65%, normal wall thickness, normal wall motion, grade 1   DD, indeterminate LV filling pressure, aortic valve sclerosis   with trivial AI, trivial MR, mild LAE, trivial TR, RVSP 18 mmHg,   normal IVC.         Assessment and Plan   1. CAD -  diarrhea on beta blockers and statins -reports cough possibly due to ACEi, change to losartan 24m daily - no ASA since on coumadin   2. Hyperlipidemia - did not tolerate statins due to diarrhea. Reports diarrhea on zetia as well - recently on repatha and also developed diarrhea - he is not intersted in considering any other alternative at this time     3. HTN - cough on lisinopril, change to losartan 228mdaily.      JoArnoldo LenisM.D.

## 2022-05-14 NOTE — Patient Instructions (Addendum)
Medication Instructions:  Stop Lisinopril  Begin Losartan 30m daily  Sildenafil refilled today  Continue all other medications.     Labwork: none  Testing/Procedures: none  Follow-Up: 6 months   Any Other Special Instructions Will Be Listed Below (If Applicable).   If you need a refill on your cardiac medications before your next appointment, please call your pharmacy.

## 2022-05-26 NOTE — Progress Notes (Signed)
Assessment/Plan:   Essential Tremor.  -This is evidenced by the symmetrical nature and longstanding hx of gradually getting worse.  We discussed nature and pathophysiology.  We discussed that this can continue to gradually get worse with time.  We discussed that some medications can worsen this, as can caffeine use.  We discussed medication therapy as well as surgical therapy.  Ultimately, the patient is not that bothered by the tremor itself and is really not interested in medication therapy.  I agree with this as I think that risks of medication outweigh benefits in his case.  If things get worse in the future, we can certainly readdress.   Subjective:   Steven Huger. was seen today in the movement disorders clinic for neurologic consultation at the request of Lindell Spar, MD.  The consultation is for the evaluation of tremor.  Medical records are reviewed.  Patient is sent here for further evaluation of both resting and intention tremor.  Pt states that tremor isn't bothersome  Tremor: Yes.     How long has it been going on? Several years  At rest or with activation?  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  No.  Located where?  Bilateral UE  Affected by caffeine:  doesn't drink any  Affected by alcohol:  doesn't drink any  Affected by stress:  No.  Spills soup if on spoon:  No. (He goes to [employer] on Wednesdays and he will note that he may not be able to keep rice on fork but it also depends on how much sanding, etc he has done)  Spills glass of liquid if full:  No.  Affects ADL's (tying shoes, brushing teeth, etc):  No.  Tremor inducing meds:  No.  Other Specific Symptoms:  Voice: no change Sleep: sleeps well Wet Pillows: No. Postural symptoms:  No.  Falls?  No. Bradykinesia symptoms: no bradykinesia noted Loss of smell:  No. Loss of taste:  No. Urinary Incontinence:  No. Difficulty Swallowing:  No. Handwriting,  micrographia: No., but its a "mess" Lightheaded:  No.  Syncope: No. Diplopia:  No.  Last neuroimaging of the brain was a CT brain in 2011.  It was unremarkable at that time.  PREVIOUS MEDICATIONS: none to date  ALLERGIES:   Allergies  Allergen Reactions   Atorvastatin     myalgias   Lyrica [Pregabalin]     "went out of head"   Pravastatin     myalgias    CURRENT MEDICATIONS:  Current Outpatient Medications  Medication Instructions   acetaminophen (TYLENOL) 1,000 mg, Oral, Every 8 hours   clotrimazole-betamethasone (LOTRISONE) cream 1 Application, Topical, Daily   dorzolamide (TRUSOPT) 2 % ophthalmic solution 1 drop, Right Eye, 2 times daily   JANTOVEN 5 MG tablet TAKE 1 TABLET DAILY   losartan (COZAAR) 25 mg, Oral, Daily   nitroGLYCERIN (NITROSTAT) 0.4 MG SL tablet 1 TAB UNDER TONGUE EVERY 5 MINUTES X3 DOSES AS NEED CHEST PAIN. IF NO RELIEF AFTER 1ST DOSE GO TO ER   sildenafil (REVATIO) 20 MG tablet TAKE 2 TO 3 TABLETS BY MOUTH AS NEEDED 30 MINUTES BEFORE INTERCOURSE Strength: 20 mg    Objective:   PHYSICAL EXAMINATION:    VITALS:   Vitals:   06/01/22 0904  BP: (!) 151/74  Pulse: 73  SpO2: 97%  Weight: 173 lb 12.8 oz (78.8 kg)  Height: 5\' 8"  (1.727 m)    GEN:  The  patient appears stated age and is in NAD. HEENT:  Normocephalic, atraumatic.  The mucous membranes are moist. The superficial temporal arteries are without ropiness or tenderness. CV:  RRR Lungs:  CTAB Neck/HEME:  There are no carotid bruits bilaterally.  Neurological examination:  Orientation: The patient is alert and oriented x3.  Cranial nerves: There is good facial symmetry.  Extraocular muscles are intact. The visual fields are full to confrontational testing. The speech is fluent and clear. Soft palate rises symmetrically and there is no tongue deviation. Hearing is intact to conversational tone. Sensation: Sensation is intact to light touch throughout (facial, trunk, extremities). Vibration is  intact at the bilateral big toe. There is no extinction with double simultaneous stimulation.  Motor: Strength is 5/5 in the bilateral upper and lower extremities.   Shoulder shrug is equal and symmetric.  There is no pronator drift. Deep tendon reflexes: Deep tendon reflexes are 2/4 at the bilateral biceps, triceps, brachioradialis, patella and achilles. Plantar responses are downgoing bilaterally.  Movement examination: Tone: There is nl tone in the bilateral upper extremities.  The tone in the lower extremities is nl.  Abnormal movements: no rest tremor.  No postural tremor.  There is intention tremor, mild.  Min tremor with archimedes spirals on the L>R.  Tremor evident when pouring water from one glass to another but doesn't spill water Coordination:  There is no decremation with RAM's, with any form of RAMS, including alternating supination and pronation of the forearm, hand opening and closing, finger taps, heel taps and toe taps.  Gait and Station: The patient has no difficulty arising out of a deep-seated chair without the use of the hands. The patient's stride length is good.  I have reviewed and interpreted the following labs independently   Chemistry      Component Value Date/Time   NA 141 04/30/2022 0841   K 4.0 04/30/2022 0841   CL 103 04/30/2022 0841   CO2 23 04/30/2022 0841   BUN 17 04/30/2022 0841   CREATININE 1.06 04/30/2022 0841   CREATININE 0.91 01/03/2013 0934      Component Value Date/Time   CALCIUM 9.2 04/30/2022 0841   ALKPHOS 72 04/30/2022 0841   AST 18 04/30/2022 0841   ALT 19 04/30/2022 0841   BILITOT 1.0 04/30/2022 0841      Lab Results  Component Value Date   TSH 2.350 04/30/2022   Lab Results  Component Value Date   WBC 4.8 04/30/2022   HGB 14.0 04/30/2022   HCT 40.6 04/30/2022   MCV 87 04/30/2022   PLT 210 04/30/2022      Cc:  Lindell Spar, MD

## 2022-06-01 ENCOUNTER — Encounter: Payer: Self-pay | Admitting: Neurology

## 2022-06-01 ENCOUNTER — Ambulatory Visit: Payer: BC Managed Care – PPO | Admitting: Neurology

## 2022-06-01 VITALS — BP 151/74 | HR 73 | Ht 68.0 in | Wt 173.8 lb

## 2022-06-01 DIAGNOSIS — G250 Essential tremor: Secondary | ICD-10-CM

## 2022-06-01 NOTE — Patient Instructions (Signed)
It was so good to see you today!  If tremor becomes worse or is bothersome, please let us know.    Essential Tremor A tremor is trembling or shaking that a person cannot control. Most tremors affect the hands or arms. Tremors can also affect the head, vocal cords, legs, and other parts of the body. Essential tremor is a tremor without a known cause. Usually, it occurs while a person is trying to perform an action. It tends to get worse gradually as a person ages. What are the causes? The cause of this condition is not known, but it often runs in families. What increases the risk? You are more likely to develop this condition if: You have a family member with essential tremor. You are 76 years of age or older. What are the signs or symptoms? The main sign of a tremor is a rhythmic shaking of certain parts of your body that is uncontrolled and unintentional. You may: Have difficulty eating with a spoon or fork. Have difficulty writing. Nod your head up and down or side to side. Have a quivering voice. The shaking may: Get worse over time. Come and go. Be more noticeable on one side of your body. Get worse due to stress, tiredness (fatigue), caffeine, and extreme heat or cold. How is this diagnosed? This condition may be diagnosed based on: Your symptoms and medical history. A physical exam. There is no single test to diagnose an essential tremor. However, your health care provider may order tests to rule out other causes of your condition. These may include: Blood and urine tests. Imaging studies of your brain, such as a CT scan or MRI. How is this treated? Treatment for essential tremor depends on the severity of the condition. Mild tremors may not need treatment if they do not affect your day-to-day life. Severe tremors may need to be treated using one or more of the following options: Medicines. Injections of a substance called botulinum toxin. Procedures such as deep brain  stimulation (DBS) implantation or MRI-guided ultrasound treatment. Lifestyle changes. Occupational or physical therapy. Follow these instructions at home: Lifestyle  Do not use any products that contain nicotine or tobacco. These products include cigarettes, chewing tobacco, and vaping devices, such as e-cigarettes. If you need help quitting, ask your health care provider. Limit your caffeine intake as told by your health care provider. Try to get 8 hours of sleep each night. Find ways to manage your stress that fit your lifestyle and personality. Consider trying meditation or yoga. Try to anticipate stressful situations and allow extra time to manage them. If you are struggling emotionally with the effects of your tremor, consider working with a mental health provider. General instructions Take over-the-counter and prescription medicines only as told by your health care provider. Avoid extreme heat and extreme cold. Keep all follow-up visits. This is important. Visits may include physical therapy visits. Where to find more information [employer] of Neurological Disorders and Stroke: [employer] Contact a health care provider if: You experience any changes in the location or intensity of your tremors. You start having a tremor after starting a new medicine. You have a tremor with other symptoms, such as: Numbness. Tingling. Pain. Weakness. Your tremor gets worse. Your tremor interferes with your daily life. You feel down, blue, or sad for at least 2 weeks in a row. Worrying about your tremor and what other people think about you interferes with your everyday life functions, including relationships, work, or school. Summary Essential  tremor is a tremor without a known cause. Usually, it occurs when you are trying to perform an action. You are more likely to develop this condition if you have a family member with essential tremor. The main sign of a tremor is a rhythmic  shaking of certain parts of your body that is uncontrolled and unintentional. Treatment for essential tremor depends on the severity of the condition. This information is not intended to replace advice given to you by your health care provider. Make sure you discuss any questions you have with your health care provider. Document Revised: 12/27/2020 Document Reviewed: 12/27/2020 Elsevier Patient Education  [place].

## 2022-06-25 ENCOUNTER — Other Ambulatory Visit: Payer: Self-pay

## 2022-06-25 ENCOUNTER — Emergency Department (HOSPITAL_BASED_OUTPATIENT_CLINIC_OR_DEPARTMENT_OTHER): Payer: BC Managed Care – PPO

## 2022-06-25 ENCOUNTER — Emergency Department (HOSPITAL_BASED_OUTPATIENT_CLINIC_OR_DEPARTMENT_OTHER)
Admission: EM | Admit: 2022-06-25 | Discharge: 2022-06-25 | Disposition: A | Discharge status: Home / Self Care | Payer: BC Managed Care – PPO | Attending: Emergency Medicine | Admitting: Emergency Medicine

## 2022-06-25 ENCOUNTER — Encounter (HOSPITAL_BASED_OUTPATIENT_CLINIC_OR_DEPARTMENT_OTHER): Payer: Self-pay | Admitting: *Deleted

## 2022-06-25 DIAGNOSIS — R0602 Shortness of breath: Secondary | ICD-10-CM | POA: Diagnosis not present

## 2022-06-25 DIAGNOSIS — I700 Atherosclerosis of aorta: Secondary | ICD-10-CM | POA: Diagnosis not present

## 2022-06-25 DIAGNOSIS — R000 Tachycardia, unspecified: Secondary | ICD-10-CM | POA: Diagnosis not present

## 2022-06-25 DIAGNOSIS — I2510 Atherosclerotic heart disease of native coronary artery without angina pectoris: Secondary | ICD-10-CM | POA: Insufficient documentation

## 2022-06-25 LAB — CBC
HCT: 41.5 % (ref 39.0–52.0)
Hemoglobin: 14 g/dL (ref 13.0–17.0)
MCH: 29.4 pg (ref 26.0–34.0)
MCHC: 33.7 g/dL (ref 30.0–36.0)
MCV: 87.2 fL (ref 80.0–100.0)
Platelets: 239 10*3/uL (ref 150–400)
RBC: 4.76 MIL/uL (ref 4.22–5.81)
RDW: 12.8 % (ref 11.5–15.5)
WBC: 6.8 10*3/uL (ref 4.0–10.5)
nRBC: 0 % (ref 0.0–0.2)

## 2022-06-25 LAB — BASIC METABOLIC PANEL
Anion gap: 10 (ref 5–15)
BUN: 18 mg/dL (ref 8–23)
CO2: 23 mmol/L (ref 22–32)
Calcium: 8.7 mg/dL — ABNORMAL LOW (ref 8.9–10.3)
Chloride: 101 mmol/L (ref 98–111)
Creatinine, Ser: 1.21 mg/dL (ref 0.61–1.24)
GFR, Estimated: >60 mL/min (ref >60)
Glucose, Bld: 99 mg/dL (ref 70–99)
Potassium: 3.6 mmol/L (ref 3.5–5.1)
Sodium: 134 mmol/L — ABNORMAL LOW (ref 135–145)

## 2022-06-25 LAB — TROPONIN I (HIGH SENSITIVITY)
Troponin I (High Sensitivity): 6 ng/L (ref <18)
Troponin I (High Sensitivity): 8 ng/L (ref <18)

## 2022-06-25 MED ORDER — IOHEXOL 350 MG/ML SOLN
75.0000 mL | Freq: Once | INTRAVENOUS | Status: AC | PRN
  Administered 2022-06-25: 75 mL via INTRAVENOUS

## 2022-06-25 NOTE — ED Provider Notes (Signed)
[facility] Provider Note   CSN: [id number] Arrival date & time: 06/25/22  1859     History  Chief Complaint  Patient presents with   Shortness of [place] Dupler Jr. is a 77 y.o. male.  The history is provided by the patient and medical records. No language interpreter was used.  Shortness of Breath Severity:  Moderate Onset quality:  Sudden Duration:  1 hour Timing:  Constant Progression:  Improving Chronicity:  New Relieved by:  Nothing Worsened by:  Coughing Associated symptoms: cough and sputum production   Associated symptoms: no abdominal pain, no chest pain, no fever, no headaches, no hemoptysis, no neck pain, no rash, no vomiting and no wheezing   Risk factors: hx of PE/DVT        Home Medications Prior to Admission medications   Medication Sig Start Date End Date Taking? Authorizing Provider  acetaminophen (TYLENOL) 500 MG tablet Take 2 tablets (1,000 mg total) by mouth every 8 (eight) hours. Patient taking differently: Take 1,000 mg by mouth every 8 (eight) hours as needed (pain/headaches.). 05/01/15   Danae Orleans, PA-C  clotrimazole-betamethasone (LOTRISONE) cream Apply 1 Application topically daily. 04/15/22   Lindell Spar, MD  dorzolamide (TRUSOPT) 2 % ophthalmic solution Place 1 drop into the right eye in the morning and at bedtime.    [provider]  JANTOVEN 5 MG tablet TAKE 1 TABLET DAILY 02/10/22   Lindell Spar, MD  losartan (COZAAR) 25 MG tablet Take 1 tablet (25 mg total) by mouth daily. 05/14/22   Arnoldo Lenis, MD  nitroGLYCERIN (NITROSTAT) 0.4 MG SL tablet 1 TAB UNDER TONGUE EVERY 5 MINUTES X3 DOSES AS NEED CHEST PAIN. IF NO RELIEF AFTER 1ST DOSE GO TO ER 02/19/20   Arnoldo Lenis, MD  sildenafil (REVATIO) 20 MG tablet TAKE 2 TO 3 TABLETS BY MOUTH AS NEEDED 30 MINUTES BEFORE INTERCOURSE Strength: 20 mg 05/14/22   Arnoldo Lenis, MD      Allergies    Atorvastatin,  Lyrica [pregabalin], and Pravastatin    Review of Systems   Review of Systems  Constitutional:  Negative for chills, fatigue and fever.  HENT:  Negative for congestion.   Respiratory:  Positive for cough, sputum production and shortness of breath. Negative for hemoptysis, chest tightness, wheezing and stridor.   Cardiovascular:  Negative for chest pain, palpitations and leg swelling.  Gastrointestinal:  Negative for abdominal pain, constipation, diarrhea, nausea and vomiting.  Genitourinary:  Negative for dysuria and flank pain.  Musculoskeletal:  Negative for back pain, neck pain and neck stiffness.  Skin:  Negative for rash and wound.  Neurological:  Negative for light-headedness and headaches.  Psychiatric/Behavioral:  Negative for agitation and confusion.   All other systems reviewed and are negative.   Physical Exam Updated Vital Signs BP 139/88 (BP Location: Right Arm)   Pulse (!) 106   Temp 97.9 F (36.6 C) (Oral)   Resp 14   SpO2 97%  Physical Exam Vitals and nursing note reviewed.  Constitutional:      General: He is not in acute distress.    Appearance: He is well-developed. He is not ill-appearing, toxic-appearing or diaphoretic.  HENT:     Head: Normocephalic and atraumatic.     Mouth/Throat:     Mouth: Mucous membranes are moist.  Eyes:     Conjunctiva/sclera: Conjunctivae normal.  Cardiovascular:     Rate and Rhythm: Regular rhythm. Tachycardia  present.     Heart sounds: No murmur heard. Pulmonary:     Effort: Pulmonary effort is normal. No tachypnea or respiratory distress.     Breath sounds: Normal breath sounds. No stridor. No wheezing, rhonchi or rales.  Chest:     Chest wall: No tenderness.  Abdominal:     Palpations: Abdomen is soft.     Tenderness: There is no abdominal tenderness.  Musculoskeletal:        General: No swelling.     Cervical back: Neck supple.     Right lower leg: No tenderness. No edema.     Left lower leg: No tenderness. No  edema.  Skin:    General: Skin is warm and dry.     Capillary Refill: Capillary refill takes less than 2 seconds.  Neurological:     Mental Status: He is alert.  Psychiatric:        Mood and Affect: Mood normal.     ED Results / Procedures / Treatments   Labs (all labs ordered are listed, but only abnormal results are displayed) Labs Reviewed  BASIC METABOLIC PANEL - Abnormal; Notable for the following components:      Result Value   Sodium 134 (*)    Calcium 8.7 (*)    All other components within normal limits  CBC  TROPONIN I (HIGH SENSITIVITY)  TROPONIN I (HIGH SENSITIVITY)    EKG EKG Interpretation  Date/Time:  Thursday June 25 2022 19:17:10 EDT Ventricular Rate:  97 PR Interval:  152 QRS Duration: 92 QT Interval:  344 QTC Calculation: 437 R Axis:   52 Text Interpretation: Sinus rhythm Probable left atrial enlargement Abnormal R-wave progression, early transition when compared to prior, faster rate. No STEMI Confirmed by Antony Blackbird [phone]) on 06/25/2022 7:43:54 PM  Radiology CT Angio Chest PE W and/or Wo Contrast  Result Date: 06/25/2022 CLINICAL DATA:  Concern for pulmonary embolism. EXAM: CT ANGIOGRAPHY CHEST WITH CONTRAST TECHNIQUE: Multidetector CT imaging of the chest was performed using the standard protocol during bolus administration of intravenous contrast. Multiplanar CT image reconstructions and MIPs were obtained to evaluate the vascular anatomy. RADIATION DOSE REDUCTION: This exam was performed according to the departmental dose-optimization program which includes automated exposure control, adjustment of the mA and/or kV according to patient size and/or use of iterative reconstruction technique. CONTRAST:  7mL OMNIPAQUE IOHEXOL 350 MG/ML SOLN COMPARISON:  Chest CT dated 06/21/2017. FINDINGS: Evaluation of this exam is limited due to respiratory motion artifact. Cardiovascular: There is no cardiomegaly or pericardial effusion. There is advanced 3 vessel  coronary vascular calcification. There is mild atherosclerotic calcification of the thoracic aorta. No aneurysmal dilatation or dissection. Similar appearance of irregularity of the aortic arch. The origins of the great vessels of the aortic arch appear patent. Evaluation of the pulmonary arteries is limited due to respiratory motion. No central pulmonary artery embolus identified. Mediastinum/Nodes: No hilar or mediastinal adenopathy. The esophagus is grossly unremarkable. No mediastinal fluid collection. Lungs/Pleura: There is eventration of the right hemidiaphragm. No focal consolidation, pleural effusion, or pneumothorax. The central airways are patent. Upper Abdomen: Gallstones. Musculoskeletal: No acute osseous pathology. Review of the MIP images confirms the above findings. IMPRESSION: 1. No acute intrathoracic pathology. No CT evidence of central pulmonary artery embolus. 2. Advanced 3 vessel coronary vascular calcification. 3. Cholelithiasis. 4.  Aortic Atherosclerosis (ICD10-I70.0). Electronically Signed   By: Anner Crete M.D.   On: 06/25/2022 20:53   DG Chest 2 View  Result Date: 06/25/2022 CLINICAL DATA:  Acute onset shortness of breath EXAM: CHEST - 2 VIEW COMPARISON:  Chest radiograph dated 06/26/2020 FINDINGS: Normal lung volumes. No focal consolidations. Vertically oriented interface along the medial left apex. The heart size and mediastinal contours are within normal limits. Cervical spinal fixation hardware appears intact. The visualized skeletal structures are unremarkable. IMPRESSION: Findings suspicious for small medial left apical pneumothorax. These results were called by telephone at the time of interpretation on 06/25/2022 at 7:43 pm to provider [facility] , who verbally acknowledged these results. Electronically Signed   By: Darrin Nipper M.D.   On: 06/25/2022 19:44    Procedures Procedures    Medications Ordered in ED Medications  iohexol (OMNIPAQUE) 350 MG/ML injection  75 mL (75 mLs Intravenous Contrast Given 06/25/22 2032)    ED Course/ Medical Decision Making/ A&P                             Medical Decision Making Amount and/or Complexity of Data Reviewed Labs: ordered. Radiology: ordered.  Risk Prescription drug management.    Boy Fabry. is a 77 y.o. male with a past medical history significant for CAD with previous PCI, dyslipidemia, hypertension, DVT/PE on Jantoven, kidney stones, vertigo, and family history of malignant hyperthermia who presents for shortness of breath.  According to patient, last week he had a sinus infection versus viral illness and had congestion and coughing.  He has had some coughing ever since but has not had any chest pain or shortness of breath with it.  He reports that about 6 PM this evening, he was coming in from work and he started coughing and then had shortness of breath.  He denied any chest pain or tightness and denied any diaphoresis, nausea, or vomiting.  Denies any back pain or flank pain.  Denies any leg pain or leg swelling.  Reports he is unsure if this feels like when he had previous blood clot.  He had the shortness of breath and presented for evaluation.  On exam, lungs were symmetric with sounds.  They were clear.  No significant rhonchi.  No wheezing.  No stridor.  Chest and back nontender.  Good pulses in extremities.  Abdomen nontender.  Good pulses in lower extremities and had no significant edema seen.  Patient was slightly tachycardic around 110 and was not hypoxic.  Afebrile.  EKG did not show STEMI.  Patient had a chest x-ray in triage that was read as possible pneumothorax.  I spoke with patient and they were told that due to the blood clots he had years ago he had some injury and scarring and may have abnormal x-rays in the future.  Given his lack of pain and clear breath sounds, I do agree with radiology that we need to get CT imaging to further evaluate.  As has history of blood clots, will  get a CT PE study to further evaluate to rule out pneumonia from his recent infection, pneumothorax, or even recurrent PE.  Anticipate reassessment after workup to determine disposition.  CT scan shows no evidence of pulmonary embolism pneumothorax or other acute abnormality.  Patient continues to feel well improved ability for over 4 hours without any recurrent chest pain or shortness of breath.  He had no further rash and is doing well.  Suspect he may have had a very brief transient allergic reaction however given his lack of other symptoms, we will hold on steroids or other intervention.  Patient feels comfortable calling his doctor to get seen in the next 2 days and understands return precautions and follow-up instructions.  Patient discharged in good condition with resolution of symptoms with reassuring workup here.         Final Clinical Impression(s) / ED Diagnoses Final diagnoses:  SOB (shortness of breath)    Rx / DC Orders ED Discharge Orders     None      Clinical Impression: 1. SOB (shortness of breath)     Disposition: Discharge  Condition: Good  I have discussed the results, Dx and Tx plan with the pt(& family if present). He/she/they expressed understanding and agree(s) with the plan. Discharge instructions discussed at great length. Strict return precautions discussed and pt &/or family have verbalized understanding of the instructions. No further questions at time of discharge.    New Prescriptions   No medications on file    Follow Up: Lindell Spar, MD [address] [place] [address] [facility] [address] [id number] [place] [phone]       Yanni Quiroa, Gwenyth Allegra, MD 06/25/22 2330

## 2022-06-25 NOTE — Discharge Instructions (Signed)
Your history, exam, evaluation today led [place] to get workup to look for concerning causes of your short of breath coming from your heart or your lungs.  The x-ray showed possible collapsed lung but the CT scan did not show this.  No evidence of blood clot or pneumonia.  Your labs including both sets of heart enzymes we checked were normal.  Given your stability and reassuring vital signs we feel you are safe for discharge home.  Is unclear if you had some brief reaction causing the rash and shortness of breath however as your symptoms have resolved we agreed to hold on steroids or other medications initially but do want you to call your doctor tomorrow to get seen soon.  If any symptoms change recur or worsen, please return to the nearest emergency department.

## 2022-06-25 NOTE — ED Notes (Signed)
ED Provider at bedside. 

## 2022-06-25 NOTE — ED Triage Notes (Signed)
Pt is here for sob which began about an hour ago.  No CP with this.  Pt also has generalized rash which he noted this evening.

## 2022-07-02 ENCOUNTER — Telehealth: Payer: Self-pay | Admitting: Cardiology

## 2022-07-02 DIAGNOSIS — H401212 Low-tension glaucoma, right eye, moderate stage: Secondary | ICD-10-CM | POA: Diagnosis not present

## 2022-07-02 NOTE — Telephone Encounter (Signed)
Patient notified and verbalized understanding.  States his symptoms have resolved.  He will keep his  12/07/22 appointment as scheduled with Dr. Wyline Mood.

## 2022-07-02 NOTE — Telephone Encounter (Signed)
Extensive evaluatin including CT scan was all benign, no worrisome findings for his symptoms. Have his symptoms resolved? If ongoing please get him in with Diana Eves MD

## 2022-07-02 NOTE — Telephone Encounter (Signed)
Pt wanted Dr. Wyline Mood to be made aware that he was seen in the ER at [facility], he would like for Dr. Wyline Mood to review his chart and let him know if there's anything he needs to be doing.  [phone]

## 2022-07-09 DIAGNOSIS — H903 Sensorineural hearing loss, bilateral: Secondary | ICD-10-CM | POA: Diagnosis not present

## 2022-07-23 ENCOUNTER — Other Ambulatory Visit: Payer: Self-pay

## 2022-07-23 DIAGNOSIS — Z7901 Long term (current) use of anticoagulants: Secondary | ICD-10-CM

## 2022-07-24 LAB — PROTIME-INR
INR: 2 — ABNORMAL HIGH (ref 0.9–1.2)
Prothrombin Time: 20.8 s — ABNORMAL HIGH (ref 9.1–12.0)

## 2022-09-18 ENCOUNTER — Other Ambulatory Visit: Payer: Self-pay

## 2022-09-18 ENCOUNTER — Telehealth: Payer: Self-pay | Admitting: Internal Medicine

## 2022-09-18 MED ORDER — WARFARIN SODIUM 5 MG PO TABS: 5.0000 mg | ORAL_TABLET | Freq: Every day | ORAL | 1 refills | Status: DC

## 2022-09-18 NOTE — Telephone Encounter (Signed)
Refills sent to pharmacy. 

## 2022-09-18 NOTE — Telephone Encounter (Signed)
Prescription Request  09/18/2022  LOV: 04/15/2022  What is the name of the medication or equipment?   JANTOVEN 5 MG tablet   Have you contacted your pharmacy to request a refill? Yes   Which pharmacy would you like this sent to?    [facility] - [place], [place] - [phone] [place] [address] Phone: [phone]  Fax: [phone]    Patient notified that their request is being sent to the clinical staff for review and that they should receive a response within 2 business days.   Please advise patient when refill sent at [phone].

## 2022-10-05 ENCOUNTER — Other Ambulatory Visit (HOSPITAL_COMMUNITY): Payer: Self-pay

## 2022-10-15 ENCOUNTER — Encounter: Payer: Self-pay | Admitting: Internal Medicine

## 2022-10-15 ENCOUNTER — Ambulatory Visit: Payer: BC Managed Care – PPO | Admitting: Internal Medicine

## 2022-10-15 VITALS — BP 128/71 | HR 66 | Ht 68.0 in | Wt 174.8 lb

## 2022-10-15 DIAGNOSIS — Z7901 Long term (current) use of anticoagulants: Secondary | ICD-10-CM | POA: Diagnosis not present

## 2022-10-15 DIAGNOSIS — I2510 Atherosclerotic heart disease of native coronary artery without angina pectoris: Secondary | ICD-10-CM | POA: Diagnosis not present

## 2022-10-15 DIAGNOSIS — I10 Essential (primary) hypertension: Secondary | ICD-10-CM

## 2022-10-15 DIAGNOSIS — Z789 Other specified health status: Secondary | ICD-10-CM | POA: Diagnosis not present

## 2022-10-15 DIAGNOSIS — R7303 Prediabetes: Secondary | ICD-10-CM | POA: Diagnosis not present

## 2022-10-15 DIAGNOSIS — Z9861 Coronary angioplasty status: Secondary | ICD-10-CM | POA: Diagnosis not present

## 2022-10-15 NOTE — Assessment & Plan Note (Signed)
On Coumadin due to history of DVT and PE Did not tolerate statin in the past Did not tolerate Repatha as well Followed by Cardiology.

## 2022-10-15 NOTE — Assessment & Plan Note (Signed)
Takes Coumadin for DVT/PE Check INR 

## 2022-10-15 NOTE — Assessment & Plan Note (Addendum)
BP Readings from Last 1 Encounters:  10/15/22 128/71   Well-controlled with Losartan 25 mg QD Counseled for compliance with the medications Advised DASH diet and moderate exercise/walking, at least 150 mins/week

## 2022-10-15 NOTE — Progress Notes (Signed)
Established Patient Office Visit  Subjective:  Patient ID: [name]., male    DOB: 03/01/1946  Age: 77 y.o. MRN: [id number]  CC:  Chief Complaint  Patient presents with   Hypertension    Six month follow up     HPI [name]. is a 77 y.o. male with past medical history of CAD s/p stent placement, DVT/PE in 2009 -on Coumadin, erectile dysfunction, avascular necrosis of hip s/p THA, HLD and colonic polyps who presents for f/u of his chronic medical conditions.  HTN: BP is well-controlled. Takes medications regularly.  He was  placed on losartan instead of lisinopril due to cough.  Patient denies headache, dizziness, chest pain, dyspnea or palpitations.  HLD: His Zetia was discontinued due to diarrhea. He was placed on Repatha, but had severe diarrhea and GI discomfort with it.  He has statin intolerance.  He had DVT/PE in 2009, and has been taking Coumadin since then. He still wants to continue taking Coumadin for now.  He does have easy bruising.  Denies any active bleeding currently.  He gets INR checked every month or so.     Past Medical History:  Diagnosis Date   Anticoagulated on Coumadin    Avascular necrosis of hip (HCC)    LEFT   DVT (deep venous thrombosis) (HCC) 2009   Dyslipidemia, goal LDL below 70 04/09/2017   Family history of anesthesia complication    BROTHER HAD MALIGNANT HYPOTHERMIA - PT HAS NOT HAD ANY PROBLEMS WITH ANESTHESIA     History of kidney stones    Malignant hyperthermia    PT'S BROTHER HAS HX MALIGNANT HYPERTHERMIA   NSTEMI (non-ST elevated myocardial infarction) (HCC) 04/09/2017   Pulmonary embolism (HCC) 2009   Vertigo    NO RECENT PROBLEMS    Past Surgical History:  Procedure Laterality Date   BACK SURGERY  2009   DISCECTOMY   CATARACT EXTRACTION, BILATERAL     CERVICAL FUSION  2008   COLONOSCOPY  2005   Dr. Madilyn Fireman: normal   COLONOSCOPY  2010   Dr. Jena Gauss, +Polyps but no path available. Surveillance 5 years    COLONOSCOPY  2016   Dr. Madilyn Fireman   COLONOSCOPY WITH PROPOFOL N/A 01/15/2020   Procedure: COLONOSCOPY WITH PROPOFOL;  Surgeon: Corbin Ade, MD;  [facility];  Service: Endoscopy;  Laterality: N/A;  10:15am   CORONARY STENT INTERVENTION N/A 04/09/2017   Procedure: CORONARY STENT INTERVENTION;  Surgeon: Yvonne Kendall, MD;  [facility]: [facility];  Service: Cardiovascular;  Laterality: N/A;   CORONARY STENT INTERVENTION N/A 04/12/2017   Procedure: CORONARY STENT INTERVENTION;  Surgeon: Yvonne Kendall, MD;  [facility]: [facility];  Service: Cardiovascular;  Laterality: N/A;   CORONARY ULTRASOUND/IVUS N/A 04/12/2017   Procedure: Intravascular Ultrasound/IVUS;  Surgeon: Yvonne Kendall, MD;  [facility];  Service: Cardiovascular;  Laterality: N/A;   LEFT HEART CATH AND CORONARY ANGIOGRAPHY N/A 04/09/2017   Procedure: LEFT HEART CATH AND CORONARY ANGIOGRAPHY;  Surgeon: Yvonne Kendall, MD;  [facility];  Service: Cardiovascular;  Laterality: N/A;   TOTAL HIP ARTHROPLASTY Right 12/22/2013   Procedure: RIGHT TOTAL HIP ARTHROPLASTY ANTERIOR APPROACH;  Surgeon: Shelda Pal, MD;  Location: [facility];  Service: Orthopedics;  Laterality: Right;   TOTAL HIP ARTHROPLASTY Left 04/30/2015   Procedure: LEFT TOTAL HIP ARTHROPLASTY ANTERIOR APPROACH;  Surgeon: Durene Romans, MD;  Location: [facility];  Service: Orthopedics;  Laterality: Left;    Family  History  Problem Relation Age of Onset   Breast cancer Mother    Hypertension Mother    Hyperlipidemia Mother    Heart attack Mother 74   Stroke Mother    Cirrhosis Sister    Cancer Brother    Malignant hyperthermia Brother    Hypertension Paternal Grandmother    Diabetes Paternal Grandmother    Colon cancer Neg Hx     Social History   Socioeconomic History   Marital status: Married    Spouse name: Not on file   Number of children: Not on file   Years of education: Not on file   Highest  education level: Not on file  Occupational History   Occupation: self employed, Fish farm manager  Tobacco Use   Smoking status: Never   Smokeless tobacco: Never  Vaping Use   Vaping status: Never Used  Substance and Sexual Activity   Alcohol use: No   Drug use: No   Sexual activity: Not on file  Other Topics Concern   Not on file  Social History Narrative   Right handed    Retired but still working    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 (Patient taking differently: Take 1,000 mg by mouth every 8 (eight) hours as needed (pain/headaches.).) 30 tablet 0   clotrimazole-betamethasone (LOTRISONE) cream Apply 1 Application topically daily. 30 g 0   dorzolamide (TRUSOPT) 2 % ophthalmic solution Place 1 drop into the right eye in the morning and at bedtime.     losartan (COZAAR) 25 MG tablet Take 1 tablet (25 mg total) by mouth daily. 30 tablet 6   nitroGLYCERIN (NITROSTAT) 0.4 MG SL tablet 1 TAB UNDER TONGUE EVERY 5 MINUTES X3 DOSES AS NEED CHEST PAIN. IF NO RELIEF AFTER 1ST DOSE GO TO ER 25 tablet 3   sildenafil (REVATIO) 20 MG tablet TAKE 2 TO 3 TABLETS BY MOUTH AS NEEDED 30 MINUTES BEFORE INTERCOURSE Strength: 20 mg 90 tablet 1   warfarin (JANTOVEN) 5 MG tablet Take 1 tablet (5 mg total) by mouth daily. 90 tablet 1   No facility-administered medications prior to visit.    Allergies  Allergen Reactions   Atorvastatin     myalgias   Lyrica [Pregabalin]     "went out of head"   Pravastatin     myalgias    ROS Review of Systems  Constitutional:  Negative for chills and  fever.  HENT:  Negative for congestion and sore throat.   Eyes:  Negative for pain and discharge.  Respiratory:  Negative for cough and shortness of breath.   Cardiovascular:  Negative for chest pain and palpitations.  Gastrointestinal:  Negative for constipation, diarrhea, nausea and vomiting.  Endocrine: Negative for polydipsia and polyuria.  Genitourinary:  Negative for dysuria and hematuria.  Musculoskeletal:  Negative for neck pain and neck stiffness.  Skin:  Negative for rash.  Neurological:  Negative for dizziness, weakness, numbness and headaches.  Hematological:  Bruises/bleeds easily.  Psychiatric/Behavioral:  Negative for agitation and behavioral problems.       Objective:  Physical Exam Vitals reviewed.  Constitutional:      General: He is not in acute distress.    Appearance: He is not diaphoretic.  HENT:     Head: Normocephalic and atraumatic.     Nose: No congestion.     Mouth/Throat:     Mouth: Mucous membranes are moist.  Eyes:     General: No scleral icterus.    Extraocular Movements: Extraocular movements intact.  Cardiovascular:     Rate and Rhythm: Normal rate and regular rhythm.     Pulses: Normal pulses.     Heart sounds: Normal heart sounds. No murmur heard. Pulmonary:     Breath sounds: Normal breath sounds. No wheezing or rales.  Musculoskeletal:     Cervical back: Neck supple. No tenderness.     Right lower leg: No edema.     Left lower leg: No edema.  Skin:    General: Skin is warm.     Findings: No rash.  Neurological:     General: No focal deficit present.     Mental Status: He is alert and oriented to person, place, and time.     Sensory: No sensory deficit.     Motor: No weakness.  Psychiatric:        Mood and Affect: Mood normal.        Behavior: Behavior normal.     BP 128/71 (BP Location: Right Arm, Patient Position: Sitting, Cuff Size: Normal)   Pulse 66   Ht 5\' 8"  (1.727 m)   Wt 174 lb 12.8 oz (79.3 kg)   SpO2 96%    BMI 26.58 kg/m  Wt Readings from Last 3 Encounters:  10/15/22 174 lb 12.8 oz (79.3 kg)  06/01/22 173 lb 12.8 oz (78.8 kg)  05/14/22 171 lb (77.6 kg)    Lab Results  Component Value Date   TSH 2.350 04/30/2022   Lab Results  Component Value Date   WBC 6.8 06/25/2022   HGB 14.0 06/25/2022   HCT 41.5 06/25/2022   MCV 87.2 06/25/2022   PLT 239 06/25/2022   Lab Results  Component Value Date   NA 134 (L) 06/25/2022   K 3.6 06/25/2022   CO2 23 06/25/2022   GLUCOSE 99 06/25/2022   BUN 18 06/25/2022   CREATININE 1.21 06/25/2022   BILITOT 1.0 04/30/2022   ALKPHOS 72 04/30/2022   AST 18 04/30/2022   ALT 19 04/30/2022   PROT 7.0 04/30/2022   ALBUMIN 4.4 04/30/2022   CALCIUM 8.7 (L) 06/25/2022   ANIONGAP 10 06/25/2022   EGFR 73 04/30/2022   Lab Results  Component Value Date   CHOL 192 04/30/2022   Lab Results  Component Value Date   HDL 38 (L) 04/30/2022   Lab Results  Component Value Date   LDLCALC 129 (H) 04/30/2022   Lab Results  Component Value Date   TRIG 137 04/30/2022   Lab Results  Component Value Date   CHOLHDL 5.1 (H) 04/30/2022   Lab Results  Component Value Date   HGBA1C 6.0 (H) 04/30/2022      Assessment & Plan:   Problem List Items Addressed This Visit       Cardiovascular and Mediastinum   CAD S/P percutaneous coronary angioplasty - Primary    On Coumadin due to history of DVT and PE Did not tolerate statin in the past Did not tolerate Repatha as well Followed by Cardiology      Essential hypertension    BP Readings from Last 1  Encounters:  10/15/22 128/71   Well-controlled with Losartan 25 mg QD Counseled for compliance with the medications Advised DASH diet and moderate exercise/walking, at least 150 mins/week      Relevant Orders   CBC with Differential/Platelet   CMP14+EGFR     Other   Long term (current) use of anticoagulants    Takes Coumadin for DVT/PE Check INR      Relevant Orders   CBC with  Differential/Platelet   INR/PT   Statin intolerance    Has had severe GI discomfort and myalgias with statin in the past      Prediabetes    Lab Results  Component Value Date   HGBA1C 6.0 (H) 04/30/2022   Advised to follow low carb diet      Relevant Orders   Hemoglobin A1c     No orders of the defined types were placed in this encounter.   Follow-up: Return in about 6 months (around 04/17/2023) for Annual physical.    Anabel Halon, MD

## 2022-10-15 NOTE — Assessment & Plan Note (Signed)
Has had severe GI discomfort and myalgias with statin in the past

## 2022-10-15 NOTE — Patient Instructions (Signed)
Please continue to take medications as prescribed. ? ?Please continue to follow low carb diet and perform moderate exercise/walking at least 150 mins/week. ?

## 2022-10-15 NOTE — Assessment & Plan Note (Signed)
Lab Results  Component Value Date   HGBA1C 6.0 (H) 04/30/2022   Advised to follow low carb diet

## 2022-10-16 ENCOUNTER — Other Ambulatory Visit: Payer: Self-pay | Admitting: Internal Medicine

## 2022-10-16 DIAGNOSIS — R791 Abnormal coagulation profile: Secondary | ICD-10-CM

## 2022-11-03 DIAGNOSIS — R791 Abnormal coagulation profile: Secondary | ICD-10-CM | POA: Diagnosis not present

## 2022-11-17 DIAGNOSIS — M79672 Pain in left foot: Secondary | ICD-10-CM | POA: Diagnosis not present

## 2022-11-17 DIAGNOSIS — R2242 Localized swelling, mass and lump, left lower limb: Secondary | ICD-10-CM | POA: Diagnosis not present

## 2022-11-18 ENCOUNTER — Telehealth: Payer: Self-pay

## 2022-11-18 NOTE — Telephone Encounter (Signed)
..     Pre-operative Risk Assessment    Patient Name: [name].  DOB: 16-May-1945 MRN: [id number]    LAST O/V 05/14/22 NEXT APPT 12/07/22  Request for Surgical Clearance    Procedure:   LEFT 2ND TOE CYST EXCISIONAL BIOPSY AND 2ND TOE FUSION WITH POSSIBLE PINNING  Date of Surgery:  Clearance TBD                                 Surgeon:  DR Netta Cedars Surgeon's Group or Practice Name:  [facility] Phone number:  [phone] Fax number:  [phone]   Type of Clearance Requested:   - Medical  - Pharmacy:  Hold Warfarin (Coumadin)     Type of Anesthesia:  Not Indicated   Additional requests/questions:    Jola Babinski   11/18/2022, 11:20 AM

## 2022-11-18 NOTE — Telephone Encounter (Signed)
   Name: [name].  DOB: 1945/08/07  MRN: [id number]  Primary Cardiologist: Dina Rich, MD  Chart reviewed as part of pre-operative protocol coverage. Because of DEMARION OVERBAUGH Jr.'s past medical history and time since last visit, he will require a follow-up in-office visit in order to better assess preoperative cardiovascular risk.  Patient has an office visit scheduled with Dr. Wyline Mood on 12/07/2022. Appointment notes have been updated to reflect need for pre-op evaluation.   Pre-op covering staff:  - Please contact requesting surgeon's office via preferred method (i.e, phone, fax) to inform them of need for appointment prior to surgery.  Coumadin prescribed by a noncardiology provider (PCP) therefore recommendations for holding deferred to prescribing provider.    Carlos Levering, NP  11/18/2022, 4:01 PM

## 2022-12-07 ENCOUNTER — Ambulatory Visit: Payer: BC Managed Care – PPO | Attending: Cardiology | Admitting: Cardiology

## 2022-12-07 ENCOUNTER — Encounter: Payer: Self-pay | Admitting: Cardiology

## 2022-12-07 VITALS — BP 124/70 | HR 70 | Ht 67.5 in | Wt 178.0 lb

## 2022-12-07 DIAGNOSIS — I10 Essential (primary) hypertension: Secondary | ICD-10-CM | POA: Diagnosis not present

## 2022-12-07 DIAGNOSIS — E782 Mixed hyperlipidemia: Secondary | ICD-10-CM

## 2022-12-07 DIAGNOSIS — I2510 Atherosclerotic heart disease of native coronary artery without angina pectoris: Secondary | ICD-10-CM | POA: Diagnosis not present

## 2022-12-07 NOTE — Progress Notes (Signed)
Clinical Summary Mr. Aschoff is a 77 y.o.male seen today for follow up of the following medical problems.      1. CAD - admit Jan 2019 with subcascpular pain, found to have NSTEMI peak trop 31 - cath Jan 2019 staged PCI LCX and LAD.  - plans were for triple therapy with asa, plavix, coumdin (for history of PE) x 1 month, then plavix and coumadin - Jan 2019 echo LVEF 60-65%, no wma's, grade I diatolic dysfunction.    - previously came off beta blocker and statin due to diarrhea. He tried taking pravastatin low dose and did so for a few months but had recurrent diarrhea and ended up stopping     -no recent chest pains, no SOB/DOE - compliant with meds   2. History of bilateral PE -  occurred after immobility after back surgery in 10/2017 - 09/2013 LE US showed chronic DVT left femoral vein - 06/2017 CT PE no PE - 06/2017 LE venous US: left popliteal vein DVT new compared to 09/2013 study. Chronic DVT left femoral and tibial veins. Chronic DVT left deep femoral vein.   - on life long coumadin from notes, notes indicate this was recommended by hematology in the past - seen again by hematology, decision made to continue anticoagulation.     - no bleeding on coumadin.       3. Hyperlipidemia - 11/2018 TC 187 TG 110 HDL 42 LDL 125 - intolerant to statins   - last visit 07/2019 was to start zetia 10mg  daily.  - 12/2019 TC 144 TG 134 HDL 32 LDL 88   - reports zetia caused diarrhea. Stopped taking. - seen in lipid clinic, was to try repatha - diarrhea on repatha, has stopped taking     04/2022 TC 192 TG 137 HDL 38 LDL 129 - has not been intersted in leqvio  4. 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     Past Medical History:  Diagnosis Date   Anticoagulated on Coumadin    Avascular necrosis of hip (HCC)    LEFT   DVT (deep venous thrombosis) (HCC) 2009   Dyslipidemia, goal LDL below 70  04/09/2017   Family history of anesthesia complication    BROTHER HAD MALIGNANT HYPOTHERMIA - PT HAS NOT HAD ANY PROBLEMS WITH ANESTHESIA     History of kidney stones    Malignant hyperthermia    PT'S BROTHER HAS HX MALIGNANT HYPERTHERMIA   NSTEMI (non-ST elevated myocardial infarction) (HCC) 04/09/2017   Pulmonary embolism (HCC) 2009   Vertigo    NO RECENT PROBLEMS     Allergies  Allergen Reactions   Atorvastatin     myalgias   Lyrica [Pregabalin]     "went out of head"   Pravastatin     myalgias     Current Outpatient Medications  Medication Sig Dispense Refill   acetaminophen (TYLENOL) 500 MG tablet Take 2 tablets (1,000 mg total) by mouth every 8 (eight) hours. (Patient taking differently: Take 1,000 mg by mouth every 8 (eight) hours as needed (pain/headaches.).) 30 tablet 0   clotrimazole-betamethasone (LOTRISONE) cream Apply 1 Application topically daily. 30 g 0   dorzolamide (TRUSOPT) 2 % ophthalmic solution Place 1 drop into the right eye in the morning and at bedtime.     losartan (COZAAR) 25 MG tablet Take 1 tablet (25 mg total) by mouth  daily. 30 tablet 6   nitroGLYCERIN (NITROSTAT) 0.4 MG SL tablet 1 TAB UNDER TONGUE EVERY 5 MINUTES X3 DOSES AS NEED CHEST PAIN. IF NO RELIEF AFTER 1ST DOSE GO TO ER 25 tablet 3   sildenafil (REVATIO) 20 MG tablet TAKE 2 TO 3 TABLETS BY MOUTH AS NEEDED 30 MINUTES BEFORE INTERCOURSE Strength: 20 mg 90 tablet 1   warfarin (JANTOVEN) 5 MG tablet Take 1 tablet (5 mg total) by mouth daily. 90 tablet 1   No current facility-administered medications for this visit.     Past Surgical History:  Procedure Laterality Date   BACK SURGERY  2009   DISCECTOMY   CATARACT EXTRACTION, BILATERAL     CERVICAL FUSION  2008   COLONOSCOPY  2005   Dr. Madilyn Fireman: normal   COLONOSCOPY  2010   Dr. Jena Gauss, +Polyps but no path available. Surveillance 5 years   COLONOSCOPY  2016   Dr. Madilyn Fireman   COLONOSCOPY WITH PROPOFOL N/A 01/15/2020   Procedure: COLONOSCOPY  WITH PROPOFOL;  Surgeon: Corbin Ade, MD;  [facility];  Service: Endoscopy;  Laterality: N/A;  10:15am   CORONARY STENT INTERVENTION N/A 04/09/2017   Procedure: CORONARY STENT INTERVENTION;  Surgeon: Yvonne Kendall, MD;  [facility];  Service: Cardiovascular;  Laterality: N/A;   CORONARY STENT INTERVENTION N/A 04/12/2017   Procedure: CORONARY STENT INTERVENTION;  Surgeon: Yvonne Kendall, MD;  [facility];  Service: Cardiovascular;  Laterality: N/A;   CORONARY ULTRASOUND/IVUS N/A 04/12/2017   Procedure: Intravascular Ultrasound/IVUS;  Surgeon: Yvonne Kendall, MD;  [facility];  Service: Cardiovascular;  Laterality: N/A;   LEFT HEART CATH AND CORONARY ANGIOGRAPHY N/A 04/09/2017   Procedure: LEFT HEART CATH AND CORONARY ANGIOGRAPHY;  Surgeon: Yvonne Kendall, MD;  [facility];  Service: Cardiovascular;  Laterality: N/A;   TOTAL HIP ARTHROPLASTY Right 12/22/2013   Procedure: RIGHT TOTAL HIP ARTHROPLASTY ANTERIOR APPROACH;  Surgeon: Shelda Pal, MD;  Location: [facility];  Service: Orthopedics;  Laterality: Right;   TOTAL HIP ARTHROPLASTY Left 04/30/2015   Procedure: LEFT TOTAL HIP ARTHROPLASTY ANTERIOR APPROACH;  Surgeon: Durene Romans, MD;  Location: [facility];  Service: Orthopedics;  Laterality: Left;     Allergies  Allergen Reactions   Atorvastatin     myalgias   Lyrica [Pregabalin]     "went out of head"   Pravastatin     myalgias      Family History  Problem Relation Age of Onset   Breast cancer Mother    Hypertension Mother    Hyperlipidemia Mother    Heart attack Mother 53   Stroke Mother    Cirrhosis Sister    Cancer Brother    Malignant hyperthermia Brother    Hypertension Paternal Grandmother    Diabetes Paternal Grandmother    Colon cancer Neg Hx      Social History Mr. Corbo reports that he has never smoked. He has never used smokeless tobacco. Mr. Maples reports no history of  alcohol use.   Review of Systems CONSTITUTIONAL: No weight loss, fever, chills, weakness or fatigue.  HEENT: Eyes: No visual loss, blurred vision, double vision or yellow sclerae.No hearing loss, sneezing, congestion, runny nose or sore throat.  SKIN: No rash or itching.  CARDIOVASCULAR: per hpi RESPIRATORY: No shortness of breath, cough or sputum.  GASTROINTESTINAL: No anorexia, nausea, vomiting or diarrhea. No abdominal pain or blood.  GENITOURINARY: No burning on urination, no polyuria NEUROLOGICAL: No headache, dizziness, syncope,  paralysis, ataxia, numbness or tingling in the extremities. No change in bowel or bladder control.  MUSCULOSKELETAL: No muscle, back pain, joint pain or stiffness.  LYMPHATICS: No enlarged nodes. No history of splenectomy.  PSYCHIATRIC: No history of depression or anxiety.  ENDOCRINOLOGIC: No reports of sweating, cold or heat intolerance. No polyuria or polydipsia.  Marland Kitchen   Physical Examination Today's Vitals   12/07/22 0857  BP: 124/70  Pulse: 70  SpO2: 100%  Weight: 178 lb (80.7 kg)  Height: 5' 7.5" (1.715 m)   Body mass index is 27.47 kg/m.  Gen: resting comfortably, no acute distress HEENT: no scleral icterus, pupils equal round and reactive, no palptable cervical adenopathy,  CV: RRR, no mrg, no jvd Resp: Clear to auscultation bilaterally GI: abdomen is soft, non-tender, non-distended, normal bowel sounds, no hepatosplenomegaly MSK: extremities are warm, no edema.  Skin: warm, no rash Neuro:  no focal deficits Psych: appropriate affect   Diagnostic Studies Jan 2019 cath Conclusions: Significant two-vessel coronary artery disease with 80-90% mid LAD stenosis and occluded mid LCx. Mild to moderate right coronary artery disease. Proximal and mid portions of the LAD and RCA are ectatic/aneurysmal. Mildly to moderately reduced left ventricular contraction with mid anterolateral hypokinesis. Low normal LVEDP. Successful PCI to mid LCx using  Resolute Onyx 2.0 x 2.0 x 22 mm DES (post-dilated to 2.6 mm proximally) with 0% residual stenosis and TIMI-3 flow.   Recommendations: Plan for staged PCI to LAD next week, as renal function allows. ASA and ticagrelor given today; recommend switching ticagrelor to clopidogrel prior to discharge. Continue warfarin, clopidogrel, and aspirin x 1 month, then discontinue aspirin and complete at least 12 months of warfarin and clopidogrel. Aggressive secondary prevention.     Jan 2019 cath staged PCI Conclusions: Aneurysmal LAD with sequential moderate to severe stenosis involving the proximal and mid portions of the vessel. Patent stent in the mid LCx. Successful IVUS-guided PCI to the mid LAD with placement of non-overlapping Resolute Onyx 3.5 x 8 mm (proximal) and Resolute Onyx 2.75 x 38 mm (distal) drug-eluting stents with 0% residual stenosis and TIMI-3 flow.   Recommendations: Transition from ticagrelor to clopidogrel; will load with clopidogrel 300 mg x 1 tomorrow morning, followed by 75 mg daily thereafter. Resume warfarin per pharmacy tonight. Patient and his family report that he has been bridged in the past for procedures requiring cessation of warfarin. If there is no evidence of bleeding or vascular injury tomorrow, Lovenox bridge should be started tomorrow morning per pharmacy. Continue aspirin, clopidogrel, and warfarin x 1 month. If INR therapeutic/stable, aspirin can be discontinued at that time. Aggressive secondary prevention.   Jan 2019 echo Study Conclusions   - Left ventricle: The cavity size was normal. Wall thickness was   normal. Systolic function was normal. The estimated ejection   fraction was in the range of 60% to 65%. Wall motion was normal;   there were no regional wall motion abnormalities. Doppler   parameters are consistent with abnormal left ventricular   relaxation (grade 1 diastolic dysfunction). The E/e&' ratio is   between 8-15, suggesting indeterminate  LV filling pressure. - Aortic valve: Trileaflet. Sclerosis without stenosis. There was   trivial regurgitation. - Mitral valve: Mildly thickened leaflets . There was trivial   regurgitation. - Left atrium: The atrium was mildly dilated. - Tricuspid valve: There was trivial regurgitation. - Pulmonary arteries: PA peak pressure: 18 mm Hg (S). - Inferior vena cava: The vessel was normal in size. The   respirophasic  diameter changes were in the normal range (>= 50%),   consistent with normal central venous pressure.   Impressions:   - LVEF 60-65%, normal wall thickness, normal wall motion, grade 1   DD, indeterminate LV filling pressure, aortic valve sclerosis   with trivial AI, trivial MR, mild LAE, trivial TR, RVSP 18 mmHg,   normal IVC.      Assessment and Plan   1. CAD -  diarrhea on beta blockers and statins -reports cough possibly due to ACEi, change to losartan 25mg  daily - no ASA since on coumadin  - no symptoms, we will continue current meds - EKG today shows NSR, no ischemic changes   2. Hyperlipidemia - did not tolerate statins due to diarrhea. Reports diarrhea on zetia as well - recently on repatha and also developed diarrhea - he is not interested in trying leqvio at this time, conitnue dietary modification.      3. HTN - cough on lisinopril, change to losartan 25mg  daily.  - bp is at goal, continue current meds  F/u  6months    Antoine Poche, M.D.

## 2022-12-07 NOTE — Patient Instructions (Signed)
Medication Instructions:  Continue all current medications.  Labwork: none  Testing/Procedures: none  Follow-Up: 6 months   Any Other Special Instructions Will Be Listed Below (If Applicable).  If you need a refill on your cardiac medications before your next appointment, please call your pharmacy.  

## 2022-12-08 DIAGNOSIS — H401212 Low-tension glaucoma, right eye, moderate stage: Secondary | ICD-10-CM | POA: Diagnosis not present

## 2022-12-15 NOTE — Telephone Encounter (Signed)
Call placed to pt, per Dr. Wyline Mood.  Pt is not having any surgery noted below.  Will route back to the requesting surgeon's office to make them aware.

## 2023-01-06 DIAGNOSIS — H401213 Low-tension glaucoma, right eye, severe stage: Secondary | ICD-10-CM | POA: Diagnosis not present

## 2023-01-06 DIAGNOSIS — H34231 Retinal artery branch occlusion, right eye: Secondary | ICD-10-CM | POA: Diagnosis not present

## 2023-01-06 DIAGNOSIS — H40022 Open angle with borderline findings, high risk, left eye: Secondary | ICD-10-CM | POA: Diagnosis not present

## 2023-01-19 ENCOUNTER — Telehealth: Payer: Self-pay

## 2023-01-19 ENCOUNTER — Other Ambulatory Visit: Payer: Self-pay | Admitting: Internal Medicine

## 2023-01-19 DIAGNOSIS — Z7901 Long term (current) use of anticoagulants: Secondary | ICD-10-CM | POA: Diagnosis not present

## 2023-01-19 NOTE — Telephone Encounter (Signed)
error 

## 2023-01-20 ENCOUNTER — Other Ambulatory Visit: Payer: Self-pay | Admitting: Internal Medicine

## 2023-01-20 DIAGNOSIS — R791 Abnormal coagulation profile: Secondary | ICD-10-CM | POA: Insufficient documentation

## 2023-01-20 LAB — PROTIME-INR
INR: 4.1 — ABNORMAL HIGH (ref 0.9–1.2)
Prothrombin Time: 41.2 s — ABNORMAL HIGH (ref 9.1–12.0)

## 2023-01-20 NOTE — Addendum Note (Signed)
Addended byTrena Platt on: 01/20/2023 08:17 AM   Modules accepted: Orders

## 2023-01-27 DIAGNOSIS — R791 Abnormal coagulation profile: Secondary | ICD-10-CM | POA: Diagnosis not present

## 2023-01-28 LAB — PROTIME-INR
INR: 1.8 — ABNORMAL HIGH (ref 0.9–1.2)
Prothrombin Time: 19 s — ABNORMAL HIGH (ref 9.1–12.0)

## 2023-01-30 ENCOUNTER — Other Ambulatory Visit: Payer: Self-pay | Admitting: Internal Medicine

## 2023-02-08 DIAGNOSIS — H40022 Open angle with borderline findings, high risk, left eye: Secondary | ICD-10-CM | POA: Diagnosis not present

## 2023-03-11 DIAGNOSIS — H401213 Low-tension glaucoma, right eye, severe stage: Secondary | ICD-10-CM | POA: Diagnosis not present

## 2023-03-15 ENCOUNTER — Other Ambulatory Visit: Payer: Self-pay

## 2023-03-15 DIAGNOSIS — R791 Abnormal coagulation profile: Secondary | ICD-10-CM | POA: Diagnosis not present

## 2023-03-16 LAB — PROTIME-INR
INR: 3.1 — ABNORMAL HIGH (ref 0.9–1.2)
Prothrombin Time: 31.7 s — ABNORMAL HIGH (ref 9.1–12.0)

## 2023-04-16 ENCOUNTER — Encounter: Payer: Self-pay | Admitting: Internal Medicine

## 2023-04-16 ENCOUNTER — Ambulatory Visit: Payer: BC Managed Care – PPO | Admitting: Internal Medicine

## 2023-04-16 VITALS — BP 122/82 | HR 70 | Ht 67.5 in | Wt 175.8 lb

## 2023-04-16 DIAGNOSIS — R7303 Prediabetes: Secondary | ICD-10-CM

## 2023-04-16 DIAGNOSIS — Z86711 Personal history of pulmonary embolism: Secondary | ICD-10-CM | POA: Diagnosis not present

## 2023-04-16 DIAGNOSIS — E559 Vitamin D deficiency, unspecified: Secondary | ICD-10-CM | POA: Diagnosis not present

## 2023-04-16 DIAGNOSIS — Z7901 Long term (current) use of anticoagulants: Secondary | ICD-10-CM

## 2023-04-16 DIAGNOSIS — I10 Essential (primary) hypertension: Secondary | ICD-10-CM

## 2023-04-16 DIAGNOSIS — I2510 Atherosclerotic heart disease of native coronary artery without angina pectoris: Secondary | ICD-10-CM | POA: Diagnosis not present

## 2023-04-16 DIAGNOSIS — Z125 Encounter for screening for malignant neoplasm of prostate: Secondary | ICD-10-CM

## 2023-04-16 DIAGNOSIS — Z0001 Encounter for general adult medical examination with abnormal findings: Secondary | ICD-10-CM

## 2023-04-16 DIAGNOSIS — R252 Cramp and spasm: Secondary | ICD-10-CM | POA: Insufficient documentation

## 2023-04-16 DIAGNOSIS — Z9861 Coronary angioplasty status: Secondary | ICD-10-CM | POA: Diagnosis not present

## 2023-04-16 DIAGNOSIS — Z789 Other specified health status: Secondary | ICD-10-CM

## 2023-04-16 MED ORDER — LOSARTAN POTASSIUM 25 MG PO TABS
25.0000 mg | ORAL_TABLET | Freq: Every day | ORAL | 3 refills | Status: AC
Start: 2023-04-16 — End: ?

## 2023-04-16 NOTE — Assessment & Plan Note (Signed)
Physical exam as documented. Fasting blood tests today. Advised to get Tdap vaccine at local pharmacy.

## 2023-04-16 NOTE — Patient Instructions (Signed)
Please continue to take medications as prescribed.  Please continue to follow low carb diet and perform moderate exercise/walking as tolerated.

## 2023-04-16 NOTE — Assessment & Plan Note (Signed)
Takes Coumadin for DVT/PE Check INR

## 2023-04-16 NOTE — Assessment & Plan Note (Signed)
Could be related to overexertion and/or electrolyte imbalance Check CMP, TSH Can take mustard or pickle as needed as muscle cramps are intermittent

## 2023-04-16 NOTE — Assessment & Plan Note (Signed)
Ordered PSA after discussing its limitations for prostate cancer screening, including false positive results leading to additional investigations.

## 2023-04-16 NOTE — Assessment & Plan Note (Signed)
On Coumadin due to history of DVT and PE Did not tolerate statin in the past Did not tolerate Repatha as well Followed by Cardiology

## 2023-04-16 NOTE — Assessment & Plan Note (Signed)
Has had severe GI discomfort and myalgias with statin in the past

## 2023-04-16 NOTE — Progress Notes (Signed)
Established Patient Office Visit  Subjective:  Patient ID: [name]., male    DOB: November 17, 1945  Age: 78 y.o. MRN: [id number]  CC:  Chief Complaint  Patient presents with   Annual Exam    Cpe , reports cramps in his arms , legs, and hands.     HPI [name]. is a 78 y.o. male with past medical history of CAD s/p stent placement, DVT/PE in 2009 -on Coumadin, erectile dysfunction, avascular necrosis of hip s/p THA, HLD and colonic polyps who presents for f/u of his chronic medical conditions.  HTN: BP is well-controlled. Takes medications regularly.  He was  placed on losartan instead of lisinopril due to cough.  Patient denies headache, dizziness, chest pain, dyspnea or palpitations.  HLD: His Zetia was discontinued due to diarrhea. He was placed on Repatha, but had severe diarrhea and GI discomfort with it.  He has statin intolerance.  He had DVT/PE in 2009, and has been taking Coumadin since then. He still wants to continue taking Coumadin for now.  He does have easy bruising.  Denies any active bleeding currently.  He gets INR checked every month or so.  He had supratherapeutic INR - 4.1 in 10/24. He reports that he was taking Tylenol on a daily basis for about a week before the blood test for joint pains.  She reports chronic, intermittent cramps in arms and legs.  He has tried mustard with adequate relief.     Past Medical History:  Diagnosis Date   Anticoagulated on Coumadin    Avascular necrosis of hip (HCC)    LEFT   DVT (deep venous thrombosis) (HCC) 2009   Dyslipidemia, goal LDL below 70 04/09/2017   Family history of anesthesia complication    BROTHER HAD MALIGNANT HYPOTHERMIA - PT HAS NOT HAD ANY PROBLEMS WITH ANESTHESIA     History of kidney stones    Malignant hyperthermia    PT'S BROTHER HAS HX MALIGNANT HYPERTHERMIA   NSTEMI (non-ST elevated myocardial infarction) (HCC) 04/09/2017   Pulmonary embolism (HCC) 2009   Vertigo    NO RECENT PROBLEMS     Past Surgical History:  Procedure Laterality Date   BACK SURGERY  2009   DISCECTOMY   CATARACT EXTRACTION, BILATERAL     CERVICAL FUSION  2008   COLONOSCOPY  2005   Dr. Madilyn Fireman: normal   COLONOSCOPY  2010   Dr. Jena Gauss, +Polyps but no path available. Surveillance 5 years   COLONOSCOPY  2016   Dr. Madilyn Fireman   COLONOSCOPY WITH PROPOFOL N/A 01/15/2020   Procedure: COLONOSCOPY WITH PROPOFOL;  Surgeon: Corbin Ade, MD;  [facility]: [facility];  Service: Endoscopy;  Laterality: N/A;  10:15am   CORONARY STENT INTERVENTION N/A 04/09/2017   Procedure: CORONARY STENT INTERVENTION;  Surgeon: Yvonne Kendall, MD;  [facility]: [facility];  Service: Cardiovascular;  Laterality: N/A;   CORONARY STENT INTERVENTION N/A 04/12/2017   Procedure: CORONARY STENT INTERVENTION;  Surgeon: Yvonne Kendall, MD;  [facility]: [facility];  Service: Cardiovascular;  Laterality: N/A;   CORONARY ULTRASOUND/IVUS N/A 04/12/2017   Procedure: Intravascular Ultrasound/IVUS;  Surgeon: Yvonne Kendall, MD;  [facility]: [facility];  Service: Cardiovascular;  Laterality: N/A;   LEFT HEART CATH AND CORONARY ANGIOGRAPHY N/A 04/09/2017   Procedure: LEFT HEART CATH AND CORONARY ANGIOGRAPHY;  Surgeon: Yvonne Kendall, MD;  [facility]: [facility];  Service: Cardiovascular;  Laterality: N/A;   TOTAL HIP ARTHROPLASTY Right 12/22/2013   Procedure:  RIGHT TOTAL HIP ARTHROPLASTY ANTERIOR APPROACH;  Surgeon: Shelda Pal, MD;  Location: [facility];  Service: Orthopedics;  Laterality: Right;   TOTAL HIP ARTHROPLASTY Left 04/30/2015   Procedure: LEFT TOTAL HIP ARTHROPLASTY ANTERIOR APPROACH;  Surgeon: Durene Romans, MD;  Location: [facility];  Service: Orthopedics;  Laterality: Left;    Family History  Problem Relation Age of Onset   Breast cancer Mother    Hypertension Mother    Hyperlipidemia Mother    Heart attack Mother 2   Stroke Mother    Cirrhosis Sister    Cancer Brother    Malignant hyperthermia  Brother    Hypertension Paternal Grandmother    Diabetes Paternal Grandmother    Colon cancer Neg Hx     Social History   Socioeconomic History   Marital status: Married    Spouse name: Not on file   Number of children: Not on file   Years of education: Not on file   Highest education level: Not on file  Occupational History   Occupation: self employed, Fish farm manager  Tobacco Use   Smoking status: Never   Smokeless tobacco: Never  Vaping Use   Vaping status: Never Used  Substance and Sexual Activity   Alcohol use: No   Drug use: No   Sexual activity: Not on file  Other Topics Concern   Not on file  Social History Narrative   Right handed    Retired but still working    Social Drivers of Corporate investment banker Strain: Not on Ship broker Insecurity: Not on file  Transportation Needs: Not on file  Physical Activity: Not on file  Stress: Not on file  Social Connections: Not on file  Intimate Partner Violence: Not on file    Outpatient Medications Prior to Visit  Medication Sig Dispense Refill   acetaminophen (TYLENOL) 500 MG tablet Take 2 tablets (1,000 mg total) by mouth every 8 (eight) hours. (Patient taking differently: Take 1,000 mg by mouth every 8 (eight) hours as needed (pain/headaches.).) 30 tablet 0   clotrimazole-betamethasone (LOTRISONE) cream Apply 1 Application topically daily. 30 g 0   dorzolamide (TRUSOPT) 2 % ophthalmic solution Place 1 drop into the right eye in the morning and at bedtime.     JANTOVEN 5 MG tablet TAKE 1 TABLET BY MOUTH DAILY. 90 tablet 1   nitroGLYCERIN (NITROSTAT) 0.4 MG SL tablet 1 TAB UNDER TONGUE EVERY 5 MINUTES X3 DOSES AS NEED CHEST PAIN. IF NO RELIEF AFTER 1ST DOSE GO TO ER 25 tablet 3   sildenafil (REVATIO) 20 MG tablet TAKE 2 TO 3 TABLETS BY MOUTH AS NEEDED 30 MINUTES BEFORE INTERCOURSE Strength: 20 mg 90 tablet 1   losartan (COZAAR) 25 MG tablet Take 1 tablet (25 mg total) by mouth daily. 30 tablet 6   No  facility-administered medications prior to visit.    Allergies  Allergen Reactions   Atorvastatin     myalgias   Lyrica [Pregabalin]     "went out of head"   Pravastatin     myalgias    ROS Review of Systems  Constitutional:  Negative for chills and fever.  HENT:  Negative for congestion and sore throat.   Eyes:  Negative for pain and discharge.  Respiratory:  Negative for cough and shortness of breath.   Cardiovascular:  Negative for chest pain and palpitations.  Gastrointestinal:  Negative for constipation, diarrhea, nausea and vomiting.  Endocrine: Negative for polydipsia and polyuria.  Genitourinary:  Negative  for dysuria and hematuria.  Musculoskeletal:  Negative for neck pain and neck stiffness.  Skin:  Negative for rash.  Neurological:  Negative for dizziness, weakness, numbness and headaches.  Hematological:  Bruises/bleeds easily.  Psychiatric/Behavioral:  Negative for agitation and behavioral problems.       Objective:    Physical Exam Vitals reviewed.  Constitutional:      General: He is not in acute distress.    Appearance: He is not diaphoretic.  HENT:     Head: Normocephalic and atraumatic.     Nose: No congestion.     Mouth/Throat:     Mouth: Mucous membranes are moist.  Eyes:     General: No scleral icterus.    Extraocular Movements: Extraocular movements intact.  Cardiovascular:     Rate and Rhythm: Normal rate and regular rhythm.     Pulses: Normal pulses.     Heart sounds: Normal heart sounds. No murmur heard. Pulmonary:     Breath sounds: Normal breath sounds. No wheezing or rales.  Abdominal:     Palpations: Abdomen is soft.     Tenderness: There is no abdominal tenderness.  Musculoskeletal:     Cervical back: Neck supple. No tenderness.     Right lower leg: No edema.     Left lower leg: No edema.  Skin:    General: Skin is warm.     Findings: No rash.  Neurological:     General: No focal deficit present.     Mental Status: He is  alert and oriented to person, place, and time.     Cranial Nerves: No cranial nerve deficit.     Sensory: No sensory deficit.     Motor: No weakness.  Psychiatric:        Mood and Affect: Mood normal.        Behavior: Behavior normal.     BP 122/82 (BP Location: Left Arm)   Pulse 70   Ht 5' 7.5" (1.715 m)   Wt 175 lb 12.8 oz (79.7 kg)   SpO2 95%   BMI 27.13 kg/m  Wt Readings from Last 3 Encounters:  04/16/23 175 lb 12.8 oz (79.7 kg)  12/07/22 178 lb (80.7 kg)  10/15/22 174 lb 12.8 oz (79.3 kg)    Lab Results  Component Value Date   TSH 2.350 04/30/2022   Lab Results  Component Value Date   WBC 5.5 10/15/2022   HGB 13.2 10/15/2022   HCT 39.2 10/15/2022   MCV 86 10/15/2022   PLT 217 10/15/2022   Lab Results  Component Value Date   NA 141 10/15/2022   K 3.9 10/15/2022   CO2 24 10/15/2022   GLUCOSE 89 10/15/2022   BUN 16 10/15/2022   CREATININE 1.10 10/15/2022   BILITOT 0.6 10/15/2022   ALKPHOS 74 10/15/2022   AST 26 10/15/2022   ALT 29 10/15/2022   PROT 6.7 10/15/2022   ALBUMIN 4.3 10/15/2022   CALCIUM 8.7 10/15/2022   ANIONGAP 10 06/25/2022   EGFR 70 10/15/2022   Lab Results  Component Value Date   CHOL 192 04/30/2022   Lab Results  Component Value Date   HDL 38 (L) 04/30/2022   Lab Results  Component Value Date   LDLCALC 129 (H) 04/30/2022   Lab Results  Component Value Date   TRIG 137 04/30/2022   Lab Results  Component Value Date   CHOLHDL 5.1 (H) 04/30/2022   Lab Results  Component Value Date   HGBA1C 6.0 (H) 10/15/2022  Assessment & Plan:   Problem List Items Addressed This Visit       Cardiovascular and Mediastinum   CAD S/P percutaneous coronary angioplasty   On Coumadin due to history of DVT and PE Did not tolerate statin in the past Did not tolerate Repatha as well Followed by Cardiology      Relevant Medications   losartan (COZAAR) 25 MG tablet   Other Relevant Orders   Lipid panel   Essential hypertension    BP Readings from Last 1 Encounters:  04/16/23 122/82   Well-controlled with Losartan 25 mg QD Counseled for compliance with the medications Advised DASH diet and moderate exercise/walking, at least 150 mins/week      Relevant Medications   losartan (COZAAR) 25 MG tablet   Other Relevant Orders   TSH   CMP14+EGFR   CBC with Differential/Platelet     Other   History of pulmonary embolism - Primary (Chronic)   In 2009 Unclear if provoked or unprovoked, but has been taking Coumadin since 2009 Had limited mobility in 2009 due to severe back pain, which could be provoking factor. Has had coagulation work-up in the past, had Hematology evaluation in the past as well, recommended indefinite AC - continue Coumadin 5 mg QD Will continue to check INR with goal between 2-3.      Long term (current) use of anticoagulants   Takes Coumadin for DVT/PE Check INR      Relevant Orders   CBC with Differential/Platelet   INR/PT   Encounter for general adult medical examination with abnormal findings   Physical exam as documented. Fasting blood tests today. Advised to get Tdap vaccine at local pharmacy.      Statin intolerance   Has had severe GI discomfort and myalgias with statin in the past      Prostate cancer screening   Ordered PSA after discussing its limitations for prostate cancer screening, including false positive results leading to additional investigations.      Relevant Orders   PSA   Prediabetes   Lab Results  Component Value Date   HGBA1C 6.0 (H) 10/15/2022   Advised to follow low carb diet      Relevant Orders   Hemoglobin A1c   CMP14+EGFR   Muscle cramps   Could be related to overexertion and/or electrolyte imbalance Check CMP, TSH Can take mustard or pickle as needed as muscle cramps are intermittent      Other Visit Diagnoses       Vitamin D deficiency       Relevant Orders   VITAMIN D 25 Hydroxy (Vit-D Deficiency, Fractures)         Meds  ordered this encounter  Medications   losartan (COZAAR) 25 MG tablet    Sig: Take 1 tablet (25 mg total) by mouth daily.    Dispense:  90 tablet    Refill:  3    Follow-up: Return in about 6 months (around 10/14/2023) for HTN and h/o PE.    Anabel Halon, MD

## 2023-04-16 NOTE — Assessment & Plan Note (Signed)
BP Readings from Last 1 Encounters:  04/16/23 122/82   Well-controlled with Losartan 25 mg QD Counseled for compliance with the medications Advised DASH diet and moderate exercise/walking, at least 150 mins/week

## 2023-04-16 NOTE — Assessment & Plan Note (Addendum)
In 2009 Unclear if provoked or unprovoked, but has been taking Coumadin since 2009 Had limited mobility in 2009 due to severe back pain, which could be provoking factor. Has had coagulation work-up in the past, had Hematology evaluation in the past as well, recommended indefinite AC - continue Coumadin 5 mg QD Will continue to check INR with goal between 2-3.

## 2023-04-16 NOTE — Assessment & Plan Note (Signed)
Lab Results  Component Value Date   HGBA1C 6.0 (H) 10/15/2022   Advised to follow low carb diet

## 2023-04-17 LAB — CBC WITH DIFFERENTIAL/PLATELET
Basophils Absolute: 0 10*3/uL (ref 0.0–0.2)
Basos: 1 %
EOS (ABSOLUTE): 0.2 10*3/uL (ref 0.0–0.4)
Eos: 4 %
Hematocrit: 42.1 % (ref 37.5–51.0)
Hemoglobin: 13.7 g/dL (ref 13.0–17.7)
Immature Grans (Abs): 0 10*3/uL (ref 0.0–0.1)
Immature Granulocytes: 0 %
Lymphocytes Absolute: 1.4 10*3/uL (ref 0.7–3.1)
Lymphs: 29 %
MCH: 29.5 pg (ref 26.6–33.0)
MCHC: 32.5 g/dL (ref 31.5–35.7)
MCV: 91 fL (ref 79–97)
Monocytes Absolute: 0.6 10*3/uL (ref 0.1–0.9)
Monocytes: 12 %
Neutrophils Absolute: 2.6 10*3/uL (ref 1.4–7.0)
Neutrophils: 54 %
Platelets: 215 10*3/uL (ref 150–450)
RBC: 4.64 x10E6/uL (ref 4.14–5.80)
RDW: 12.5 % (ref 11.6–15.4)
WBC: 4.8 10*3/uL (ref 3.4–10.8)

## 2023-04-17 LAB — CMP14+EGFR
ALT: 21 [IU]/L (ref 0–44)
AST: 23 [IU]/L (ref 0–40)
Albumin: 4.3 g/dL (ref 3.8–4.8)
Alkaline Phosphatase: 75 [IU]/L (ref 44–121)
BUN/Creatinine Ratio: 10 (ref 10–24)
BUN: 14 mg/dL (ref 8–27)
Bilirubin Total: 0.6 mg/dL (ref 0.0–1.2)
CO2: 25 mmol/L (ref 20–29)
Calcium: 8.9 mg/dL (ref 8.6–10.2)
Chloride: 104 mmol/L (ref 96–106)
Creatinine, Ser: 1.39 mg/dL — ABNORMAL HIGH (ref 0.76–1.27)
Globulin, Total: 2.4 g/dL (ref 1.5–4.5)
Glucose: 106 mg/dL — ABNORMAL HIGH (ref 70–99)
Potassium: 4 mmol/L (ref 3.5–5.2)
Sodium: 142 mmol/L (ref 134–144)
Total Protein: 6.7 g/dL (ref 6.0–8.5)
eGFR: 53 mL/min/{1.73_m2} — ABNORMAL LOW (ref >59)

## 2023-04-17 LAB — VITAMIN D 25 HYDROXY (VIT D DEFICIENCY, FRACTURES): Vit D, 25-Hydroxy: 32.8 ng/mL (ref 30.0–100.0)

## 2023-04-17 LAB — HEMOGLOBIN A1C
Est. average glucose Bld gHb Est-mCnc: 123 mg/dL
Hgb A1c MFr Bld: 5.9 % — ABNORMAL HIGH (ref 4.8–5.6)

## 2023-04-17 LAB — LIPID PANEL
Chol/HDL Ratio: 5.4 {ratio} — ABNORMAL HIGH (ref 0.0–5.0)
Cholesterol, Total: 177 mg/dL (ref 100–199)
HDL: 33 mg/dL — ABNORMAL LOW (ref >39)
LDL Chol Calc (NIH): 125 mg/dL — ABNORMAL HIGH (ref 0–99)
Triglycerides: 105 mg/dL (ref 0–149)
VLDL Cholesterol Cal: 19 mg/dL (ref 5–40)

## 2023-04-17 LAB — PSA: Prostate Specific Ag, Serum: 1.9 ng/mL (ref 0.0–4.0)

## 2023-04-17 LAB — PROTIME-INR
INR: 2.8 — ABNORMAL HIGH (ref 0.9–1.2)
Prothrombin Time: 28.7 s — ABNORMAL HIGH (ref 9.1–12.0)

## 2023-04-17 LAB — TSH: TSH: 3.14 u[IU]/mL (ref 0.450–4.500)

## 2023-05-25 ENCOUNTER — Ambulatory Visit: Payer: BC Managed Care – PPO | Admitting: Cardiology

## 2023-06-03 ENCOUNTER — Other Ambulatory Visit: Payer: Self-pay

## 2023-06-03 DIAGNOSIS — R791 Abnormal coagulation profile: Secondary | ICD-10-CM | POA: Diagnosis not present

## 2023-06-04 LAB — PROTIME-INR
INR: 3 — ABNORMAL HIGH (ref 0.9–1.2)
Prothrombin Time: 30.5 s — ABNORMAL HIGH (ref 9.1–12.0)

## 2023-06-16 ENCOUNTER — Ambulatory Visit: Payer: BC Managed Care – PPO | Admitting: Cardiology

## 2023-07-02 DIAGNOSIS — H401212 Low-tension glaucoma, right eye, moderate stage: Secondary | ICD-10-CM | POA: Diagnosis not present

## 2023-07-14 DIAGNOSIS — H0279 Other degenerative disorders of eyelid and periocular area: Secondary | ICD-10-CM | POA: Diagnosis not present

## 2023-07-14 DIAGNOSIS — D485 Neoplasm of uncertain behavior of skin: Secondary | ICD-10-CM | POA: Diagnosis not present

## 2023-07-20 ENCOUNTER — Other Ambulatory Visit: Payer: Self-pay | Admitting: Internal Medicine

## 2023-07-26 ENCOUNTER — Ambulatory Visit: Attending: Cardiology | Admitting: Cardiology

## 2023-07-26 ENCOUNTER — Encounter: Payer: Self-pay | Admitting: Cardiology

## 2023-07-26 VITALS — BP 124/70 | HR 66 | Ht 67.5 in | Wt 176.6 lb

## 2023-07-26 DIAGNOSIS — I10 Essential (primary) hypertension: Secondary | ICD-10-CM | POA: Diagnosis not present

## 2023-07-26 DIAGNOSIS — I2510 Atherosclerotic heart disease of native coronary artery without angina pectoris: Secondary | ICD-10-CM | POA: Diagnosis not present

## 2023-07-26 DIAGNOSIS — E782 Mixed hyperlipidemia: Secondary | ICD-10-CM | POA: Diagnosis not present

## 2023-07-26 NOTE — Progress Notes (Signed)
 Clinical Summary [name] is a 78 y.o.male seen today for follow up of the following medical problems.      1. CAD - admit Jan 2019 with subcascpular pain, found to have NSTEMI peak trop 31 - cath Jan 2019 staged PCI LCX and LAD.  - plans were for triple therapy with asa, plavix , coumdin (for history of PE) x 1 month, then plavix  and coumadin  - Jan 2019 echo LVEF 60-65%, no wma's, grade I diatolic dysfunction.    - previously came off beta blocker and statin due to diarrhea. He tried taking pravastatin  low dose and did so for a few months but had recurrent diarrhea and ended up stopping     - no chest pains, no SOB/DOE - compliant with meds   2. History of bilateral PE -  occurred after immobility after back surgery in 10/2017 - 09/2013 LE US  showed chronic DVT left femoral vein - 06/2017 CT PE no PE - 06/2017 LE venous US : left popliteal vein DVT new compared to 09/2013 study. Chronic DVT left femoral and tibial veins. Chronic DVT left deep femoral vein.   - on life long coumadin  from notes, notes indicate this was recommended by hematology in the past - seen again by hematology, decision made to continue anticoagulation.     - denies any bleeding on coumadin .       3. Hyperlipidemia - 11/2018 TC 187 TG 110 HDL 42 LDL 125 - intolerant to statins   - last visit 07/2019 was to start zetia  10mg  daily.  - 12/2019 TC 144 TG 134 HDL 32 LDL 88   - reports zetia  caused diarrhea. Stopped taking. - seen in lipid clinic, was to try repatha  - diarrhea on repatha , has stopped taking  - has not been intersted in leqvio  Jan 2025 TC 177 TG 105 HDL 33 LDL 125   4. 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     Past Medical History:  Diagnosis Date   Anticoagulated on Coumadin     Avascular necrosis of hip (HCC)    LEFT   DVT (deep venous thrombosis) (HCC) 2009   Dyslipidemia, goal LDL below  70 04/09/2017   Family history of anesthesia complication    BROTHER HAD MALIGNANT HYPOTHERMIA - PT HAS NOT HAD ANY PROBLEMS WITH ANESTHESIA     History of kidney stones    Malignant hyperthermia    PT'S BROTHER HAS HX MALIGNANT HYPERTHERMIA   NSTEMI (non-ST elevated myocardial infarction) (HCC) 04/09/2017   Pulmonary embolism (HCC) 2009   Vertigo    NO RECENT PROBLEMS     Allergies  Allergen Reactions   Atorvastatin      myalgias   Lyrica [Pregabalin]     "went out of head"   Pravastatin      myalgias     Current Outpatient Medications  Medication Sig Dispense Refill   acetaminophen  (TYLENOL ) 500 MG tablet Take 2 tablets (1,000 mg total) by mouth every 8 (eight) hours. (Patient taking differently: Take 1,000 mg by mouth every 8 (eight) hours as needed (pain/headaches.).) 30 tablet 0   clotrimazole -betamethasone  (LOTRISONE ) cream Apply 1 Application topically daily. 30 g 0   dorzolamide (TRUSOPT) 2 % ophthalmic solution Place 1 drop into the right eye in the morning and at bedtime.     JANTOVEN  5 MG tablet TAKE ONE TABLET BY MOUTH DAILY  90 tablet 1   losartan  (COZAAR ) 25 MG tablet Take 1 tablet (25 mg total) by mouth daily. 90 tablet 3   nitroGLYCERIN  (NITROSTAT ) 0.4 MG SL tablet 1 TAB UNDER TONGUE EVERY 5 MINUTES X3 DOSES AS NEED CHEST PAIN. IF NO RELIEF AFTER 1ST DOSE GO TO ER 25 tablet 3   sildenafil  (REVATIO ) 20 MG tablet TAKE 2 TO 3 TABLETS BY MOUTH AS NEEDED 30 MINUTES BEFORE INTERCOURSE Strength: 20 mg 90 tablet 1   No current facility-administered medications for this visit.     Past Surgical History:  Procedure Laterality Date   BACK SURGERY  2009   DISCECTOMY   CATARACT EXTRACTION, BILATERAL     CERVICAL FUSION  2008   COLONOSCOPY  2005   Dr. Sabra Cramp: normal   COLONOSCOPY  2010   Dr. Riley Cheadle, +Polyps but no path available. Surveillance 5 years   COLONOSCOPY  2016   Dr. Sabra Cramp   COLONOSCOPY WITH PROPOFOL  N/A 01/15/2020   Procedure: COLONOSCOPY WITH PROPOFOL ;   Surgeon: Suzette Espy, MD;  [facility];  Service: Endoscopy;  Laterality: N/A;  10:15am   CORONARY STENT INTERVENTION N/A 04/09/2017   Procedure: CORONARY STENT INTERVENTION;  Surgeon: Sammy Crisp, MD;  [facility];  Service: Cardiovascular;  Laterality: N/A;   CORONARY STENT INTERVENTION N/A 04/12/2017   Procedure: CORONARY STENT INTERVENTION;  Surgeon: Sammy Crisp, MD;  [facility];  Service: Cardiovascular;  Laterality: N/A;   CORONARY ULTRASOUND/IVUS N/A 04/12/2017   Procedure: Intravascular Ultrasound/IVUS;  Surgeon: Sammy Crisp, MD;  [facility];  Service: Cardiovascular;  Laterality: N/A;   LEFT HEART CATH AND CORONARY ANGIOGRAPHY N/A 04/09/2017   Procedure: LEFT HEART CATH AND CORONARY ANGIOGRAPHY;  Surgeon: Sammy Crisp, MD;  [facility];  Service: Cardiovascular;  Laterality: N/A;   TOTAL HIP ARTHROPLASTY Right 12/22/2013   Procedure: RIGHT TOTAL HIP ARTHROPLASTY ANTERIOR APPROACH;  Surgeon: Bevin Bucks, MD;  Location: [facility];  Service: Orthopedics;  Laterality: Right;   TOTAL HIP ARTHROPLASTY Left 04/30/2015   Procedure: LEFT TOTAL HIP ARTHROPLASTY ANTERIOR APPROACH;  Surgeon: Claiborne Crew, MD;  Location: [facility];  Service: Orthopedics;  Laterality: Left;     Allergies  Allergen Reactions   Atorvastatin      myalgias   Lyrica [Pregabalin]     "went out of head"   Pravastatin      myalgias      Family History  Problem Relation Age of Onset   Breast cancer Mother    Hypertension Mother    Hyperlipidemia Mother    Heart attack Mother 74   Stroke Mother    Cirrhosis Sister    Cancer Brother    Malignant hyperthermia Brother    Hypertension Paternal Grandmother    Diabetes Paternal Grandmother    Colon cancer Neg Hx      Social History [name] reports that he has never smoked. He has never used smokeless tobacco. [name] reports no history of alcohol  use.    Physical Examination Today's Vitals   07/26/23 1124 07/26/23 1207  BP: 137/74 124/70  Pulse: 66   SpO2: 97%   Weight: 176 lb 9.6 oz (80.1 kg)   Height: 5' 7.5" (1.715 m)    Body mass index is 27.25 kg/m.  Gen: resting comfortably, no acute distress HEENT: no scleral icterus, pupils equal round and reactive, no palptable cervical adenopathy,  CV: RRR, no m/rg, no jvd Resp: Clear to auscultation bilaterally  GI: abdomen is soft, non-tender, non-distended, normal bowel sounds, no hepatosplenomegaly MSK: extremities are warm, no edema.  Skin: warm, no rash Neuro:  no focal deficits Psych: appropriate affect   Diagnostic Studies  Jan 2019 cath Conclusions: Significant two-vessel coronary artery disease with 80-90% mid LAD stenosis and occluded mid LCx. Mild to moderate right coronary artery disease. Proximal and mid portions of the LAD and RCA are ectatic/aneurysmal. Mildly to moderately reduced left ventricular contraction with mid anterolateral hypokinesis. Low normal LVEDP. Successful PCI to mid LCx using Resolute Onyx 2.0 x 2.0 x 22 mm DES (post-dilated to 2.6 mm proximally) with 0% residual stenosis and TIMI-3 flow.   Recommendations: Plan for staged PCI to LAD next week, as renal function allows. ASA and ticagrelor  given today; recommend switching ticagrelor  to clopidogrel  prior to discharge. Continue warfarin, clopidogrel , and aspirin  x 1 month, then discontinue aspirin  and complete at least 12 months of warfarin and clopidogrel . Aggressive secondary prevention.     Jan 2019 cath staged PCI Conclusions: Aneurysmal LAD with sequential moderate to severe stenosis involving the proximal and mid portions of the vessel. Patent stent in the mid LCx. Successful IVUS-guided PCI to the mid LAD with placement of non-overlapping Resolute Onyx 3.5 x 8 mm (proximal) and Resolute Onyx 2.75 x 38 mm (distal) drug-eluting stents with 0% residual stenosis and TIMI-3 flow.    Recommendations: Transition from ticagrelor  to clopidogrel ; will load with clopidogrel  300 mg x 1 tomorrow morning, followed by 75 mg daily thereafter. Resume warfarin per pharmacy tonight. Patient and his family report that he has been bridged in the past for procedures requiring cessation of warfarin. If there is no evidence of bleeding or vascular injury tomorrow, Lovenox  bridge should be started tomorrow morning per pharmacy. Continue aspirin , clopidogrel , and warfarin x 1 month. If INR therapeutic/stable, aspirin  can be discontinued at that time. Aggressive secondary prevention.   Jan 2019 echo Study Conclusions   - Left ventricle: The cavity size was normal. Wall thickness was   normal. Systolic function was normal. The estimated ejection   fraction was in the range of 60% to 65%. Wall motion was normal;   there were no regional wall motion abnormalities. Doppler   parameters are consistent with abnormal left ventricular   relaxation (grade 1 diastolic dysfunction). The E/e&' ratio is   between 8-15, suggesting indeterminate LV filling pressure. - Aortic valve: Trileaflet. Sclerosis without stenosis. There was   trivial regurgitation. - Mitral valve: Mildly thickened leaflets . There was trivial   regurgitation. - Left atrium: The atrium was mildly dilated. - Tricuspid valve: There was trivial regurgitation. - Pulmonary arteries: PA peak pressure: 18 mm Hg (S). - Inferior vena cava: The vessel was normal in size. The   respirophasic diameter changes were in the normal range (>= 50%),   consistent with normal central venous pressure.   Impressions:   - LVEF 60-65%, normal wall thickness, normal wall motion, grade 1   DD, indeterminate LV filling pressure, aortic valve sclerosis   with trivial AI, trivial MR, mild LAE, trivial TR, RVSP 18 mmHg,   normal IVC.           Assessment and Plan   1. CAD -  diarrhea on beta blockers and statins -reports cough possibly due to  ACEi, change to losartan  - no ASA since on coumadin    - no symptoms, continue current therapy.    2. Hyperlipidemia - did not tolerate statins due to diarrhea. Reports diarrhea on zetia  as well -  recently on repatha  and also developed diarrhea - he is not interested in trying leqvio - continue dietary and lifestyle modification     3. HTN - at goal, continue current meds  F/u 1 year   Laurann Pollock, M.D.

## 2023-07-26 NOTE — Patient Instructions (Addendum)

## 2023-08-18 ENCOUNTER — Other Ambulatory Visit: Payer: Self-pay

## 2023-08-18 DIAGNOSIS — R791 Abnormal coagulation profile: Secondary | ICD-10-CM | POA: Diagnosis not present

## 2023-08-19 ENCOUNTER — Ambulatory Visit: Payer: Self-pay | Admitting: Internal Medicine

## 2023-08-19 LAB — PROTIME-INR
INR: 3.3 — ABNORMAL HIGH (ref 0.9–1.2)
Prothrombin Time: 33.7 s — ABNORMAL HIGH (ref 9.1–12.0)

## 2023-09-06 ENCOUNTER — Ambulatory Visit: Admitting: Cardiology

## 2023-09-08 ENCOUNTER — Telehealth: Payer: Self-pay

## 2023-09-08 NOTE — Telephone Encounter (Signed)
 Copied from CRM [id number]. Topic: Clinical - Medical Advice >> Sep 08, 2023 10:04 AM [employer] wrote: Reason for CRM: Patient would like to know from Dr. Lydia Sams if he should stop taking blood thinners before he gets his mole removed from his eye on 09/28/23  Callback #: [phone]

## 2023-09-08 NOTE — Telephone Encounter (Signed)
 Patient advised.

## 2023-09-28 DIAGNOSIS — H0279 Other degenerative disorders of eyelid and periocular area: Secondary | ICD-10-CM | POA: Diagnosis not present

## 2023-09-28 DIAGNOSIS — L820 Inflamed seborrheic keratosis: Secondary | ICD-10-CM | POA: Diagnosis not present

## 2023-09-28 DIAGNOSIS — D485 Neoplasm of uncertain behavior of skin: Secondary | ICD-10-CM | POA: Diagnosis not present

## 2023-10-04 ENCOUNTER — Other Ambulatory Visit: Payer: Self-pay

## 2023-10-04 DIAGNOSIS — R791 Abnormal coagulation profile: Secondary | ICD-10-CM

## 2023-10-05 LAB — PROTIME-INR
INR: 2.5 — ABNORMAL HIGH (ref 0.9–1.2)
Prothrombin Time: 25.7 s — ABNORMAL HIGH (ref 9.1–12.0)

## 2023-10-08 ENCOUNTER — Ambulatory Visit: Payer: BC Managed Care – PPO | Admitting: Internal Medicine

## 2023-10-08 ENCOUNTER — Encounter: Payer: Self-pay | Admitting: Internal Medicine

## 2023-10-08 VITALS — BP 124/65 | HR 70 | Ht 68.0 in | Wt 176.6 lb

## 2023-10-08 DIAGNOSIS — Z86711 Personal history of pulmonary embolism: Secondary | ICD-10-CM | POA: Diagnosis not present

## 2023-10-08 DIAGNOSIS — I2510 Atherosclerotic heart disease of native coronary artery without angina pectoris: Secondary | ICD-10-CM | POA: Diagnosis not present

## 2023-10-08 DIAGNOSIS — R7303 Prediabetes: Secondary | ICD-10-CM

## 2023-10-08 DIAGNOSIS — I10 Essential (primary) hypertension: Secondary | ICD-10-CM | POA: Diagnosis not present

## 2023-10-08 DIAGNOSIS — B354 Tinea corporis: Secondary | ICD-10-CM | POA: Diagnosis not present

## 2023-10-08 DIAGNOSIS — Z9861 Coronary angioplasty status: Secondary | ICD-10-CM

## 2023-10-08 DIAGNOSIS — Z23 Encounter for immunization: Secondary | ICD-10-CM

## 2023-10-08 DIAGNOSIS — Z7901 Long term (current) use of anticoagulants: Secondary | ICD-10-CM

## 2023-10-08 DIAGNOSIS — Z5181 Encounter for therapeutic drug level monitoring: Secondary | ICD-10-CM

## 2023-10-08 MED ORDER — CLOTRIMAZOLE-BETAMETHASONE 1-0.05 % EX CREA
1.0000 | TOPICAL_CREAM | Freq: Every day | CUTANEOUS | 1 refills | Status: AC
Start: 2023-10-08 — End: ?

## 2023-10-08 NOTE — Assessment & Plan Note (Signed)
 BP Readings from Last 1 Encounters:  10/08/23 124/65   Well-controlled with Losartan  25 mg QD Counseled for compliance with the medications Advised DASH diet and moderate exercise/walking, at least 150 mins/week

## 2023-10-08 NOTE — Progress Notes (Signed)
 Established Patient Office Visit  Subjective:  Patient ID: [name]., male    DOB: [birth year]  Age: 78 y.o. MRN: [id number]  CC:  Chief Complaint  Patient presents with   Follow-up   Rash    Reports rash on his left leg.     HPI [name]. is a 78 y.o. male with past medical history of CAD s/p stent placement, DVT/PE in 2009 -on Coumadin , erectile dysfunction, avascular necrosis of hip s/p THA, HLD and colonic polyps who presents for f/u of his chronic medical conditions.  HTN: BP is well-controlled. Takes medications regularly. Patient denies headache, dizziness, chest pain, dyspnea or palpitations.  HLD: His Zetia  was discontinued due to diarrhea. He was placed on Repatha , but had severe diarrhea and GI discomfort with it.  He has statin intolerance.  He had DVT/PE in 2009, and has been taking Coumadin  since then. He still wants to continue taking Coumadin  for now.  He does have easy bruising.  Denies any active bleeding currently.  He gets INR checked every month or so.  She reports chronic, intermittent cramps in arms and legs.  He has tried mustard with adequate relief.  He reports an itching rash on the left leg near ankle area, which is chronic and flares up at times.  Denies any recent insect bite or injury.     Past Medical History:  Diagnosis Date   Anticoagulated on Coumadin     Avascular necrosis of hip (HCC)    LEFT   DVT (deep venous thrombosis) (HCC) 2009   Dyslipidemia, goal LDL below 70 04/09/2017   Family history of anesthesia complication    BROTHER HAD MALIGNANT HYPOTHERMIA - PT HAS NOT HAD ANY PROBLEMS WITH ANESTHESIA     History of kidney stones    Malignant hyperthermia    PT'S BROTHER HAS HX MALIGNANT HYPERTHERMIA   NSTEMI (non-ST elevated myocardial infarction) (HCC) 04/09/2017   Pulmonary embolism (HCC) 2009   Vertigo    NO RECENT PROBLEMS    Past Surgical History:  Procedure Laterality Date   BACK SURGERY  2009   DISCECTOMY    CATARACT EXTRACTION, BILATERAL     CERVICAL FUSION  2008   COLONOSCOPY  2005   Dr. Dyane: normal   COLONOSCOPY  2010   Dr. Shaaron, +Polyps but no path available. Surveillance 5 years   COLONOSCOPY  2016   Dr. Dyane   COLONOSCOPY WITH PROPOFOL  N/A 01/15/2020   Procedure: COLONOSCOPY WITH PROPOFOL ;  Surgeon: Shaaron Lamar HERO, MD;  Location: [facility];  Service: Endoscopy;  Laterality: N/A;  10:15am   CORONARY STENT INTERVENTION N/A 04/09/2017   Procedure: CORONARY STENT INTERVENTION;  Surgeon: Mady Bruckner, MD;  Location: [facility];  Service: Cardiovascular;  Laterality: N/A;   CORONARY STENT INTERVENTION N/A 04/12/2017   Procedure: CORONARY STENT INTERVENTION;  Surgeon: Mady Bruckner, MD;  Location: [facility];  Service: Cardiovascular;  Laterality: N/A;   CORONARY ULTRASOUND/IVUS N/A 04/12/2017   Procedure: Intravascular Ultrasound/IVUS;  Surgeon: Mady Bruckner, MD;  Location: [facility];  Service: Cardiovascular;  Laterality: N/A;   LEFT HEART CATH AND CORONARY ANGIOGRAPHY N/A 04/09/2017   Procedure: LEFT HEART CATH AND CORONARY ANGIOGRAPHY;  Surgeon: Mady Bruckner, MD;  Location: [facility];  Service: Cardiovascular;  Laterality: N/A;   TOTAL HIP ARTHROPLASTY Right 12/22/2013   Procedure: RIGHT TOTAL HIP ARTHROPLASTY ANTERIOR APPROACH;  Surgeon: Donnice JONETTA Car, MD;  Location: [facility];  Service: Orthopedics;  Laterality: Right;   TOTAL HIP ARTHROPLASTY Left 04/30/2015   Procedure: LEFT TOTAL HIP ARTHROPLASTY ANTERIOR APPROACH;  Surgeon: Donnice Car, MD;  Location: [facility];  Service: Orthopedics;  Laterality: Left;    Family History  Problem Relation Age of Onset   Breast cancer Mother    Hypertension Mother    Hyperlipidemia Mother    Heart attack Mother 44   Stroke Mother    Cirrhosis Sister    Cancer Brother    Malignant hyperthermia Brother    Hypertension Paternal Grandmother    Diabetes Paternal Grandmother    Colon cancer  Neg Hx     Social History   Socioeconomic History   Marital status: Married    Spouse name: Not on file   Number of children: Not on file   Years of education: Not on file   Highest education level: Not on file  Occupational History   Occupation: self employed, Fish farm manager  Tobacco Use   Smoking status: Never   Smokeless tobacco: Never  Vaping Use   Vaping status: Never Used  Substance and Sexual Activity   Alcohol use: No   Drug use: No   Sexual activity: Not on file  Other Topics Concern   Not on file  Social History Narrative   Right handed    Retired but still working    Social Drivers of Corporate investment banker Strain: Not on Ship broker Insecurity: Not on file  Transportation Needs: Not on file  Physical Activity: Not on file  Stress: Not on file  Social Connections: Not on file  Intimate Partner Violence: Not on file    Outpatient Medications Prior to Visit  Medication Sig Dispense Refill   acetaminophen  (TYLENOL ) 500 MG tablet Take 2 tablets (1,000 mg total) by mouth every 8 (eight) hours. (Patient taking differently: Take 1,000 mg by mouth every 8 (eight) hours as needed (pain/headaches.).) 30 tablet 0   dorzolamide (TRUSOPT) 2 % ophthalmic solution Place 1 drop into the right eye in the morning and at bedtime.     JANTOVEN  5 MG tablet TAKE ONE TABLET BY MOUTH DAILY 90 tablet 1   losartan  (COZAAR ) 25 MG tablet Take 1 tablet (25 mg total) by mouth daily. 90 tablet 3   nitroGLYCERIN  (NITROSTAT ) 0.4 MG SL tablet 1 TAB UNDER TONGUE EVERY 5 MINUTES X3 DOSES AS NEED CHEST PAIN. IF NO RELIEF AFTER 1ST DOSE GO TO ER 25 tablet 3   sildenafil  (REVATIO ) 20 MG tablet TAKE 2 TO 3 TABLETS BY MOUTH AS NEEDED 30 MINUTES BEFORE INTERCOURSE Strength: 20 mg 90 tablet 1   clotrimazole -betamethasone  (LOTRISONE ) cream Apply 1 Application topically daily. 30 g 0   No facility-administered medications prior to visit.    Allergies  Allergen Reactions   Atorvastatin       myalgias   Lyrica [Pregabalin]     went out of head   Pravastatin      myalgias    ROS Review of Systems  Constitutional:  Negative for chills and fever.  HENT:  Negative for congestion and sore throat.   Eyes:  Negative for pain and discharge.  Respiratory:  Negative for cough and shortness of breath.   Cardiovascular:  Negative for chest pain and palpitations.  Gastrointestinal:  Negative for constipation, diarrhea, nausea and vomiting.  Endocrine: Negative for polydipsia and polyuria.  Genitourinary:  Negative for dysuria and hematuria.  Musculoskeletal:  Negative for neck pain and neck stiffness.  Skin:  Positive for rash.  Neurological:  Negative for dizziness, weakness, numbness and headaches.  Hematological:  Bruises/bleeds easily.  Psychiatric/Behavioral:  Negative for agitation and behavioral problems.       Objective:    Physical Exam Vitals reviewed.  Constitutional:      General: He is not in acute distress.    Appearance: He is not diaphoretic.  HENT:     Head: Normocephalic and atraumatic.     Nose: No congestion.     Mouth/Throat:     Mouth: Mucous membranes are moist.  Eyes:     General: No scleral icterus.    Extraocular Movements: Extraocular movements intact.  Cardiovascular:     Rate and Rhythm: Normal rate and regular rhythm.     Heart sounds: Normal heart sounds. No murmur heard. Pulmonary:     Breath sounds: Normal breath sounds. No wheezing or rales.  Musculoskeletal:     Cervical back: Neck supple. No tenderness.     Right lower leg: No edema.     Left lower leg: No edema.  Skin:    General: Skin is warm.     Findings: Rash (Brownish patches over medial side of left lower leg, about 2 cm x 3 cm) present.  Neurological:     General: No focal deficit present.     Mental Status: He is alert and oriented to person, place, and time.  Psychiatric:        Mood and Affect: Mood normal.        Behavior: Behavior normal.     BP 124/65    Pulse 70   Ht 5' 8 (1.727 m)   Wt 176 lb 9.6 oz (80.1 kg)   SpO2 97%   BMI 26.85 kg/m  Wt Readings from Last 3 Encounters:  10/08/23 176 lb 9.6 oz (80.1 kg)  07/26/23 176 lb 9.6 oz (80.1 kg)  04/16/23 175 lb 12.8 oz (79.7 kg)    Lab Results  Component Value Date   TSH 3.140 04/16/2023   Lab Results  Component Value Date   WBC 4.8 04/16/2023   HGB 13.7 04/16/2023   HCT 42.1 04/16/2023   MCV 91 04/16/2023   PLT 215 04/16/2023   Lab Results  Component Value Date   NA 142 04/16/2023   K 4.0 04/16/2023   CO2 25 04/16/2023   GLUCOSE 106 (H) 04/16/2023   BUN 14 04/16/2023   CREATININE 1.39 (H) 04/16/2023   BILITOT 0.6 04/16/2023   ALKPHOS 75 04/16/2023   AST 23 04/16/2023   ALT 21 04/16/2023   PROT 6.7 04/16/2023   ALBUMIN 4.3 04/16/2023   CALCIUM  8.9 04/16/2023   ANIONGAP 10 06/25/2022   EGFR 53 (L) 04/16/2023   Lab Results  Component Value Date   CHOL 177 04/16/2023   Lab Results  Component Value Date   HDL 33 (L) 04/16/2023   Lab Results  Component Value Date   LDLCALC 125 (H) 04/16/2023   Lab Results  Component Value Date   TRIG 105 04/16/2023   Lab Results  Component Value Date   CHOLHDL 5.4 (H) 04/16/2023   Lab Results  Component Value Date   HGBA1C 5.9 (H) 04/16/2023      Assessment & Plan:   Problem List Items Addressed This Visit       Cardiovascular and Mediastinum   CAD S/P percutaneous coronary angioplasty   On Coumadin  due to history of DVT and PE Did not tolerate statin or Zetia  in the past Did not tolerate Repatha  as well  Followed by Cardiology      Essential hypertension - Primary   BP Readings from Last 1 Encounters:  10/08/23 124/65   Well-controlled with Losartan  25 mg QD Counseled for compliance with the medications Advised DASH diet and moderate exercise/walking, at least 150 mins/week      Relevant Orders   CMP14+EGFR   INR/PT   CBC with Differential/Platelet     Musculoskeletal and Integument   Tinea  corporis   Rash on left leg likely tinea, has had recurrent rash Started Lotrisone  cream      Relevant Medications   clotrimazole -betamethasone  (LOTRISONE ) cream     Other   History of pulmonary embolism (Chronic)   In 2009 Unclear if provoked or unprovoked, but has been taking Coumadin  since 2009 Had limited mobility in 2009 due to severe back pain, which could be provoking factor. Has had coagulation work-up in the past, had Hematology evaluation in the past as well, recommended indefinite AC - continue Coumadin  5 mg QD Will continue to check INR with goal between 2-3.      Relevant Orders   INR/PT   Prediabetes   Lab Results  Component Value Date   HGBA1C 5.9 (H) 04/16/2023   Advised to follow low carb diet      Relevant Orders   CMP14+EGFR   Other Visit Diagnoses       Encounter for immunization       Relevant Orders   Tdap vaccine greater than or equal to 7yo IM (Completed)     Encounter for monitoring warfarin therapy       Relevant Orders   INR/PT          Meds ordered this encounter  Medications   clotrimazole -betamethasone  (LOTRISONE ) cream    Sig: Apply 1 Application topically daily.    Dispense:  30 g    Refill:  1    Follow-up: Return in about 6 months (around 04/09/2024) for Annual physical (after 04/15/24).    Suzzane MARLA Blanch, MD

## 2023-10-08 NOTE — Assessment & Plan Note (Signed)
 Rash on left leg likely tinea, has had recurrent rash Started Lotrisone  cream

## 2023-10-08 NOTE — Assessment & Plan Note (Signed)
 Lab Results  Component Value Date   HGBA1C 5.9 (H) 04/16/2023   Advised to follow low carb diet

## 2023-10-08 NOTE — Assessment & Plan Note (Signed)
 In 2009 Unclear if provoked or unprovoked, but has been taking Coumadin since 2009 Had limited mobility in 2009 due to severe back pain, which could be provoking factor. Has had coagulation work-up in the past, had Hematology evaluation in the past as well, recommended indefinite AC - continue Coumadin 5 mg QD Will continue to check INR with goal between 2-3.

## 2023-10-08 NOTE — Patient Instructions (Signed)
 Please apply Lotrisone  cream over left leg rash.  Please continue to take medications as prescribed.  Please continue to follow low salt diet and perform moderate exercise/walking at least 150 mins/week.  Please get blood tests done after a month with your routine INR check.

## 2023-10-08 NOTE — Assessment & Plan Note (Addendum)
 On Coumadin  due to history of DVT and PE Did not tolerate statin or Zetia  in the past Did not tolerate Repatha  as well Followed by Cardiology

## 2023-10-11 DIAGNOSIS — H903 Sensorineural hearing loss, bilateral: Secondary | ICD-10-CM | POA: Diagnosis not present

## 2023-10-15 ENCOUNTER — Ambulatory Visit: Payer: BC Managed Care – PPO | Admitting: Internal Medicine

## 2023-11-03 DIAGNOSIS — R791 Abnormal coagulation profile: Secondary | ICD-10-CM | POA: Diagnosis not present

## 2023-11-03 DIAGNOSIS — R7303 Prediabetes: Secondary | ICD-10-CM | POA: Diagnosis not present

## 2023-11-04 ENCOUNTER — Ambulatory Visit: Payer: Self-pay | Admitting: Internal Medicine

## 2023-11-04 LAB — CMP14+EGFR
ALT: 22 IU/L (ref 0–44)
AST: 23 IU/L (ref 0–40)
Albumin: 4.1 g/dL (ref 3.8–4.8)
Alkaline Phosphatase: 68 IU/L (ref 44–121)
BUN/Creatinine Ratio: 10 (ref 10–24)
BUN: 13 mg/dL (ref 8–27)
Bilirubin Total: 0.4 mg/dL (ref 0.0–1.2)
CO2: 22 mmol/L (ref 20–29)
Calcium: 8.7 mg/dL (ref 8.6–10.2)
Chloride: 103 mmol/L (ref 96–106)
Creatinine, Ser: 1.32 mg/dL — ABNORMAL HIGH (ref 0.76–1.27)
Globulin, Total: 2.4 g/dL (ref 1.5–4.5)
Glucose: 92 mg/dL (ref 70–99)
Potassium: 4.1 mmol/L (ref 3.5–5.2)
Sodium: 139 mmol/L (ref 134–144)
Total Protein: 6.5 g/dL (ref 6.0–8.5)
eGFR: 56 mL/min/1.73 — ABNORMAL LOW (ref >59)

## 2023-11-04 LAB — CBC WITH DIFFERENTIAL/PLATELET
Basophils Absolute: 0 x10E3/uL (ref 0.0–0.2)
Basos: 1 %
EOS (ABSOLUTE): 0.2 x10E3/uL (ref 0.0–0.4)
Eos: 3 %
Hematocrit: 42.6 % (ref 37.5–51.0)
Hemoglobin: 13.8 g/dL (ref 13.0–17.7)
Immature Grans (Abs): 0 x10E3/uL (ref 0.0–0.1)
Immature Granulocytes: 0 %
Lymphocytes Absolute: 1.9 x10E3/uL (ref 0.7–3.1)
Lymphs: 35 %
MCH: 29.9 pg (ref 26.6–33.0)
MCHC: 32.4 g/dL (ref 31.5–35.7)
MCV: 92 fL (ref 79–97)
Monocytes Absolute: 0.6 x10E3/uL (ref 0.1–0.9)
Monocytes: 11 %
Neutrophils Absolute: 2.7 x10E3/uL (ref 1.4–7.0)
Neutrophils: 49 %
Platelets: 194 x10E3/uL (ref 150–450)
RBC: 4.62 x10E6/uL (ref 4.14–5.80)
RDW: 13 % (ref 11.6–15.4)
WBC: 5.4 x10E3/uL (ref 3.4–10.8)

## 2023-11-04 LAB — PROTIME-INR
INR: 2.1 — ABNORMAL HIGH (ref 0.9–1.2)
Prothrombin Time: 22.2 s — ABNORMAL HIGH (ref 9.1–12.0)

## 2023-12-29 ENCOUNTER — Other Ambulatory Visit: Payer: Self-pay | Admitting: Internal Medicine

## 2023-12-31 ENCOUNTER — Other Ambulatory Visit: Payer: Self-pay | Admitting: Internal Medicine

## 2023-12-31 ENCOUNTER — Other Ambulatory Visit: Payer: Self-pay

## 2023-12-31 DIAGNOSIS — Z7901 Long term (current) use of anticoagulants: Secondary | ICD-10-CM

## 2024-01-01 LAB — PROTIME-INR
INR: 2.7 — ABNORMAL HIGH (ref 0.9–1.2)
Prothrombin Time: 27.5 s — ABNORMAL HIGH (ref 9.1–12.0)

## 2024-01-03 ENCOUNTER — Ambulatory Visit: Payer: Self-pay | Admitting: Internal Medicine

## 2024-01-03 DIAGNOSIS — H401213 Low-tension glaucoma, right eye, severe stage: Secondary | ICD-10-CM | POA: Diagnosis not present

## 2024-01-24 DIAGNOSIS — L821 Other seborrheic keratosis: Secondary | ICD-10-CM | POA: Diagnosis not present

## 2024-01-24 DIAGNOSIS — Z1283 Encounter for screening for malignant neoplasm of skin: Secondary | ICD-10-CM | POA: Diagnosis not present

## 2024-01-24 DIAGNOSIS — L820 Inflamed seborrheic keratosis: Secondary | ICD-10-CM | POA: Diagnosis not present

## 2024-01-24 DIAGNOSIS — D225 Melanocytic nevi of trunk: Secondary | ICD-10-CM | POA: Diagnosis not present

## 2024-03-14 ENCOUNTER — Other Ambulatory Visit: Payer: Self-pay | Admitting: Internal Medicine

## 2024-03-14 DIAGNOSIS — Z9229 Personal history of other drug therapy: Secondary | ICD-10-CM

## 2024-03-14 DIAGNOSIS — I8290 Acute embolism and thrombosis of unspecified vein: Secondary | ICD-10-CM

## 2024-03-14 DIAGNOSIS — Z7901 Long term (current) use of anticoagulants: Secondary | ICD-10-CM

## 2024-03-14 DIAGNOSIS — Z5181 Encounter for therapeutic drug level monitoring: Secondary | ICD-10-CM

## 2024-03-14 DIAGNOSIS — R791 Abnormal coagulation profile: Secondary | ICD-10-CM

## 2024-03-15 ENCOUNTER — Ambulatory Visit: Payer: Self-pay | Admitting: Internal Medicine

## 2024-03-15 LAB — PROTIME-INR
INR: 2.6 — ABNORMAL HIGH (ref 0.9–1.2)
Prothrombin Time: 26.1 s — ABNORMAL HIGH (ref 9.1–12.0)

## 2024-04-14 ENCOUNTER — Encounter: Admitting: Internal Medicine

## 2024-05-26 ENCOUNTER — Encounter: Payer: Self-pay | Admitting: Internal Medicine
